# Patient Record
Sex: Male | Born: 1945 | Race: White | Hispanic: No | Smoking: Never smoker
Health system: Southern US, Community
[De-identification: ages and names within clinical notes are randomized; demographics above are authoritative.]

## PROBLEM LIST (undated history)

## (undated) DIAGNOSIS — Z950 Presence of cardiac pacemaker: Secondary | ICD-10-CM

## (undated) DIAGNOSIS — Z85828 Personal history of other malignant neoplasm of skin: Secondary | ICD-10-CM

## (undated) DIAGNOSIS — L299 Pruritus, unspecified: Secondary | ICD-10-CM

## (undated) DIAGNOSIS — H35039 Hypertensive retinopathy, unspecified eye: Secondary | ICD-10-CM

## (undated) DIAGNOSIS — E039 Hypothyroidism, unspecified: Secondary | ICD-10-CM

## (undated) DIAGNOSIS — B86 Scabies: Secondary | ICD-10-CM

## (undated) DIAGNOSIS — H269 Unspecified cataract: Secondary | ICD-10-CM

## (undated) HISTORY — DX: Hypothyroidism, unspecified: E03.9

## (undated) HISTORY — DX: Scabies: B86

## (undated) HISTORY — DX: Unspecified cataract: H26.9

## (undated) HISTORY — DX: Hypertensive retinopathy, unspecified eye: H35.039

## (undated) HISTORY — DX: Presence of cardiac pacemaker: Z95.0

## (undated) HISTORY — DX: Personal history of other malignant neoplasm of skin: Z85.828

## (undated) HISTORY — DX: Pruritus, unspecified: L29.9

---

## 2017-04-29 ENCOUNTER — Ambulatory Visit (INDEPENDENT_AMBULATORY_CARE_PROVIDER_SITE_OTHER): Payer: Medicare Other | Admitting: Student in an Organized Health Care Education/Training Program

## 2017-04-29 ENCOUNTER — Encounter: Payer: Self-pay | Admitting: Student in an Organized Health Care Education/Training Program

## 2017-04-29 VITALS — BP 98/68 | HR 96 | Temp 97.9°F | Ht 75.5 in | Wt 200.4 lb

## 2017-04-29 DIAGNOSIS — L299 Pruritus, unspecified: Secondary | ICD-10-CM

## 2017-04-29 DIAGNOSIS — E039 Hypothyroidism, unspecified: Secondary | ICD-10-CM

## 2017-04-29 DIAGNOSIS — Z85828 Personal history of other malignant neoplasm of skin: Secondary | ICD-10-CM | POA: Diagnosis not present

## 2017-04-29 DIAGNOSIS — Z7689 Persons encountering health services in other specified circumstances: Secondary | ICD-10-CM | POA: Diagnosis present

## 2017-04-29 DIAGNOSIS — Z950 Presence of cardiac pacemaker: Secondary | ICD-10-CM | POA: Diagnosis not present

## 2017-04-29 HISTORY — DX: Personal history of other malignant neoplasm of skin: Z85.828

## 2017-04-29 HISTORY — DX: Pruritus, unspecified: L29.9

## 2017-04-29 HISTORY — DX: Hypothyroidism, unspecified: E03.9

## 2017-04-29 HISTORY — DX: Presence of cardiac pacemaker: Z95.0

## 2017-04-29 MED ORDER — CETIRIZINE HCL 10 MG PO TABS: 10.0000 mg | ORAL_TABLET | Freq: Every day | ORAL | 11 refills | Status: DC

## 2017-04-29 NOTE — Assessment & Plan Note (Signed)
Patient follows with cardiology, now that he has moved it is reasonable to put in a referral so that he can establish care in [place].

## 2017-04-29 NOTE — Assessment & Plan Note (Signed)
Patient follows with dermatology, reasonable to establish care here in [place]. Referral was placed today.

## 2017-04-29 NOTE — Patient Instructions (Signed)
It was a pleasure seeing you today in our clinic. Today we discussed your medical history and generalized itching. Here is the treatment plan we have discussed and agreed upon together:  Your medications were entered into our system so please reach out when you need refills  A consult was placed to cardiology and dermatology at today's visit.  You will receive a call to schedule an appointment. If you do not receive a call within two weeks please call our office so we can place the consult again.  For itching, please avoid any products with a strong scent. Keep your skin hydrated with gentle unscented moisturizer such as cetaphil. I sent an antihistamine called Zyrtec to your pharmacy that may help with itching.  Our clinic's number is [phone]. Please call with questions or concerns about what we discussed today.  Be well, Dr. Mosetta PuttFeng

## 2017-04-29 NOTE — Assessment & Plan Note (Signed)
Uncertain etiology, may be dry skin or exposure to new city with new allergens. No scaling, no evidence of scabies. - Recommend unscented moisturizing lotion - Ordered Zyrtec to be sent to his pharmacy - Avoid strongly scented detergents, soaps, lotions which may be irritating to his skin - Follow-up as needed

## 2017-04-29 NOTE — Progress Notes (Signed)
CC: generalized itching  HPI: [name] [name] is a 71 y.o. male who presents to [facility] today as a new patient.  Patient notes generalized itching. It is primarily in his torso as well as his legs. He does not notice any rash on his skin. The itch is worse at night. He does not notice it during the day when he is up and walking around, and less he thinks about it. Currently has itching on his legs, but does not note any rash. This is been going on for 1 month. No notable new exposures, but he did [place] move to [place]. Denies changing detergents, lotions, soaps.   PMH: Skin cancer 2018 on right forehead >> follows with dermatology, requesting referral in the area Irregular heart beat with pacemaker in 2010 >> follows with cardiology once per year, last went 9 months ago requesting cardiology referral in the area HTN diagnosed in 2000   PSH: - Appendectomy 2005 - Pacemaker placed 2010  Family Hx: Mom and dad both had pancreatic cancer Grandparents with heart disease Brother with HTN  Social Hx: Retired Lives at home with his Dog called Romeo HCPOA Jairo BenJulie Ore >> daughter Exercises by walking and doing yoga Never smoker Denies Recreational drug use Drinks one drink per day, beer, wine or liquor Feels safe in relationships  Allergies: Penicillins and sulfa antibiotics   Meds: Pravastatin 40 mg daily Trospium Chloride 60 mg  Sertraline Hcl 150 mg daily ASA 81 mg BID Levothyroxine 100 mcg daily Vitamin D3 1000 IU daily Fish oil 1000 BID Multivitamin 1 daily Melatonin 10 mg daily  Overdue health maintenance  Reports Colonoscopy in 2008, normal Shingles vaccine in 2010 Last heart stress test and echo in 2010  Review of Symptoms:  See HPI for ROS.   CC, SH/smoking status, and VS noted.  Objective: BP 98/68   Pulse 96   Temp 97.9 F (36.6 C) (Oral)   Ht 6' 3.5" (1.918 m)   Wt 200 lb 6.4 oz (90.9 kg)   SpO2 96%   BMI 24.72 kg/m  GEN: NAD, alert, cooperative,  and pleasant. EYE: no conjunctival injection, pupils equally round and reactive to light ENMT: normal tympanic light reflex, no nasal polyps,no rhinorrhea, no pharyngeal erythema or exudates NECK: full ROM, no thyromegaly RESPIRATORY: clear to auscultation bilaterally with no wheezes, rhonchi or rales, good effort CV: RRR, no m/r/g, no peripheral edema GI: soft, non-tender, non-distended, no hepatosplenomegaly SKIN: warm and dry, no rashes or lesions NEURO: II-XII grossly intact, normal gait, peripheral sensation intact PSYCH: AAOx3, appropriate affect  Assessment and plan:  Itching Uncertain etiology, may be dry skin or exposure to new city with new allergens. No scaling, no evidence of scabies. - Recommend unscented moisturizing lotion - Ordered Zyrtec to be sent to his pharmacy - Avoid strongly scented detergents, soaps, lotions which may be irritating to his skin - Follow-up as needed  Cardiac pacemaker in situ Patient follows with cardiology, now that he has moved it is reasonable to put in a referral so that he can establish care in [place].  History of skin cancer Patient follows with dermatology, reasonable to establish care here in [place]. Referral was placed today.  Hypothyroidism Uncertain labs were previously done. Continue current dose of levothyroxine, and we'll try to obtain records from previous PCP.   Orders Placed This Encounter  Procedures  . Ambulatory referral to Cardiology    Referral Priority:   Routine    Referral Type:   Consultation  Referral Reason:   Specialty Services Required    Requested Specialty:   Cardiology    Number of Visits Requested:   1  . Ambulatory referral to Dermatology    Referral Priority:   Routine    Referral Type:   Consultation    Referral Reason:   Specialty Services Required    Requested Specialty:   Dermatology    Number of Visits Requested:   1    Meds ordered this encounter  Medications  . levothyroxine  (SYNTHROID, LEVOTHROID) 100 MCG tablet    Sig: Take 100 mcg daily by mouth.  . pravastatin (PRAVACHOL) 40 MG tablet    Sig: Take 40 mg daily by mouth.    Refill:  1  . sertraline (ZOLOFT) 100 MG tablet    Sig: Take 100 mg daily by mouth.  . Trospium Chloride 60 MG CP24    Sig: Take 1 capsule daily by mouth.    Refill:  2  . cetirizine (ZYRTEC) 10 MG tablet    Sig: Take 1 tablet (10 mg total) daily by mouth.    Dispense:  30 tablet    Refill:  11    Howard PouchLauren Latiesha Harada, MD,MS,  PGY2 04/29/2017 10:58 AM

## 2017-04-29 NOTE — Assessment & Plan Note (Signed)
Uncertain labs were previously done. Continue current dose of levothyroxine, and we'll try to obtain records from previous PCP.

## 2017-05-02 ENCOUNTER — Telehealth: Payer: Self-pay | Admitting: Student in an Organized Health Care Education/Training Program

## 2017-05-02 NOTE — Telephone Encounter (Signed)
Patient came to office was given RX for itching on last visit. Patient stated it has not helped & the itching is getting worse. Can something else be called in. [facility] on [address]. Please let patient know. [phone]

## 2017-05-05 NOTE — Telephone Encounter (Signed)
Diflucan sent [facility] for patient to pick up.

## 2017-05-06 NOTE — Telephone Encounter (Signed)
LVM for pt to call the office. If pt calls, please inform him of the message below. Sunday SpillersSharon T Saunders, CMA

## 2017-05-08 NOTE — Telephone Encounter (Signed)
I don't see where med were sent to pharmacy.  Will forward back to MD because when I called patient, he request that the meds be sent to [facility] on [address]. Gailya Tauer, Maryjo RochesterJessica Dawn, CMA

## 2017-05-08 NOTE — Telephone Encounter (Signed)
Previous note about diflucan was entered in error. Patient did not have a rash at all when seen in the office, so if allergy medication is not improving his symptoms he likely needs to be seen in clinic for worsening itchiness.

## 2017-05-13 NOTE — Telephone Encounter (Signed)
LVM for pt to call back to inform him that the medication that he was would be sent to pharmacy was entered in error and that if the allergy medication he was prescribed was not helping then he would need to make an appointment for worsening itchiness per Dr. Mosetta PuttFeng.  If he states that he needs an appointment please assist him scheduling this. [name] [name], April D, [place]

## 2017-05-14 NOTE — Telephone Encounter (Signed)
It appears that pt has an appointment scheduled for this on 05/20/17. [name] [name], [name] [name], [name] [name]

## 2017-05-20 ENCOUNTER — Ambulatory Visit (INDEPENDENT_AMBULATORY_CARE_PROVIDER_SITE_OTHER): Payer: Medicare Other | Admitting: Student in an Organized Health Care Education/Training Program

## 2017-05-20 VITALS — BP 100/70 | HR 70 | Temp 97.9°F | Wt 201.2 lb

## 2017-05-20 DIAGNOSIS — R21 Rash and other nonspecific skin eruption: Secondary | ICD-10-CM

## 2017-05-20 DIAGNOSIS — B86 Scabies: Secondary | ICD-10-CM | POA: Diagnosis not present

## 2017-05-20 MED ORDER — PERMETHRIN 5 % EX CREA: 1.0000 "application " | TOPICAL_CREAM | Freq: Once | CUTANEOUS | 0 refills | Status: AC

## 2017-05-20 MED ORDER — TRIAMCINOLONE ACETONIDE 0.1 % EX OINT: 1.0000 "application " | TOPICAL_OINTMENT | Freq: Two times a day (BID) | CUTANEOUS | 0 refills | Status: DC

## 2017-05-20 NOTE — Patient Instructions (Signed)
It was a pleasure seeing you today in our clinic. Today we discussed your itchy rash. Here is the treatment plan we have discussed and agreed upon together:  We will do a course of permethrin. You put the lotion all over your body from the neck down before bed. You wash it off in the morning. You can repeat the treatment in 7 days if symptoms persist.  Please wash your clothes and bed clothes. Items that cannot go into the wash must be placed in a garbage bag and set aside for 7 days so the mites will die.  If your symptoms persist let my office know and I will send an ointment for treating itchy skin. I would start by treating the possible scabies.  Our clinic's number is [phone]. Please call with questions or concerns about what we discussed today.  Be well, Dr. Mosetta PuttFeng   What is scabies? - Scabies is a condition that makes your skin very itchy. It happens when tiny insects called mites burrow under your skin to lay their eggs. How did I get scabies? - You probably caught scabies from someone else who has it. The condition spreads easily between people who are in close contact. It is also possible to catch scabies from the clothes of someone with the condition. Are there symptoms besides itching? - Yes. The main symptom is itching, but there are other symptoms. People with scabies usually get little red bumps or blisters on their skin (picture 1). Sometimes the bumps are hard to see. Some people even notice tiny tunnels in their skin where the mites have buried themselves. These are the body parts that are most often affected by scabies (figure 1): ?The fingers and webbing between the fingers ?The skin folds around the wrists, elbows, and knees ?The armpits ?The area around the nipples (especially in women) ?The waist ?The penis and scrotum (in men) ?The lower buttocks and upper thighs ?The sides and bottoms of the feet Should I see a doctor or nurse? - Yes, see your doctor or nurse. If  you have scabies, he or she can give you medicine to get rid of it. You can get infections if you keep scratching at it. How is scabies treated? - Your doctor or nurse can give you a prescription for a medicine that kills the mites that cause scabies. A medicine that is often used is called "permethrin" (brand names: Elimite, Acticin). Permethrin comes in a cream or lotion that you put on your skin. You must use the medicine exactly how the doctor or nurse tells you to. Otherwise it might not work. A medicine that comes in a pill, called "ivermectin," can also cure scabies. You need a prescription for this medicine. If you are being treated for scabies, the doctor or nurse will probably want the people who live with you to be treated, too. They might be carrying the mite that causes scabies, even if they have no symptoms. When you start treatment, wash all the clothes you and others in your home wore in the last 4 to 5 days in hot water. Then dry them in a dryer on high heat. You should also wash any bedclothes (sheets and blankets) or towels people in your home have touched. If your child is getting treated, wash his or her stuffed animals, too. Dry-cleaning will also get rid of the scabies mite. Any bedding, clothing, or towels that you cannot wash or dry-clean should be placed in a sealed plastic bag for  at least 3 days. Scabies mites usually die without contact with human skin after a few days. What can I do to stop the itching? - You can take medicines called antihistamines. These are the medicines people often take for allergies. Itching can last for several weeks, even after the scabies mite is gone. Your doctor or nurse might suggest you use a cream with a medicine called a steroid to help with the itching. (These are not the same as the steroids some athletes take illegally.) These steroids relieve itching and swelling. When can my child go back to school? - Your child can go back to school after  one day of treatment. Some people get a severe kind of scabies - People with HIV or AIDS, cancer, or other conditions can get a severe type of scabies called "crusted scabies." Crusted scabies causes large, crusty red patches or bumps to form on the skin. It spreads more easily than regular scabies. People with crusted scabies often get it on their head, hands, and feet. They sometimes need to take pills to get rid of scabies. More on this topic Patient education: Scabies (Beyond the Basics) All topics are updated as new evidence becomes available and our peer review process is complete.  This topic retrieved from UpToDate on: May 20, 2017.

## 2017-05-28 ENCOUNTER — Encounter: Payer: Self-pay | Admitting: Student in an Organized Health Care Education/Training Program

## 2017-05-28 DIAGNOSIS — B86 Scabies: Secondary | ICD-10-CM

## 2017-05-28 HISTORY — DX: Scabies: B86

## 2017-05-28 NOTE — Progress Notes (Signed)
   CC: pruritic rash  HPI: [name] [name] is a 71 y.o. male presenting with itchy rash.  Patient was seen in clinic previously for generalized itching of his skin, however no rash was appreciated at that time. He was thought to have allergies (recently moved to this area) and was treated with antihistamines. His pruritis persisted, and recently he noticed development of a rash prompting him to come back in for evaluation.  Today he is noted to have a puriritic rash with excoriations and borrows over his forearms and wrists. He denies changes in detergents, soaps or lotions. He denies any known new exposures such as medications. He denies recent hotel stays.  Review of Symptoms:  See HPI for ROS.   CC, SH/smoking status, and VS noted.  Objective: BP 100/70 (BP Location: Right Arm, Patient Position: Sitting, Cuff Size: Normal)   Pulse 70   Temp 97.9 F (36.6 C) (Oral)   Wt 201 lb 3.2 oz (91.3 kg)   SpO2 97%   BMI 24.82 kg/m  GEN: NAD, alert, cooperative, and pleasant. SKIN: Multiple excoriations with scabbing noted over forearms and wrists, not on trunk. Not on legs. Not on face.  Assessment and plan:  Scabies - Permetrhin - Kenolog ointment PRN itching - advised on appropriate washing of bed linens and clothes - advised against going to his massage today due to concern for infectivity - follow up if symptoms worsen or fail to improve - return precautions advised   No orders of the defined types were placed in this encounter.   Meds ordered this encounter  Medications  . permethrin (ELIMITE) 5 % cream    Sig: Apply 1 application topically once for 1 dose. Repeat in 7 days if symptoms persist.    Dispense:  60 g    Refill:  0  . triamcinolone ointment (KENALOG) 0.1 %    Sig: Apply 1 application topically 2 (two) times daily. As needed for itchy skin    Dispense:  15 g    Refill:  0     Howard PouchLauren Yazmyne Sara, MD,MS,  PGY2 05/28/2017 1:37 PM

## 2017-05-28 NOTE — Assessment & Plan Note (Signed)
-   Permetrhin - Kenolog ointment PRN itching - advised on appropriate washing of bed linens and clothes - advised against going to his massage today due to concern for infectivity - follow up if symptoms worsen or fail to improve - return precautions advised

## 2017-06-03 ENCOUNTER — Ambulatory Visit (INDEPENDENT_AMBULATORY_CARE_PROVIDER_SITE_OTHER): Payer: Medicare Other | Admitting: Student in an Organized Health Care Education/Training Program

## 2017-06-03 ENCOUNTER — Other Ambulatory Visit: Payer: Self-pay | Admitting: Student in an Organized Health Care Education/Training Program

## 2017-06-03 ENCOUNTER — Encounter: Payer: Self-pay | Admitting: Student in an Organized Health Care Education/Training Program

## 2017-06-03 ENCOUNTER — Other Ambulatory Visit: Payer: Self-pay

## 2017-06-03 VITALS — BP 120/76 | HR 79 | Temp 98.1°F | Ht 75.5 in | Wt 199.8 lb

## 2017-06-03 DIAGNOSIS — R21 Rash and other nonspecific skin eruption: Secondary | ICD-10-CM | POA: Diagnosis not present

## 2017-06-03 DIAGNOSIS — L299 Pruritus, unspecified: Secondary | ICD-10-CM | POA: Diagnosis not present

## 2017-06-03 DIAGNOSIS — L280 Lichen simplex chronicus: Secondary | ICD-10-CM

## 2017-06-03 MED ORDER — HYDROXYZINE HCL 10 MG PO TABS: 10.0000 mg | ORAL_TABLET | Freq: Every evening | ORAL | 0 refills | Status: DC | PRN

## 2017-06-03 MED ORDER — TRIAMCINOLONE ACETONIDE 0.1 % EX OINT: 1.0000 "application " | TOPICAL_OINTMENT | Freq: Two times a day (BID) | CUTANEOUS | 0 refills | Status: DC

## 2017-06-03 NOTE — Patient Instructions (Addendum)
It was a pleasure seeing you today in our clinic. Today we discussed your itchy rash. Here is the treatment plan we have discussed and agreed upon together:  - Please use Zyrtec during the day -  I prescribed another medication to use as needed for itching called Atarax, to be used at night -  you can use a humidifier to help with itching at nighttime - I sent the ointment to your pharmacy as well which may help with itching   Our clinic's number is [phone]. Please call with questions or concerns about what we discussed today.  Be well, Dr. Mosetta PuttFeng

## 2017-06-03 NOTE — Progress Notes (Signed)
   CC: Pruritic rash  HPI: [name] [name] is a 71 y.o. male with PMH significant for recent treatment for scabies who [facility] with pruritus.  Patient was seen about 2 weeks ago and diagnosed with scabies at which time he was treated with permethrin and Kenalog ointment. Since that time patient has continued to have itching, feels that the Kenalog is not doing anything. The itching is now on his back bilateral arms and left outer upper thigh and right outer upper thigh. Patient notes that he has been very nervous that the scabies is not been eradicated. He has been cleaning everything in his house. He has been using tea tree oil and vinegar on his skin because he read online that this may help get rid of scabies further.   Review of Symptoms:  See HPI for ROS.   CC, SH/smoking status, and VS noted.  Objective: BP 120/76   Pulse 79   Temp 98.1 F (36.7 C) (Oral)   Ht 6' 3.5" (1.918 m)   Wt 199 lb 12.8 oz (90.6 kg)   SpO2 95%   BMI 24.64 kg/m  GEN: NAD, alert, cooperative, and pleasant. SKIN:   +irritation and erythema noted over bilateral forearms, lateral upper legs, and back in areas he is able to reach. No papules or pustules. No drainage. No skin breakdown  Assessment and plan:  Itching Most consistent with neurodermatitis. Seems that patient has been scratching his skin persistently since having scabies and has ongoing fears that he still has scabies. Likely scratching is causing itch/causing rash. Rash is only present in places he can reach. - zyrtec, atarax at night - kenalog ointment - recommend humidified air - will check LFTs, very small chance there is liver cause for generalized itching, though seems unlikely - reassured patient his rash no longer appears to be scabies   Orders Placed This Encounter  Procedures  . Hepatic Function Panel    Meds ordered this encounter  Medications  . hydrOXYzine (ATARAX/VISTARIL) 10 MG tablet    Sig: Take 1 tablet  (10 mg total) by mouth at bedtime as needed for itching.    Dispense:  30 tablet    Refill:  0  . triamcinolone ointment (KENALOG) 0.1 %    Sig: Apply 1 application topically 2 (two) times daily. As needed for itchy skin    Dispense:  15 g    Refill:  0     Howard PouchLauren Mattye Verdone, MD,MS,  PGY2 06/04/2017 9:33 AM

## 2017-06-04 ENCOUNTER — Encounter: Payer: Self-pay | Admitting: Internal Medicine

## 2017-06-04 ENCOUNTER — Encounter: Payer: Self-pay | Admitting: Student in an Organized Health Care Education/Training Program

## 2017-06-04 ENCOUNTER — Telehealth: Payer: Self-pay

## 2017-06-04 ENCOUNTER — Ambulatory Visit (INDEPENDENT_AMBULATORY_CARE_PROVIDER_SITE_OTHER): Payer: Medicare Other | Admitting: Internal Medicine

## 2017-06-04 VITALS — BP 122/68 | HR 60 | Ht 75.0 in | Wt 200.0 lb

## 2017-06-04 DIAGNOSIS — I442 Atrioventricular block, complete: Secondary | ICD-10-CM

## 2017-06-04 DIAGNOSIS — Z950 Presence of cardiac pacemaker: Secondary | ICD-10-CM

## 2017-06-04 LAB — HEPATIC FUNCTION PANEL
ALBUMIN: 4.4 g/dL (ref 3.5–4.8)
ALT: 21 IU/L (ref 0–44)
AST: 18 IU/L (ref 0–40)
Alkaline Phosphatase: 74 IU/L (ref 39–117)
BILIRUBIN TOTAL: 0.3 mg/dL (ref 0.0–1.2)
BILIRUBIN, DIRECT: 0.11 mg/dL (ref 0.00–0.40)
Total Protein: 6.6 g/dL (ref 6.0–8.5)

## 2017-06-04 NOTE — Patient Instructions (Signed)
Medication Instructions:  Your physician recommends that you continue on your current medications as directed. Please refer to the Current Medication list given to you today.  Labwork: None ordered.  Testing/Procedures: None ordered.  Follow-Up: Your physician wants you to follow-up in: one year with Dr. Ladona Ridgelaylor.   You will receive a reminder letter in the mail two months in advance. If you don't receive a letter, please call our office to schedule the follow-up appointment.  Remote monitoring is used to monitor your Pacemaker from home. This monitoring reduces the number of office visits required to check your device to one time per year. It allows us to keep an eye on the functioning of your device to ensure it is working properly. You are scheduled for a device check from home on 09/03/2017. You may send your transmission at any time that day. If you have a wireless device, the transmission will be sent automatically. After your physician reviews your transmission, you will receive a postcard with your next transmission date.    Any Other Special Instructions Will Be Listed Below (If Applicable).     If you need a refill on your cardiac medications before your next appointment, please call your pharmacy.

## 2017-06-04 NOTE — Telephone Encounter (Signed)
-----   Message from Howard PouchLauren Feng, MD sent at 06/04/2017  9:22 AM EST ----- Please call and let the patient know his liver function studies were completely normal.

## 2017-06-04 NOTE — Telephone Encounter (Signed)
PCLVM for pt to call the office. If he calls, please give him the information below. Sunday SpillersSharon T Rabiah Goeser, CMA

## 2017-06-04 NOTE — Assessment & Plan Note (Signed)
Most consistent with neurodermatitis. Seems that patient has been scratching his skin persistently since having scabies and has ongoing fears that he still has scabies. Likely scratching is causing itch/causing rash. Rash is only present in places he can reach. - zyrtec, atarax at night - kenalog ointment - recommend humidified air - will check LFTs, very small chance there is liver cause for generalized itching, though seems unlikely - reassured patient his rash no longer appears to be scabies

## 2017-06-04 NOTE — Progress Notes (Signed)
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 Mosetta PuttFeng. 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 He has moved to [place] to be closer to his daughter and grandchildren. He denies chest pain, sob, or peripheral edema. No syncope. Allergies  Allergen Reactions  . Penicillins   . Sulfa Antibiotics      Current Outpatient Medications  Medication Sig Dispense Refill  . cetirizine (ZYRTEC) 10 MG tablet Take 1 tablet (10 mg total) daily by mouth. 30 tablet 11  . hydrOXYzine (ATARAX/VISTARIL) 10 MG tablet Take 1 tablet (10 mg total) by mouth at bedtime as needed for itching. 30 tablet 0  . levothyroxine (SYNTHROID, LEVOTHROID) 100 MCG tablet Take 100 mcg daily by mouth.    . pravastatin (PRAVACHOL) 40 MG tablet Take 40 mg daily by mouth.  1  . sertraline (ZOLOFT) 100 MG tablet Take by mouth. TAKE 1 1/2 TABLET BY MOUTH ONE TIME DAILY (150 MG TOTAL)    . triamcinolone ointment (KENALOG) 0.1 % Apply 1 application topically 2 (two) times daily. As needed for itchy skin 15 g 0  . triamcinolone ointment (KENALOG) 0.1 % APPLY EXTERNALLY TO THE AFFECTED AREA TWICE DAILY AS NEEDED FOR ITCHY SKIN 15 g 0   No current facility-administered medications for this visit.      Past Medical History:  Diagnosis Date  . 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 History of skin cancer 04/29/2017  . Hypothyroidism 04/29/2017  . Itching 04/29/2017  . Scabies 05/28/2017    ROS:   All systems reviewed and negative except as noted in the HPI.   No past surgical history on file.   Family History  Problem Relation Age of Onset  . Hypertension Brother      Social History   Socioeconomic History  . Marital status: Divorced    Spouse name: Not on file  . Number of children: Not on file  . Years of education: Not on file  . Highest education level: Not on file    Social Needs  . Financial resource strain: Not on file  . Food insecurity - worry: Not on file  . Food insecurity - inability: Not on file  . Transportation needs - medical: Not on file  . Transportation needs - non-medical: Not on file  Occupational History  . Not on file  Tobacco Use  . Smoking status: Never Smoker  . Smokeless tobacco: Never Used  Substance and Sexual Activity  . Alcohol use: Not on file  . Drug use: Not on file  . Sexual activity: Not on file  Other Topics Concern  . Not on file  Social History Narrative  . Not on file     BP 122/68   Pulse 60   Ht 6\' 3"  (1.905 m)   Wt 200 lb (90.7 kg)   SpO2 97%   BMI 25.00 kg/m   Physical Exam:  Well appearing 71 yo man, NAD HEENT: Unremarkable Neck:  6 cm JVD, no thyromegally Lymphatics:  No adenopathy Back:  No CVA tenderness Lungs:  Clear with no wheezes HEART:  Regular rate rhythm, no murmurs, no rubs, no clicks Abd:  soft, positive bowel sounds, no organomegally, no rebound, no guarding Ext:  2 plus pulses, no edema, no cyanosis, no clubbing Skin:  No rashes no nodules Neuro:  CN II through XII intact, motor grossly intact  EKG - NSR with AV pacing  DEVICE  Normal device function.  See PaceArt for details.   Assess/Plan: 1. PPM - his St. Jude DDD PM is working normally. Will recheck in several months. 2. CHB - he has no escape rhythm at 30/min. He is asymptomatic s/p PPM. 3. Dyslipidemia - he will continue his statin therapy.  Leonia ReevesGregg Arhianna Ebey,M.D.

## 2017-06-05 NOTE — Telephone Encounter (Signed)
LVM for pt to call back to inform them of below. Zimmerman Rumple, Darcey Demma D, CMA  

## 2017-07-03 ENCOUNTER — Other Ambulatory Visit: Payer: Self-pay | Admitting: Student in an Organized Health Care Education/Training Program

## 2017-07-03 ENCOUNTER — Encounter: Payer: Self-pay | Admitting: Student in an Organized Health Care Education/Training Program

## 2017-07-03 ENCOUNTER — Other Ambulatory Visit: Payer: Self-pay

## 2017-07-03 ENCOUNTER — Ambulatory Visit (INDEPENDENT_AMBULATORY_CARE_PROVIDER_SITE_OTHER): Payer: Medicare Other | Admitting: Student in an Organized Health Care Education/Training Program

## 2017-07-03 DIAGNOSIS — Z207 Contact with and (suspected) exposure to pediculosis, acariasis and other infestations: Secondary | ICD-10-CM | POA: Insufficient documentation

## 2017-07-03 DIAGNOSIS — Z2089 Contact with and (suspected) exposure to other communicable diseases: Secondary | ICD-10-CM | POA: Diagnosis present

## 2017-07-03 MED ORDER — PERMETHRIN 5 % EX CREA: 1.0000 "application " | TOPICAL_CREAM | Freq: Once | CUTANEOUS | 0 refills | Status: AC

## 2017-07-03 NOTE — Patient Instructions (Signed)
It was a pleasure seeing you today in our clinic. Today we discussed possible exposure to scabies. Here is the treatment plan we have discussed and agreed upon together:  If your friend is diagnosed with scabies, please call our office back and I will call exposure prophylactic permethrin into your pharmacy.  Our clinic's number is [phone]. Please call with questions or concerns about what we discussed today.  Be well, Dr. Mosetta PuttFeng

## 2017-07-03 NOTE — Progress Notes (Signed)
   CC: scabies exposure   HPI: [name] KillGeorge P [name] is a 72 y.o. male who presents after exposure to scabies.  He reports that prior to being treated for scabies several weeks ago he was sexually active and his partner has recently developed symptoms of scabies. He reports that while he is in the office today his partner is at the doctor for scabies symptoms.  The patient reports he would like post-exposure prophylaxis for scabies. Initially the plan was to wait to see if the partner is treated for scabies prior to prescribing permethrin. The patient sent me an ichart message shortly after his appointment stating that his partner is infact being treated for scabies.  Patient is asymptomatic.  Review of Symptoms:  See HPI for ROS.   CC, SH/smoking status, and VS noted.  Objective: BP 134/80 (BP Location: Left Arm, Patient Position: Sitting, Cuff Size: Large)   Pulse 77   Temp 98.3 F (36.8 C) (Oral)   Ht 6\' 3"  (1.905 m)   Wt 205 lb (93 kg)   SpO2 99%   BMI 25.62 kg/m  GEN: NAD, alert, cooperative, and pleasant SKIN: warm and dry, no rashes or lesions PSYCH: AAOx3, appropriate affect  Assessment and plan:  Exposure to scabies Because partner was exposed and is being treated for scabies, reasonable to send 5% permethrin to pharmacy for this patient. Cleaning of clothes and bedclothes discussed. Follow up as needed   Howard PouchLauren Moksh Loomer, MD,MS,  PGY2 07/03/2017 4:50 PM

## 2017-07-03 NOTE — Assessment & Plan Note (Signed)
Because partner was exposed and is being treated for scabies, reasonable to send 5% permethrin to pharmacy for this patient. Cleaning of clothes and bedclothes discussed. Follow up as needed

## 2017-07-03 NOTE — Progress Notes (Signed)
Patient called to say his friend does have scabies which he was exposed to. Asks for post-exposure prophylaxis. Sent to pharmacy.

## 2017-07-22 ENCOUNTER — Other Ambulatory Visit: Payer: Self-pay | Admitting: Student in an Organized Health Care Education/Training Program

## 2017-08-07 ENCOUNTER — Other Ambulatory Visit: Payer: Self-pay | Admitting: Student in an Organized Health Care Education/Training Program

## 2017-08-13 ENCOUNTER — Telehealth: Payer: Self-pay

## 2017-08-13 NOTE — Telephone Encounter (Signed)
Left message for pt to call device clinic back regarding [facility] ( attempted to contact the number listed in [facility] transfer request but was not the right number for a cardiology office)

## 2017-08-14 ENCOUNTER — Encounter: Payer: Self-pay | Admitting: Student in an Organized Health Care Education/Training Program

## 2017-09-03 ENCOUNTER — Telehealth: Payer: Self-pay

## 2017-09-03 ENCOUNTER — Encounter: Payer: Medicare Other | Admitting: *Deleted

## 2017-09-03 NOTE — Telephone Encounter (Signed)
LVM informing pt that I had spoke with [employer] and had ordered him a new monitor and he should receive it in 7-10 business days. Left device clinic number if he has any questions setting it up once monitor arrives.

## 2017-10-07 ENCOUNTER — Ambulatory Visit (INDEPENDENT_AMBULATORY_CARE_PROVIDER_SITE_OTHER): Payer: Medicare Other | Admitting: *Deleted

## 2017-10-07 DIAGNOSIS — I442 Atrioventricular block, complete: Secondary | ICD-10-CM | POA: Diagnosis not present

## 2017-10-07 NOTE — Progress Notes (Signed)
Remote pacemaker transmission.   

## 2017-10-09 ENCOUNTER — Encounter: Payer: Self-pay | Admitting: Cardiology

## 2017-10-13 LAB — CUP PACEART REMOTE DEVICE CHECK
Battery Remaining Longevity: 32 mo
Battery Remaining Percentage: 25 %
Battery Voltage: 2.8 V
Brady Statistic AP VS Percent: 1 %
Brady Statistic RA Percent Paced: 30 %
Date Time Interrogation Session: 20190416060338
Implantable Lead Implant Date: 20101229
Implantable Lead Location: 753859
Implantable Lead Model: 1948
Implantable Pulse Generator Implant Date: 20101229
Lead Channel Impedance Value: 390 Ohm
Lead Channel Pacing Threshold Amplitude: 0.75 V
Lead Channel Pacing Threshold Pulse Width: 0.4 ms
Lead Channel Pacing Threshold Pulse Width: 0.4 ms
Lead Channel Sensing Intrinsic Amplitude: 2.1 mV
Lead Channel Setting Sensing Sensitivity: 0.5 mV
MDC IDC LEAD IMPLANT DT: 20101229
MDC IDC LEAD LOCATION: 753860
MDC IDC MSMT LEADCHNL RV IMPEDANCE VALUE: 550 Ohm
MDC IDC MSMT LEADCHNL RV PACING THRESHOLD AMPLITUDE: 0.75 V
MDC IDC MSMT LEADCHNL RV SENSING INTR AMPL: 10.9 mV
MDC IDC SET LEADCHNL RA PACING AMPLITUDE: 1.75 V
MDC IDC SET LEADCHNL RV PACING AMPLITUDE: 1 V
MDC IDC SET LEADCHNL RV PACING PULSEWIDTH: 0.4 ms
MDC IDC STAT BRADY AP VP PERCENT: 30 %
MDC IDC STAT BRADY AS VP PERCENT: 70 %
MDC IDC STAT BRADY AS VS PERCENT: 1 %
MDC IDC STAT BRADY RV PERCENT PACED: 99 %
Pulse Gen Model: 2110
Pulse Gen Serial Number: 7083047

## 2017-10-23 ENCOUNTER — Ambulatory Visit (INDEPENDENT_AMBULATORY_CARE_PROVIDER_SITE_OTHER): Payer: Medicare Other | Admitting: Internal Medicine

## 2017-10-23 ENCOUNTER — Encounter: Payer: Self-pay | Admitting: Internal Medicine

## 2017-10-23 ENCOUNTER — Other Ambulatory Visit (HOSPITAL_COMMUNITY)
Admission: RE | Admit: 2017-10-23 | Discharge: 2017-10-23 | Disposition: A | Discharge status: Home / Self Care | Payer: Medicare Other | Source: Ambulatory Visit | Attending: Family Medicine | Admitting: Family Medicine

## 2017-10-23 VITALS — BP 130/68 | HR 73 | Temp 98.3°F | Ht 75.0 in | Wt 205.6 lb

## 2017-10-23 DIAGNOSIS — R5383 Other fatigue: Secondary | ICD-10-CM | POA: Diagnosis not present

## 2017-10-23 DIAGNOSIS — Z1211 Encounter for screening for malignant neoplasm of colon: Secondary | ICD-10-CM | POA: Diagnosis not present

## 2017-10-23 DIAGNOSIS — R319 Hematuria, unspecified: Secondary | ICD-10-CM | POA: Diagnosis present

## 2017-10-23 DIAGNOSIS — R82998 Other abnormal findings in urine: Secondary | ICD-10-CM | POA: Diagnosis not present

## 2017-10-23 DIAGNOSIS — R195 Other fecal abnormalities: Secondary | ICD-10-CM | POA: Diagnosis not present

## 2017-10-23 DIAGNOSIS — L299 Pruritus, unspecified: Secondary | ICD-10-CM | POA: Diagnosis present

## 2017-10-23 DIAGNOSIS — K921 Melena: Secondary | ICD-10-CM

## 2017-10-23 LAB — POCT URINALYSIS DIP (MANUAL ENTRY)
Bilirubin, UA: NEGATIVE
Blood, UA: NEGATIVE
Glucose, UA: NEGATIVE mg/dL
LEUKOCYTES UA: NEGATIVE
NITRITE UA: NEGATIVE
PH UA: 5.5 (ref 5.0–8.0)
PROTEIN UA: NEGATIVE mg/dL
Spec Grav, UA: 1.025 (ref 1.010–1.025)
Urobilinogen, UA: 0.2 E.U./dL

## 2017-10-23 LAB — HEMOCCULT GUIAC POC 1CARD (OFFICE): FECAL OCCULT BLD: NEGATIVE

## 2017-10-23 MED ORDER — PERMETHRIN 5 % EX CREA: 1.0000 "application " | TOPICAL_CREAM | Freq: Once | CUTANEOUS | 0 refills | Status: AC

## 2017-10-23 MED ORDER — RANITIDINE HCL 150 MG PO TABS: 150.0000 mg | ORAL_TABLET | Freq: Two times a day (BID) | ORAL | 1 refills | Status: DC

## 2017-10-23 NOTE — Patient Instructions (Signed)
[name] [name],  Please try zantac twice daily with zyrtec at night for ongoing itching. Try eucerin, cetaphil, or cerave creams for moisturizing twice daily.  For blood in stool and urine, we are going to try to rule out infection. I will refer you for colonoscopy.  We do need a urine sample 1 hour from your last void. I will place a future lab order for this.  I will give you a call with lab results.  Best, Dr. Sampson Goon

## 2017-10-24 ENCOUNTER — Other Ambulatory Visit: Payer: Self-pay | Admitting: Student in an Organized Health Care Education/Training Program

## 2017-10-24 LAB — URINE CYTOLOGY ANCILLARY ONLY
CHLAMYDIA, DNA PROBE: NEGATIVE
NEISSERIA GONORRHEA: NEGATIVE

## 2017-10-26 ENCOUNTER — Encounter: Payer: Self-pay | Admitting: Internal Medicine

## 2017-10-26 DIAGNOSIS — R82998 Other abnormal findings in urine: Secondary | ICD-10-CM | POA: Insufficient documentation

## 2017-10-26 DIAGNOSIS — R195 Other fecal abnormalities: Secondary | ICD-10-CM | POA: Insufficient documentation

## 2017-10-26 NOTE — Progress Notes (Signed)
[facility] Progress Note  Subjective:  [name] [name] is a 72 y.o. male with history of irregular heart beat s/p pacemaker, speech impediment, history of skin cancer, and HLD who presents for continued itching and concern for blood in urine and stool.  #Itching: - Has been seen multiple times for itching. Has been treated twice with permethrin for scabies exposure. - Itching is mostly on his buttocks and bothers him most at night - Reports washing clothes and sheets as instructed after most recent scabies treatment - Has seen a dermatologist but was not itching at time of visit and just had skin cancer screening - Some improvement with moisturizing with hand cream - Did not have much improvement with zyrtec  #Possible blood in urine and stool: - Reports a few days of red/pink urine last week. Had only one episode of a little discomfort with urination. - Notes stools have looked bloody. Denies black or dark stools. - Does say he stayed with a [place] family on a trip in [place] last week and ate a lot of beets - Has not been sexually active in about 9 months. Identifies as gay and has sex with men.  - May be a little more tired than usual but attributes this to recent travel. - Says he has not established with a gastroenterologist in town yet and is due for colonoscopy but denies abnormal colonoscopies in the past ROS: No fevers, no diarrhea  Allergies  Allergen Reactions  . Penicillins   . Sulfa Antibiotics     Social History   Tobacco Use  . Smoking status: Never Smoker  . Smokeless tobacco: Never Used  Substance Use Topics  . Alcohol use: Yes    Objective: Blood pressure 130/68, pulse 73, temperature 98.3 F (36.8 C), temperature source Oral, height  (1.905 m), weight 205 lb 9.6 oz (93.3 kg), SpO2 96 %. Body mass index is 25.7 kg/m. Constitutional: Pleasant male in NAD Abdominal: Soft. +BS, NT GU: Chaperone present. No fissures or hemorrhoids noted  on rectal exam. Stool on glove appears red-tinted.  Skin: Flesh-colored, dry papules across buttocks.  Psychiatric: Normal mood and affect.  Vitals reviewed  Assessment/Plan: Itching - Papules on buttocks could represent ongoing scabies infection versus dry skin dermatitis. Recommended another round of permethrin given patient's concerns of spreading to partner. Recommended aggressive moisturization thereafter with eucerin, cetaphil, or cerave creams.  - Ordered zantac for patient to take BID along with zyrtec to help with itching.   Red-colored urine - UA negative for blood or evidence of UTI. No history of kidney stones and no abdominal or groin pain. Suspect 2/2 beet ingestion. - Gonorrhea/chlamydia screening obtained given isolated episode of dysuria. - Counseled patient to watch color of his urine; would obtain another UA if any change in urine color and refer to urology if blood present  Red stool - FOBT negative. Suspect 2/2 recent beet ingestion.  - Will refer to GI for regular screening colonoscopy   Follow-up prn.  Dani Gobble, MD [facility], PGY-3

## 2017-10-26 NOTE — Assessment & Plan Note (Signed)
-   FOBT negative. Suspect 2/2 recent beet ingestion.  - Will refer to GI for regular screening colonoscopy

## 2017-10-26 NOTE — Assessment & Plan Note (Signed)
-   UA negative for blood or evidence of UTI. No history of kidney stones and no abdominal or groin pain. Suspect 2/2 beet ingestion. - Gonorrhea/chlamydia screening obtained given isolated episode of dysuria. - Counseled patient to watch color of his urine; would obtain another UA if any change in urine color and refer to urology if blood present

## 2017-10-26 NOTE — Assessment & Plan Note (Signed)
-   Papules on buttocks could represent ongoing scabies infection versus dry skin dermatitis. Recommended another round of permethrin given patient's concerns of spreading to partner. Recommended aggressive moisturization thereafter with eucerin, cetaphil, or cerave creams.  - Ordered zantac for patient to take BID along with zyrtec to help with itching.

## 2017-10-27 ENCOUNTER — Telehealth: Payer: Self-pay | Admitting: *Deleted

## 2017-10-27 NOTE — Telephone Encounter (Signed)
-----   Message from [facility], MD sent at 10/27/2017  8:05 AM EDT ----- Please let patient know the rest of his lab testing was negative (gonorrhea/chlamydia).

## 2017-10-27 NOTE — Telephone Encounter (Signed)
LMOVM letting pt know that results were normal and to call us back if he had any questions. Deseree Bruna Potter, CMA

## 2017-11-03 ENCOUNTER — Other Ambulatory Visit: Payer: Self-pay | Admitting: Student in an Organized Health Care Education/Training Program

## 2018-01-06 ENCOUNTER — Encounter: Payer: Self-pay | Admitting: Student in an Organized Health Care Education/Training Program

## 2018-01-06 ENCOUNTER — Ambulatory Visit (INDEPENDENT_AMBULATORY_CARE_PROVIDER_SITE_OTHER): Payer: Medicare Other | Admitting: *Deleted

## 2018-01-06 DIAGNOSIS — I442 Atrioventricular block, complete: Secondary | ICD-10-CM

## 2018-01-06 NOTE — Progress Notes (Signed)
Remote pacemaker transmission.   

## 2018-01-07 ENCOUNTER — Encounter: Payer: Self-pay | Admitting: Cardiology

## 2018-01-17 LAB — CUP PACEART REMOTE DEVICE CHECK
Battery Remaining Longevity: 29 mo
Battery Voltage: 2.78 V
Brady Statistic AP VP Percent: 31 %
Brady Statistic AS VS Percent: 1 %
Brady Statistic RA Percent Paced: 31 %
Date Time Interrogation Session: 20190716063835
Implantable Lead Implant Date: 20101229
Implantable Lead Implant Date: 20101229
Implantable Lead Location: 753859
Implantable Pulse Generator Implant Date: 20101229
Lead Channel Impedance Value: 390 Ohm
Lead Channel Impedance Value: 540 Ohm
Lead Channel Pacing Threshold Amplitude: 0.625 V
Lead Channel Pacing Threshold Amplitude: 0.625 V
Lead Channel Pacing Threshold Pulse Width: 0.4 ms
Lead Channel Pacing Threshold Pulse Width: 0.4 ms
Lead Channel Sensing Intrinsic Amplitude: 2.5 mV
Lead Channel Setting Pacing Amplitude: 0.875
MDC IDC LEAD LOCATION: 753860
MDC IDC MSMT BATTERY REMAINING PERCENTAGE: 22 %
MDC IDC MSMT LEADCHNL RV SENSING INTR AMPL: 12 mV
MDC IDC PG SERIAL: 7083047
MDC IDC SET LEADCHNL RA PACING AMPLITUDE: 1.625
MDC IDC SET LEADCHNL RV PACING PULSEWIDTH: 0.4 ms
MDC IDC SET LEADCHNL RV SENSING SENSITIVITY: 0.5 mV
MDC IDC STAT BRADY AP VS PERCENT: 1 %
MDC IDC STAT BRADY AS VP PERCENT: 69 %
MDC IDC STAT BRADY RV PERCENT PACED: 99 %
Pulse Gen Model: 2110

## 2018-01-24 ENCOUNTER — Other Ambulatory Visit: Payer: Self-pay | Admitting: Student in an Organized Health Care Education/Training Program

## 2018-01-26 ENCOUNTER — Other Ambulatory Visit: Payer: Self-pay | Admitting: Student in an Organized Health Care Education/Training Program

## 2018-02-03 ENCOUNTER — Other Ambulatory Visit: Payer: Self-pay | Admitting: Student in an Organized Health Care Education/Training Program

## 2018-02-10 ENCOUNTER — Encounter: Payer: Self-pay | Admitting: Student in an Organized Health Care Education/Training Program

## 2018-02-10 ENCOUNTER — Other Ambulatory Visit: Payer: Self-pay | Admitting: Student in an Organized Health Care Education/Training Program

## 2018-02-10 MED ORDER — SERTRALINE HCL 100 MG PO TABS: 150.0000 mg | ORAL_TABLET | Freq: Every day | ORAL | 0 refills | Status: AC

## 2018-03-30 ENCOUNTER — Other Ambulatory Visit: Payer: Self-pay | Admitting: Student in an Organized Health Care Education/Training Program

## 2018-04-01 ENCOUNTER — Other Ambulatory Visit: Payer: Self-pay

## 2018-04-01 ENCOUNTER — Encounter: Payer: Self-pay | Admitting: Student in an Organized Health Care Education/Training Program

## 2018-04-01 ENCOUNTER — Ambulatory Visit (INDEPENDENT_AMBULATORY_CARE_PROVIDER_SITE_OTHER): Payer: Medicare Other | Admitting: Licensed Clinical Social Worker

## 2018-04-01 ENCOUNTER — Ambulatory Visit (INDEPENDENT_AMBULATORY_CARE_PROVIDER_SITE_OTHER): Payer: Medicare Other | Admitting: Student in an Organized Health Care Education/Training Program

## 2018-04-01 VITALS — BP 120/74 | HR 68 | Temp 98.5°F | Wt 203.4 lb

## 2018-04-01 DIAGNOSIS — Z85828 Personal history of other malignant neoplasm of skin: Secondary | ICD-10-CM | POA: Diagnosis not present

## 2018-04-01 DIAGNOSIS — L989 Disorder of the skin and subcutaneous tissue, unspecified: Secondary | ICD-10-CM

## 2018-04-01 DIAGNOSIS — R4589 Other symptoms and signs involving emotional state: Secondary | ICD-10-CM

## 2018-04-01 DIAGNOSIS — Z23 Encounter for immunization: Secondary | ICD-10-CM | POA: Diagnosis not present

## 2018-04-01 DIAGNOSIS — F329 Major depressive disorder, single episode, unspecified: Secondary | ICD-10-CM

## 2018-04-01 NOTE — Progress Notes (Signed)
CC: Depression, skin lesion  HPI: [name] is a 72 y.o. male with PMH significant for depression, hypothyroidism, skin cancer who presents to [facility] today with depressed mood.   Depressed mood Patient reports he has had worsening of depressed mood over the past 1-2 months. He reports he has a long history with depression, and was actually diagnosed as an adolescent. He has had "flares" of his depression in the past, particularly after he ended his marriage of 30 years after coming out as a homosexual, and then again after his partner of 15 years passed away.  For the past 18 months he has been living on his own for the very first time and he has felt this has affected his mood.  Today, he reports that he feels his depressed mood is related to a recent relationship issue 1-2 months ago. He reports feelings of sadness, guilt, and difficulty concentrating. He circles "thoughts that I'd be better off dead" on his PHQ9, however clarifies that he has no suicidal ideation and no plan, he just sometimes questions his purpose for being alive.  When questioned, he cites protective factors such as his children and grandson as his reasons for living.  He reports that with his recent depressed mood he is still able to function somewhat normally, but he does take longer in the morning to get ready for his day. Patient has been taking 100 mg zoloft for "a long time" without any issues with this medicine. He feels his mood was previously stable on this medicine.  Skin lesion Patient has a nevus on his right shoulder which he reports is been changing.  Nevus has been growing in size.  Also has dry skin noted over the top of it which is cracking.  It is not been bleeding or itching.  He does have a history of skin cancer in the past.  Review of Symptoms:  See HPI for ROS.   CC, SH/smoking status, and VS noted.  Objective: BP 120/74   Pulse 68   Temp 98.5 F (36.9 C) (Oral)   Wt 203 lb 6.4 oz (92.3 kg)    SpO2 97%   BMI 25.42 kg/m  GEN: NAD, alert, cooperative, and pleasant.  Significant stutter EYE: no conjunctival injection, pupils equally round and reactive to light SKIN: 1 cm raised lesion with blue base and crusting exterior noted on the right shoulder.  No surrounding erythema or warmth. NEURO: II-XII grossly intact, normal gait, peripheral sensation intact PSYCH: AAOx3, appropriate affect Depression screen [facility] 2/9 04/01/2018 10/23/2017 07/03/2017  Decreased Interest 3 0 0  Down, Depressed, Hopeless 3 0 0  PHQ - 2 Score 6 0 0  Altered sleeping 1 - -  Tired, decreased energy 1 - -  Change in appetite 0 - -  Feeling bad or failure about yourself  2 - -  Trouble concentrating 1 - -  Moving slowly or fidgety/restless 0 - -  Suicidal thoughts 1 - -  PHQ-9 Score 12 - -   Assessment and plan: 1.  Depressed mood  patient is experiencing a depressed mood, however is not thought to be in danger to himself or others at today's visit.  He has good protective factors including his children and grandson.  He is not interested in making changes to his medicines at today's visit, however he does feel that he could benefit from speaking with someone regarding his recent life stressors.  - Behavioral health warm handoff - Patient to follow-up with  Dr. Pascal Lux tomorrow morning, he is given a longer visit due to a significant stutter which slows down the interview -Continue Zoloft 100 mg daily -Follow-up in 2 to 4 weeks -Suicide prevention handout given, discussed emergency planning if he does become dangerously depressed  2. Skin Lesion -skin lesion may be actinic keratosis.  Patient does have a history of skin cancer in the past.  Will refer to dermatology.  Orders Placed This Encounter  Procedures  . Flu vaccine HIGH DOSE PF  . Ambulatory referral to Dermatology    Referral Priority:   Routine    Referral Type:   Consultation    Referral Reason:   Specialty Services Required    Requested  Specialty:   Dermatology    Number of Visits Requested:   1    [name], MD,MS,  PGY3 04/01/2018 2:45 PM

## 2018-04-01 NOTE — BH Specialist Note (Signed)
Integrated Behavioral Health Warm Handoff  MRN: [id number] Name: [name] [name]  Total time: 15 minutes  Type of Service: Integrated Behavioral Health Interpretor:No. Interpretor Name and Language: NA   Warm Hand Off Completed.      Patient  verbally consented to meet with [facility] Consultant about presenting concerns. SUBJECTIVE: [name] [name] is a 72 y.o. male referred by Dr. Mosetta Putt for assistance with managing symptoms of depression. Per PCP positive # 9 has no plan to harm self, thoughts are better not being here. Protective factors children and grandchildren. Depression screen [facility] 2/9 04/01/2018 10/23/2017 07/03/2017  Decreased Interest 3 0 0  Down, Depressed, Hopeless 3 0 0  PHQ - 2 Score 6 0 0  Altered sleeping 1 - -  Tired, decreased energy 1 - -  Change in appetite 0 - -  Feeling bad or failure about yourself  2 - -  Trouble concentrating 1 - -  Moving slowly or fidgety/restless 0 - -  Suicidal thoughts 1 - -  PHQ-9 Score 12 - -    Therapy about 2 years ago in [place] to help with caregiver stress when caring for his partner. Went for about 1 year and found it helpful.  Partner passed away about a year ago.  Family and Social: lives alone, has children and grandchildren.  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 ASSESSMENT: Patient is currently experiencing symptoms of  Depression which has been managed with zolft. Patient taking 100mg  of zoloft at this time. He may benefit from and is in agreement to receive further assessment and brief therapeutic interventions to assist with managing his symptoms.  PLAN:Referral(s): Integrated Behavioral Health Services (In Clinic) appointment  scheduled 04/02/18 with Dr. Gwendolyn Lima, LCSW Behavioral Health Clinician [facility]   [phone] 4:48 PM

## 2018-04-01 NOTE — Patient Instructions (Signed)
It was a pleasure seeing you today in our clinic.   Please schedule a visit to follow-up in the next 2 weeks we can check in your depression.  A consult was placed to dermatology at today's visit.  You will receive a call to schedule an appointment. If you do not receive a call within two weeks please call our office so we can place the consult again.  Our clinic's number is [phone]. Please call with questions or concerns about what we discussed today.  Be well, Dr. Mosetta Putt  When you're going through tough times, it's easy to feel lonely and overwhelmed. Remember that YOU ARE NOT ALONE and we at [facility] want to support you during this difficult time.  There are many ways to get help and support when you are ready.  You can call the following help lines: - National suicide hotline (727-838-3807) - National Hopeline Network (1-800-SUICIDE)   You can schedule an appointment with a team member at [facility] - your primary care doc or one of our counselors.  We are all here to support you. A doctor is on call 24/7, so call us if you need to [phone]).   There are many treatments that can help people during difficult times, and we can talk with you about medications, counseling, diet, and other options. We want to offer you HOPE that it won't always feel this bad.   If you are seriously thinking about hurting yourself or have a plan, please call 911 or go to any emergency room right away for immediate help.

## 2018-04-02 ENCOUNTER — Ambulatory Visit (INDEPENDENT_AMBULATORY_CARE_PROVIDER_SITE_OTHER): Payer: Medicare Other | Admitting: Psychology

## 2018-04-02 DIAGNOSIS — Z630 Problems in relationship with spouse or partner: Secondary | ICD-10-CM

## 2018-04-02 NOTE — Progress Notes (Signed)
Integrated Behavioral Health Follow Up Visit  MRN: [id number] Name: [name] [name]  Number of Integrated Behavioral Health Clinician visits: 1/6 Session Start time: 9:30  Session End time: 10:20 Total time: 50 minutes  SUBJECTIVE: [name] [name] is a 72 y.o. male. Patient was seen by Gavin Pound yesterday as a warm handoff. Dr. Mosetta Putt saw him yesterday.  Patient reports the following symptoms/concerns: History of depression with a very specific issue he would like to discuss around a relationship. Duration of problem: 1-2 months; Severity of problem: mild  OBJECTIVE: Mood: Euthymic and Affect: Appropriate Risk of harm to self or others: No evidence of suicidal ideation. #9 on PHQ-9 is a 2 but ideation is passive in nature.  GOALS ADDRESSED: Patient will: 1. Reduce symptoms of anxiety and depression by having a conversation with a significant other in order to get better clarity 2. Patient agreed to tune into his emotional experience and use it as a guide for next best steps 3. Patient will continue to work on adjustment to retirement by engaging in his community in meaningful ways.   INTERVENTIONS: Interventions utilized:  Motivational Interviewing, CBT, Solution-focused therapy. Standardized Assessments completed: PHQ-9 of 13; GAD-7 of 9

## 2018-04-02 NOTE — Assessment & Plan Note (Signed)
Patient had a very focused reason for the visit. I gathered history and from that, he was able to make a plan. This relationship issue is complicated by underlying depression that is long-standing and also the anniversary reaction of the loss of his partner, November, 2017.  Patient has significant strengths in introspection and self-awareness and had a good sense of things coming in. He reported that he felt much better after our visit and had a clear path forward. I offered him a return appointment. He said he would call as needed.

## 2018-04-07 ENCOUNTER — Telehealth: Payer: Self-pay | Admitting: Cardiology

## 2018-04-07 ENCOUNTER — Ambulatory Visit (INDEPENDENT_AMBULATORY_CARE_PROVIDER_SITE_OTHER): Payer: Medicare Other | Admitting: *Deleted

## 2018-04-07 DIAGNOSIS — I442 Atrioventricular block, complete: Secondary | ICD-10-CM | POA: Diagnosis not present

## 2018-04-07 NOTE — Telephone Encounter (Signed)
LMOVM reminding pt to send remote transmission.   

## 2018-04-08 NOTE — Progress Notes (Signed)
Remote pacemaker transmission.   

## 2018-04-15 ENCOUNTER — Encounter: Payer: Self-pay | Admitting: Student in an Organized Health Care Education/Training Program

## 2018-04-15 ENCOUNTER — Other Ambulatory Visit: Payer: Self-pay

## 2018-04-15 ENCOUNTER — Ambulatory Visit (INDEPENDENT_AMBULATORY_CARE_PROVIDER_SITE_OTHER): Payer: Medicare Other | Admitting: Student in an Organized Health Care Education/Training Program

## 2018-04-15 VITALS — BP 130/70 | HR 81 | Temp 98.3°F | Wt 206.8 lb

## 2018-04-15 DIAGNOSIS — R29898 Other symptoms and signs involving the musculoskeletal system: Secondary | ICD-10-CM

## 2018-04-15 NOTE — Progress Notes (Signed)
Subjective:    [name] [name] - 72 y.o. male MRN [id number]  Date of birth: [birth year]  HPI  [name] [name] is here for right leg discomfort.  Right leg pain -patient endorses pain lateral to the right knee that radiates up his lateral and posterior thigh to the buttock.  Pain does not go down his leg to the foot.  He has not had any numbness, tingling, or weakness.  Pain is worse when he is driving for more than 45 minutes at a time, and improves when he gets out of the car.  He is able to sit in the car as a passenger for that long, however has discomfort when he is the driver.  Pain is been going on for the last 6 months.  He has not had any change in activity, trauma, or surgery.  He first developed this pain after undergoing a colonoscopy and wonders whether the pain is due to positioning during the colonoscopy.  He has not had any lower back pain or urinary/fecal incontinence.  No fevers or night sweats.  He is able to lie on the right side at night without discomfort.   Recent depressed mood episode she was recently seen in the office with depressed mood and was referred to behavioral health.  He followed up with Dr. Pascal Lux.  He feels that his visit with integrative care was very helpful and his mood has been improved.  PHQ 9 is 7 in the office today compared with 12 previously.  He does endorse passive thoughts that he may be better off dead, however has no specific plan, does not actively want to kill himself, and cites reasons for living including his grandchildren.    Depression screen [facility] 2/9 04/15/2018 04/15/2018 04/01/2018 10/23/2017 07/03/2017  Decreased Interest 1 0 3 0 0  Down, Depressed, Hopeless 1 1 3  0 0  PHQ - 2 Score 2 1 6  0 0  Altered sleeping 1 - 1 - -  Tired, decreased energy 1 - 1 - -  Change in appetite 0 - 0 - -  Feeling bad or failure about yourself  2 - 2 - -  Trouble concentrating 0 - 1 - -  Moving slowly or fidgety/restless 0 - 0 - -  Suicidal thoughts 1 - 1 - -    PHQ-9 Score 7 - 12 - -  Difficult doing work/chores Not difficult at all - - - -   Health Maintenance:   Health Maintenance Due  Topic Date Due  . Hepatitis C Screening  [birth year]  . TETANUS/TDAP  09/07/1965  . PNA vac Low Risk Adult (1 of 2 - PCV13) 09/08/2011    -  reports that he has never smoked. He has never used smokeless tobacco. - Review of Systems: Per HPI. - Past Medical History: Patient Active Problem List   Diagnosis Date Noted  . Relationship problem between partners 04/02/2018  . 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 Hypothyroidism 04/29/2017   - Medications: reviewed and updated Current Outpatient Medications  Medication Sig Dispense Refill  . cetirizine (ZYRTEC) 10 MG tablet Take 1 tablet (10 mg total) daily by mouth. 30 tablet 11  . levothyroxine (SYNTHROID, LEVOTHROID) 100 MCG tablet TAKE 1 TABLET BY MOUTH ONCE DAILY 90 tablet 0  . pravastatin (PRAVACHOL) 40 MG tablet TAKE 1 TABLET BY MOUTH EVERY DAY 90 tablet 3  . ranitidine (ZANTAC) 150 MG tablet Take 1 tablet (150 mg total) by mouth 2 (two) times daily. 60  tablet 1  . sertraline (ZOLOFT) 100 MG tablet Take 1.5 tablets (150 mg total) by mouth daily. 135 tablet 0   No current facility-administered medications for this visit.     Review of Systems See HPI     Objective:   Physical Exam BP 130/70   Pulse 81   Temp 98.3 F (36.8 C) (Oral)   Wt 206 lb 12.8 oz (93.8 kg)   SpO2 95%   BMI 25.85 kg/m  Gen: NAD, alert, cooperative with exam, well-appearing HEENT: NCAT, PERRL, clear conjunctiva, oropharynx clear, supple neck CV: RRR, good S1/S2, no murmur, no edema, capillary refill brisk  Resp: CTABL, no wheezes, non-labored Abd: SNTND, BS present, no guarding or organomegaly Skin: no rashes, normal turgor  Neuro: no gross deficits.  Psych: good insight, alert and oriented Hip, right: No tenderness to palpation.  No rash, erythema, or edema.  Range of motion full in all directions.  Strength 5 out  of 5 in internal and external rotation, flexion, extension, adduction.  4.5 strength in abduction bilaterally. Negative straight leg raise.  Greater trochanter without tenderness palpation.  No tenderness over the piriformis. Knee: Normal to inspection without effusion or obvious bony deformity.  Palpation without warmth, joint line tenderness, patellar tenderness or crepitus.  No condyle tenderness.  Range of motion full in flexion and extension.  Ligaments of solid and consistent endpoints including ACL, PCL, LCL, MCL. Back Exam: Normal to inspection.  No palpable tenderness.  Range of motion full in flexion, extension, sidebending and rotation.  Leg strength 5 out of 5 throughout.  Gait is unremarkable.  Negative straight leg test bilaterally.   Assessment & Plan:   1. Right leg pain -symptoms and exam are most consistent with gluteus medius weakness.  Patient likely has some arthritis at 72 years old, however he has full range of motion on his hip and knee exam without crepitus and so any arthritis is likely mild.  Symptoms do not seem to be stemming from his lower back.  He does have weakness on abduction of the leg at the hip. -Gluteus medius exercises including lateral leg raises, crossover step ups, step ups -NSAIDs as needed for pain -Follow up in 4 to 6 weeks if symptoms persist or fail to improve -Could consider plain films at follow-up if symptoms have not improved   2. Depressed mood, improved -patient does not appear to be in any acute danger to himself or others.  He is not actively suicidal.  His mood remains stable on 100 mg of Zoloft daily.  He does not currently have follow-up with integrative care, however has the number to call if he feels that he needs further appointments. [name], MD,MS,  PGY3 04/15/2018 4:38 PM

## 2018-04-15 NOTE — Patient Instructions (Signed)
It was a pleasure seeing you today in our clinic. Here is the treatment plan we have discussed and agreed upon together:  Please complete the lateral hip exercises we discussed in the office today.       You can use anti-inflammatories as needed for pain. Follow up if symptoms worsen or fail to improve.  Our clinic's number is [phone]. Please call with questions or concerns about what we discussed today.  Be well, Dr. Mosetta Putt

## 2018-06-03 ENCOUNTER — Encounter: Payer: Self-pay | Admitting: Internal Medicine

## 2018-06-03 ENCOUNTER — Ambulatory Visit (INDEPENDENT_AMBULATORY_CARE_PROVIDER_SITE_OTHER): Payer: Medicare Other | Admitting: Internal Medicine

## 2018-06-03 VITALS — BP 122/64 | HR 60 | Ht 75.0 in | Wt 206.0 lb

## 2018-06-03 DIAGNOSIS — I442 Atrioventricular block, complete: Secondary | ICD-10-CM

## 2018-06-03 DIAGNOSIS — Z950 Presence of cardiac pacemaker: Secondary | ICD-10-CM | POA: Diagnosis not present

## 2018-06-03 NOTE — Patient Instructions (Signed)

## 2018-06-03 NOTE — Progress Notes (Signed)
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 Mosetta PuttFeng. He is a 72 yo man with CHB, s/p PPM insertion, dyslipidemia, thyroid dysfunction, underwent PPM insertion, 9 years ago. He denies chest pain, sob, or peripheral edema. No syncope. Allergies  Allergen Reactions  . Penicillins   . Sulfa Antibiotics      Current Outpatient Medications  Medication Sig Dispense Refill  . levothyroxine (SYNTHROID, LEVOTHROID) 100 MCG tablet TAKE 1 TABLET BY MOUTH ONCE DAILY 90 tablet 0  . pravastatin (PRAVACHOL) 40 MG tablet TAKE 1 TABLET BY MOUTH EVERY DAY 90 tablet 3  . sertraline (ZOLOFT) 100 MG tablet Take 1.5 tablets (150 mg total) by mouth daily. 135 tablet 0   No current facility-administered medications for this visit.      Past Medical History:  Diagnosis Date  . 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 History of skin cancer 04/29/2017  . History of skin cancer 04/29/2017  . Hypothyroidism 04/29/2017  . Itching 04/29/2017  . Scabies 05/28/2017    ROS:   All systems reviewed and negative except as noted in the HPI.   No past surgical history on file.   Family History  Problem Relation Age of Onset  . Hypertension Brother      Social History   Socioeconomic History  . Marital status: Divorced    Spouse name: Not on file  . Number of children: Not on file  . Years of education: Not on file  . Highest education level: Not on file  Occupational History  . Not on file  Social Needs  . Financial resource strain: Not on file  . Food insecurity:    Worry: Not on file    Inability: Not on file  . Transportation needs:    Medical: Not on file    Non-medical: Not on file  Tobacco Use  . Smoking status: Never Smoker  . Smokeless tobacco: Never Used  Substance and Sexual Activity  . Alcohol use: Yes  . Drug use: No  . Sexual activity: Not on file  Lifestyle  . Physical activity:    Days per week: Not on file      Minutes per session: Not on file  . Stress: Not on file  Relationships  . Social connections:    Talks on phone: Not on file    Gets together: Not on file    Attends religious service: Not on file    Active member of club or organization: Not on file    Attends meetings of clubs or organizations: Not on file    Relationship status: Not on file  . Intimate partner violence:    Fear of current or ex partner: Not on file    Emotionally abused: Not on file    Physically abused: Not on file    Forced sexual activity: Not on file  Other Topics Concern  . Not on file  Social History Narrative  . Not on file     BP 122/64   Pulse 60   Ht 6\' 3"  (1.905 m)   Wt 206 lb (93.4 kg)   BMI 25.75 kg/m   Physical Exam:  Well appearing NAD HEENT: Unremarkable Neck:  No JVD, no thyromegally Lymphatics:  No adenopathy Back:  No CVA tenderness Lungs:  Clear with no wheezes HEART:  Regular rate rhythm, no murmurs, no rubs, no clicks Abd:  soft, positive bowel sounds, no organomegally, no rebound, no guarding  Ext:  2 plus pulses, no edema, no cyanosis, no clubbing Skin:  No rashes no nodules Neuro:  CN II through XII intact, motor grossly intact  EKG - nsr with p synchronous ventricular pacing  DEVICE  Normal device function.  See PaceArt for details.   Assess/Plan: 1. CHB - he has no escape today. He is asymptomatic, s/p PPM insertion. 2. PPM - his [employer] Jude DDD PM is working normally. He is 1-2 years away from gen change. 3. HTN - his blood pressure is working normally.  [name] [name].D

## 2018-06-12 LAB — CUP PACEART INCLINIC DEVICE CHECK
Implantable Lead Implant Date: 20101229
Implantable Lead Implant Date: 20101229
Implantable Lead Location: 753859
Implantable Lead Location: 753860
Implantable Pulse Generator Implant Date: 20101229
MDC IDC SESS DTM: 20191220144156
Pulse Gen Serial Number: 7083047

## 2018-06-13 LAB — CUP PACEART REMOTE DEVICE CHECK
Battery Remaining Percentage: 17 %
Battery Voltage: 2.75 V
Brady Statistic AP VP Percent: 31 %
Brady Statistic RA Percent Paced: 31 %
Brady Statistic RV Percent Paced: 99 %
Implantable Lead Implant Date: 20101229
Implantable Lead Location: 753860
Implantable Lead Model: 1948
Implantable Pulse Generator Implant Date: 20101229
Lead Channel Impedance Value: 550 Ohm
Lead Channel Pacing Threshold Amplitude: 0.625 V
Lead Channel Pacing Threshold Pulse Width: 0.4 ms
Lead Channel Pacing Threshold Pulse Width: 0.4 ms
Lead Channel Sensing Intrinsic Amplitude: 2.5 mV
Lead Channel Setting Pacing Amplitude: 0.875
Lead Channel Setting Pacing Amplitude: 1.625
Lead Channel Setting Sensing Sensitivity: 0.5 mV
MDC IDC LEAD IMPLANT DT: 20101229
MDC IDC LEAD LOCATION: 753859
MDC IDC MSMT BATTERY REMAINING LONGEVITY: 22 mo
MDC IDC MSMT LEADCHNL RA IMPEDANCE VALUE: 390 Ohm
MDC IDC MSMT LEADCHNL RV PACING THRESHOLD AMPLITUDE: 0.625 V
MDC IDC MSMT LEADCHNL RV SENSING INTR AMPL: 12 mV
MDC IDC SESS DTM: 20191016040642
MDC IDC SET LEADCHNL RV PACING PULSEWIDTH: 0.4 ms
MDC IDC STAT BRADY AP VS PERCENT: 1 %
MDC IDC STAT BRADY AS VP PERCENT: 69 %
MDC IDC STAT BRADY AS VS PERCENT: 1 %
Pulse Gen Model: 2110
Pulse Gen Serial Number: 7083047

## 2018-07-07 ENCOUNTER — Ambulatory Visit (INDEPENDENT_AMBULATORY_CARE_PROVIDER_SITE_OTHER): Payer: Medicare Other

## 2018-07-07 DIAGNOSIS — I442 Atrioventricular block, complete: Secondary | ICD-10-CM | POA: Diagnosis not present

## 2018-07-08 NOTE — Progress Notes (Signed)
Remote pacemaker transmission.   

## 2018-07-11 LAB — CUP PACEART REMOTE DEVICE CHECK
Battery Remaining Longevity: 17 mo
Battery Remaining Percentage: 12 %
Battery Voltage: 2.72 V
Brady Statistic AP VP Percent: 33 %
Brady Statistic AP VS Percent: 1 %
Brady Statistic AS VP Percent: 67 %
Brady Statistic AS VS Percent: 1 %
Brady Statistic RA Percent Paced: 33 %
Brady Statistic RV Percent Paced: 99 %
Date Time Interrogation Session: 20200114074326
Implantable Lead Implant Date: 20101229
Implantable Lead Implant Date: 20101229
Implantable Lead Location: 753859
Implantable Lead Location: 753860
Implantable Lead Model: 1948
Lead Channel Impedance Value: 400 Ohm
Lead Channel Impedance Value: 550 Ohm
Lead Channel Pacing Threshold Amplitude: 0.625 V
Lead Channel Pacing Threshold Amplitude: 0.75 V
Lead Channel Pacing Threshold Pulse Width: 0.4 ms
Lead Channel Pacing Threshold Pulse Width: 0.4 ms
Lead Channel Sensing Intrinsic Amplitude: 12 mV
Lead Channel Sensing Intrinsic Amplitude: 2.3 mV
Lead Channel Setting Pacing Amplitude: 0.875
Lead Channel Setting Pacing Amplitude: 1.75 V
Lead Channel Setting Sensing Sensitivity: 0.5 mV
MDC IDC PG IMPLANT DT: 20101229
MDC IDC SET LEADCHNL RV PACING PULSEWIDTH: 0.4 ms
Pulse Gen Model: 2110
Pulse Gen Serial Number: 7083047

## 2018-07-22 ENCOUNTER — Other Ambulatory Visit: Payer: Self-pay | Admitting: Student in an Organized Health Care Education/Training Program

## 2018-10-06 ENCOUNTER — Ambulatory Visit (INDEPENDENT_AMBULATORY_CARE_PROVIDER_SITE_OTHER): Payer: Medicare Other | Admitting: *Deleted

## 2018-10-06 ENCOUNTER — Other Ambulatory Visit: Payer: Self-pay

## 2018-10-06 DIAGNOSIS — I442 Atrioventricular block, complete: Secondary | ICD-10-CM

## 2018-10-06 LAB — CUP PACEART REMOTE DEVICE CHECK
Battery Remaining Longevity: 9 mo
Battery Remaining Percentage: 6 %
Battery Voltage: 2.68 V
Brady Statistic AP VP Percent: 36 %
Brady Statistic AP VS Percent: 1 %
Brady Statistic AS VP Percent: 64 %
Brady Statistic AS VS Percent: 1 %
Brady Statistic RA Percent Paced: 36 %
Brady Statistic RV Percent Paced: 99 %
Date Time Interrogation Session: 20200414060009
Implantable Lead Implant Date: 20101229
Implantable Lead Implant Date: 20101229
Implantable Lead Location: 753859
Implantable Lead Location: 753860
Implantable Lead Model: 1948
Implantable Pulse Generator Implant Date: 20101229
Lead Channel Impedance Value: 400 Ohm
Lead Channel Impedance Value: 540 Ohm
Lead Channel Pacing Threshold Amplitude: 0.625 V
Lead Channel Pacing Threshold Amplitude: 0.625 V
Lead Channel Pacing Threshold Pulse Width: 0.4 ms
Lead Channel Pacing Threshold Pulse Width: 0.4 ms
Lead Channel Sensing Intrinsic Amplitude: 10.8 mV
Lead Channel Sensing Intrinsic Amplitude: 2.9 mV
Lead Channel Setting Pacing Amplitude: 0.875
Lead Channel Setting Pacing Amplitude: 1.625
Lead Channel Setting Pacing Pulse Width: 0.4 ms
Lead Channel Setting Sensing Sensitivity: 0.5 mV
Pulse Gen Model: 2110
Pulse Gen Serial Number: 7083047

## 2018-10-13 ENCOUNTER — Encounter: Payer: Self-pay | Admitting: Cardiology

## 2018-10-13 NOTE — Progress Notes (Signed)
Remote pacemaker transmission.   

## 2018-12-05 ENCOUNTER — Encounter (HOSPITAL_COMMUNITY): Payer: Self-pay | Admitting: Emergency Medicine

## 2018-12-05 ENCOUNTER — Emergency Department (HOSPITAL_COMMUNITY): Payer: Medicare Other

## 2018-12-05 ENCOUNTER — Emergency Department (HOSPITAL_COMMUNITY)
Admission: EM | Admit: 2018-12-05 | Discharge: 2018-12-05 | Disposition: A | Discharge status: Home / Self Care | Payer: Medicare Other | Attending: Emergency Medicine | Admitting: Emergency Medicine

## 2018-12-05 ENCOUNTER — Other Ambulatory Visit: Payer: Self-pay

## 2018-12-05 DIAGNOSIS — S0990XA Unspecified injury of head, initial encounter: Secondary | ICD-10-CM | POA: Diagnosis present

## 2018-12-05 DIAGNOSIS — Z79899 Other long term (current) drug therapy: Secondary | ICD-10-CM | POA: Insufficient documentation

## 2018-12-05 DIAGNOSIS — Y92480 Sidewalk as the place of occurrence of the external cause: Secondary | ICD-10-CM | POA: Insufficient documentation

## 2018-12-05 DIAGNOSIS — S80211A Abrasion, right knee, initial encounter: Secondary | ICD-10-CM | POA: Diagnosis not present

## 2018-12-05 DIAGNOSIS — W01198A Fall on same level from slipping, tripping and stumbling with subsequent striking against other object, initial encounter: Secondary | ICD-10-CM | POA: Diagnosis not present

## 2018-12-05 DIAGNOSIS — T07XXXA Unspecified multiple injuries, initial encounter: Secondary | ICD-10-CM

## 2018-12-05 DIAGNOSIS — Z23 Encounter for immunization: Secondary | ICD-10-CM | POA: Diagnosis not present

## 2018-12-05 DIAGNOSIS — E039 Hypothyroidism, unspecified: Secondary | ICD-10-CM | POA: Diagnosis not present

## 2018-12-05 DIAGNOSIS — S0101XA Laceration without foreign body of scalp, initial encounter: Secondary | ICD-10-CM | POA: Diagnosis not present

## 2018-12-05 DIAGNOSIS — Y998 Other external cause status: Secondary | ICD-10-CM | POA: Insufficient documentation

## 2018-12-05 DIAGNOSIS — Y9301 Activity, walking, marching and hiking: Secondary | ICD-10-CM | POA: Diagnosis not present

## 2018-12-05 DIAGNOSIS — Z950 Presence of cardiac pacemaker: Secondary | ICD-10-CM | POA: Insufficient documentation

## 2018-12-05 DIAGNOSIS — W19XXXA Unspecified fall, initial encounter: Secondary | ICD-10-CM

## 2018-12-05 MED ORDER — TETANUS-DIPHTH-ACELL PERTUSSIS 5-2.5-18.5 LF-MCG/0.5 IM SUSP
0.5000 mL | Freq: Once | INTRAMUSCULAR | Status: AC
  Administered 2018-12-05: 0.5 mL via INTRAMUSCULAR
  Filled 2018-12-05: qty 0.5

## 2018-12-05 MED ORDER — LIDOCAINE-EPINEPHRINE 2 %-1:100000 IJ SOLN
20.0000 mL | Freq: Once | INTRAMUSCULAR | Status: DC
  Filled 2018-12-05: qty 1

## 2018-12-05 NOTE — ED Notes (Signed)
PA at bedside with suture kit.

## 2018-12-05 NOTE — ED Notes (Signed)
PT IS IN X-RAY WILL AMBULATE WHEN PT RETURN TO ROOM

## 2018-12-05 NOTE — ED Triage Notes (Signed)
Patient arrived by EMS from Urgent Care. Pt tripped over side walk this morning while walking. Pt has laceration on forehead per EMS. Pt c/o knee pain. Urgent care was concerned about AMS per EMS.   EMS VS BP 120/82, HR 66, RR 18, BS 127.   Pt has a severe speak impediment.

## 2018-12-05 NOTE — ED Notes (Signed)
Pt d/c home per MD order. Discharge summary reviewed, pt verbalizes understanding. Pt off unit via [facility], reports daughter is on the way to pick him up from the ED. Pt voicing no complaints at discharge.

## 2018-12-05 NOTE — ED Notes (Signed)
ED Provider at bedside. 

## 2018-12-05 NOTE — Discharge Instructions (Addendum)
Please follow-up for suture removal at either urgent care ,the emergency department, or your primary care doctor in 5 days. ° °Please return to the emergency room immediately if you experience any new or worsening symptoms or any symptoms that indicate worsening infection such as fevers, increased redness/swelling/pain, warmth, or drainage from the affected area.  ° °

## 2018-12-05 NOTE — ED Provider Notes (Addendum)
[facility]   CSN: [id number] Arrival date & time: 12/05/18  [id number]    History   Chief Complaint Chief Complaint  Patient presents with   Fall    HPI [name] KillGeorge P [name] is a 73 y.o. male.     HPI   Patient is a 73 year old male with a history of skin cancer, hypothyroidism, complete heart block s/p pacemaker in situ, who presents to the emergency department today for evaluation after a fall.  Patient states he was walking in the park this morning to get some exercise when he tripped on the sidewalk falling forward and hitting his head.  Denies loss of consciousness.  Denies blood thinner use.  Does have a laceration to the forehead.  Denies any neck pain.  Has an abrasion to the knee and has some mild pain to the right knee.  Denies any preceding chest pain, shortness of breath, palpitations, dizziness or lightheadedness before the fall.  Denies feeling any confusion prior to the fall.  States he was seen at urgent care after the fall and apparently urgent care he could not remember his daughter's phone number or his own phone number.  He states he could not remember his daughter's phone number because it is in his phone and he normally does not dilate her note by heart.  He states she could not remember 1 digit of his phone number which is abnormal for him.  He does not feel confused now.  He does not know when his last Tdap was.  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 No pertinent surgical history.    Home Medications    Prior to Admission medications     Medication Sig Start Date End Date Taking? Authorizing Provider  cetirizine (ZYRTEC) 10 MG tablet Take 10 mg by mouth daily.   Yes [provider]  levothyroxine (SYNTHROID, LEVOTHROID) 100 MCG tablet TAKE 1 TABLET BY MOUTH ONCE DAILY 02/03/18  Yes Howard PouchFeng, Lauren, MD  pravastatin (PRAVACHOL) 40 MG tablet TAKE 1 TABLET BY MOUTH EVERY DAY 03/30/18  Yes Howard PouchFeng, Lauren, MD  sertraline (ZOLOFT) 100 MG tablet Take 1.5 tablets (150 mg total) by mouth daily. 02/10/18  Yes Howard PouchFeng, Lauren, MD    Family History Family History  Problem Relation Age of Onset   Hypertension Brother     Social History Social History   Tobacco Use   Smoking status: Never Smoker   Smokeless tobacco: Never Used  Substance Use Topics   Alcohol use: Yes   Drug use: No     Allergies   Penicillins and Sulfa antibiotics   Review of Systems Review of Systems  Constitutional: Negative for fever.  HENT: Negative for nosebleeds and sore throat.   Eyes: Negative for visual disturbance.  Respiratory: Negative for cough and shortness of breath.   Cardiovascular: Negative for chest pain.  Gastrointestinal: Negative for abdominal pain, nausea and vomiting.  Genitourinary: Negative for dysuria.  Musculoskeletal: Negative for back pain and neck pain.       Right knee pain  Skin: Positive for wound.  Neurological: Negative for dizziness,  syncope, weakness, light-headedness, numbness and headaches.       Head trauma, no LOC. Confusion (resolved).   All other systems reviewed and are negative.    Physical Exam Updated Vital Signs BP (!) 157/84    Pulse 62    Temp 98.4 F (36.9 C) (Oral)    Resp 18    Ht 6' (1.829 m)    Wt 90.7 kg    SpO2 97%    BMI 27.12 kg/m   Physical Exam Vitals signs and nursing note reviewed.  Constitutional:      General: He is not in acute distress.    Appearance: He is well-developed. He is not ill-appearing or toxic-appearing.  HENT:     Head: Normocephalic.     Comments: 2.5cm  superficial diagonal laceration to the mid forehead. No active bleeding.     Nose:     Comments: Superficial abrasion to the tip of the nose, no TTP of the nasal bridge    Mouth/Throat:     Mouth: Mucous membranes are dry.  Eyes:     Conjunctiva/sclera: Conjunctivae normal.  Neck:     Musculoskeletal: Neck supple.  Cardiovascular:     Rate and Rhythm: Normal rate and regular rhythm.     Pulses: Normal pulses.     Heart sounds: Normal heart sounds. No murmur.  Pulmonary:     Effort: Pulmonary effort is normal. No respiratory distress.     Breath sounds: Normal breath sounds. No wheezing, rhonchi or rales.  Chest:     Chest wall: No tenderness.  Abdominal:     General: Bowel sounds are normal.     Palpations: Abdomen is soft.     Tenderness: There is no abdominal tenderness. There is no guarding or rebound.  Musculoskeletal:     Comments: No TTP to the CTL spine. No TTP to the bilat hips. Pelvis stable to compression. Superficial abrasion to the right superior patella with mild overlying TTP. No swelling.   Skin:    General: Skin is warm and dry.  Neurological:     Mental Status: He is alert.     Comments: Mental Status:  Alert, thought content appropriate, able to give a coherent history. Pt has speech impediment (chronic). Able to follow 2 step commands without difficulty. Oriented x4.  Cranial Nerves:  II:  pupils equal, round, reactive to light III,IV, VI: ptosis not present, extra-ocular motions intact bilaterally  V,VII: smile symmetric, facial light touch sensation equal VIII: hearing grossly normal to voice  X: uvula elevates symmetrically  XI: bilateral shoulder shrug symmetric and strong XII: midline tongue extension without fassiculations Motor:  Normal tone. 5/5 strength of BUE and BLE major muscle groups including strong and equal grip strength and dorsiflexion/plantar flexion Sensory: light touch normal in all extremities. Gait: normal gait and balance. CV: 2+  radial and DP pulses       ED Treatments / Results  Labs (all labs ordered are listed, but only abnormal results are displayed) Labs Reviewed - No data to display  EKG None  Radiology Ct Head Wo Contrast  Result Date: 12/05/2018 CLINICAL DATA:  Fall. Tripped over sidewalk with laceration to forehead. Headache. EXAM: CT HEAD WITHOUT CONTRAST CT CERVICAL SPINE WITHOUT CONTRAST TECHNIQUE: Multidetector CT imaging of the head and cervical spine was performed following the standard protocol without intravenous contrast. Multiplanar CT image reconstructions of the cervical spine were also generated. COMPARISON:  None. FINDINGS: CT HEAD FINDINGS Brain: There is no evidence of acute  infarct, intracranial hemorrhage, mass, midline shift, or extra-axial fluid collection. Mild cerebral atrophy is not greater than expected for age. Vascular: Mild calcific atherosclerosis involving the carotid siphons. No hyperdense vessel. Skull: No fracture or focal osseous lesion. Sinuses/Orbits: Visualized paranasal sinuses are clear. There are trace mastoid effusions. Orbits are unremarkable. Other: Small forehead hematoma/laceration. CT CERVICAL SPINE FINDINGS Alignment: Cervical spine straightening with trace anterolisthesis of C4 on C5 and C5 on C6, likely degenerative. Skull base and vertebrae: No acute fracture or suspicious osseous lesion. Soft tissues and spinal canal: No prevertebral fluid or swelling. No visible canal hematoma. Disc levels: Mild disc degeneration in the mid to lower cervical spine. Moderate right neural foraminal stenosis at C4-5 due to uncovertebral and facet spurring. Moderate right neural foraminal stenosis at C6-7 due to uncovertebral spurring. Upper chest: No apical lung consolidation or mass. Other: None. IMPRESSION: 1. No evidence of acute intracranial abnormality. 2. Small forehead hematoma/laceration. 3. No acute cervical spine fracture. Electronically Signed   By: Sebastian AcheAllen  Grady M.D.   On:  12/05/2018 11:26   Ct Cervical Spine Wo Contrast  Result Date: 12/05/2018 CLINICAL DATA:  Fall. Tripped over sidewalk with laceration to forehead. Headache. EXAM: CT HEAD WITHOUT CONTRAST CT CERVICAL SPINE WITHOUT CONTRAST TECHNIQUE: Multidetector CT imaging of the head and cervical spine was performed following the standard protocol without intravenous contrast. Multiplanar CT image reconstructions of the cervical spine were also generated. COMPARISON:  None. FINDINGS: CT HEAD FINDINGS Brain: There is no evidence of acute infarct, intracranial hemorrhage, mass, midline shift, or extra-axial fluid collection. Mild cerebral atrophy is not greater than expected for age. Vascular: Mild calcific atherosclerosis involving the carotid siphons. No hyperdense vessel. Skull: No fracture or focal osseous lesion. Sinuses/Orbits: Visualized paranasal sinuses are clear. There are trace mastoid effusions. Orbits are unremarkable. Other: Small forehead hematoma/laceration. CT CERVICAL SPINE FINDINGS Alignment: Cervical spine straightening with trace anterolisthesis of C4 on C5 and C5 on C6, likely degenerative. Skull base and vertebrae: No acute fracture or suspicious osseous lesion. Soft tissues and spinal canal: No prevertebral fluid or swelling. No visible canal hematoma. Disc levels: Mild disc degeneration in the mid to lower cervical spine. Moderate right neural foraminal stenosis at C4-5 due to uncovertebral and facet spurring. Moderate right neural foraminal stenosis at C6-7 due to uncovertebral spurring. Upper chest: No apical lung consolidation or mass. Other: None. IMPRESSION: 1. No evidence of acute intracranial abnormality. 2. Small forehead hematoma/laceration. 3. No acute cervical spine fracture. Electronically Signed   By: Sebastian AcheAllen  Grady M.D.   On: 12/05/2018 11:26   Dg Knee Complete 4 Views Right  Result Date: 12/05/2018 CLINICAL DATA:  Pain after trauma. EXAM: RIGHT KNEE - COMPLETE 4+ VIEW COMPARISON:  None.  FINDINGS: There is a lucency through the superior aspect of the patella. Anterior soft tissue swelling is identified. Small joint effusion. No other acute abnormalities. Mild degenerative changes. IMPRESSION: There is a lucency through the superior most aspect of the patella with overlying soft tissue swelling. The patient is also reported to have anterior pain. I suspect an acute fracture through the superior most aspect of the patella best seen on the lateral view. Recommend clinical correlation for point tenderness in this region. Electronically Signed   By: Gerome Samavid  Williams III M.D   On: 12/05/2018 11:39    Procedures .Marland Kitchen.Laceration Repair  Date/Time: 12/05/2018 2:42 PM Performed by: Karrie Meresouture, Ailah Barna S, PA-C Authorized by: Karrie Meresouture, Zoua Caporaso S, PA-C   Consent:    Consent obtained:  Verbal  Consent given by:  Patient   Risks discussed:  Infection, pain and retained foreign body   Alternatives discussed:  No treatment Anesthesia (see MAR for exact dosages):    Anesthesia method:  Local infiltration   Local anesthetic:  Lidocaine 1% WITH epi Laceration details:    Location:  Scalp   Scalp location:  Frontal   Length (cm):  3 Repair type:    Repair type:  Simple Pre-procedure details:    Preparation:  Patient was prepped and draped in usual sterile fashion Exploration:    Hemostasis achieved with:  Epinephrine and direct pressure   Wound exploration: wound explored through full range of motion     Contaminated: no   Treatment:    Area cleansed with:  Betadine and saline   Amount of cleaning:  Standard   Irrigation solution:  Sterile saline   Irrigation method:  Pressure wash   Visualized foreign bodies/material removed: no   Skin repair:    Repair method:  Sutures   Suture size:  5-0   Suture material:  Prolene   Suture technique:  Simple interrupted   Number of sutures:  6 Approximation:    Approximation:  Close Post-procedure details:    Dressing:  Open (no dressing)    Patient tolerance of procedure:  Tolerated well, no immediate complications   (including critical care time)  Medications Ordered in ED Medications  lidocaine-EPINEPHrine (XYLOCAINE W/EPI) 2 %-1:100000 (with pres) injection 20 mL (has no administration in time range)  Tdap (BOOSTRIX) injection 0.5 mL (0.5 mLs Intramuscular Given 12/05/18 1041)     Initial Impression / Assessment and Plan / ED Course  I have reviewed the triage vital signs and the nursing notes.  Pertinent labs & imaging results that were available during my care of the patient were reviewed by me and considered in my medical decision making (see chart for details).   Final Clinical Impressions(s) / ED Diagnoses   Final diagnoses:  Fall, initial encounter  Laceration of scalp, initial encounter  Minor head injury, initial encounter  Multiple abrasions   73 year old male presenting to the ED after mechanical fall while walking in the park.  States he tripped over the sidewalk landing on his knee and his head.  No LOC.  Presented to urgent care prior to arrival and reportedly had some confusion there and cannot remember his daughter's phone number.  Upon arrival to the ED, confusion has resolved.  No preceding chest pain, shortness of breath or other symptoms before the fall.  Neuro exam is nonfocal.  He does have a laceration to his forehead that is not actively bleeding.  His Tdap was updated.  Wound was irrigated and closed with sutures.  Patient had CT imaging of his head and neck which were both negative for acute abnormality.  He also had an x-ray of the right knee which showed a possible lucency over the patella however patient has very minimal tenderness over the knee and the knee is nonpainful with range of motion.  I highly doubt fracture.  Patient in agreement to forego knee immobilization.  We will have him follow-up in 5 days for suture removal and return to the ER for new or worsening symptoms.  He voiced  understanding of the plan and reasons to return.  All questions answered.  Patient stable discharge.  Upon reviewing vital signs, there was an O2 sat of 81% and a heart rate of 130 that was documented, I believe that these were documented in  error as patient has had no other episodes similar to this and has not been complaining of any shortness of breath.  Patient seen in conjunction with Dr. Maryan Rued who personally evaluated this patient is in agreement with plan.  ED Discharge Orders    None       Rodney Booze, PA-C 12/05/18 1301    Rodney Booze, PA-C 12/05/18 1443    Blanchie Dessert, MD 12/06/18 [phone]

## 2019-01-05 ENCOUNTER — Ambulatory Visit (INDEPENDENT_AMBULATORY_CARE_PROVIDER_SITE_OTHER): Payer: Medicare Other | Admitting: *Deleted

## 2019-01-05 DIAGNOSIS — I442 Atrioventricular block, complete: Secondary | ICD-10-CM

## 2019-01-05 LAB — CUP PACEART REMOTE DEVICE CHECK
Date Time Interrogation Session: 20200714103725
Implantable Lead Implant Date: 20101229
Implantable Lead Implant Date: 20101229
Implantable Lead Location: 753859
Implantable Lead Location: 753860
Implantable Lead Model: 1948
Implantable Pulse Generator Implant Date: 20101229
Pulse Gen Model: 2110
Pulse Gen Serial Number: 7083047

## 2019-01-06 ENCOUNTER — Other Ambulatory Visit: Payer: Self-pay | Admitting: Student in an Organized Health Care Education/Training Program

## 2019-01-20 NOTE — Progress Notes (Signed)
Remote pacemaker transmission.   

## 2019-04-07 ENCOUNTER — Ambulatory Visit (INDEPENDENT_AMBULATORY_CARE_PROVIDER_SITE_OTHER): Payer: Medicare Other | Admitting: *Deleted

## 2019-04-07 DIAGNOSIS — I442 Atrioventricular block, complete: Secondary | ICD-10-CM

## 2019-04-07 LAB — CUP PACEART REMOTE DEVICE CHECK
Battery Remaining Longevity: 1 mo
Battery Remaining Percentage: 0.5 %
Battery Voltage: 2.59 V
Brady Statistic AP VP Percent: 35 %
Brady Statistic AP VS Percent: 1 %
Brady Statistic AS VP Percent: 64 %
Brady Statistic AS VS Percent: 1 %
Brady Statistic RA Percent Paced: 35 %
Brady Statistic RV Percent Paced: 99 %
Date Time Interrogation Session: 20201013071934
Implantable Lead Implant Date: 20101229
Implantable Lead Implant Date: 20101229
Implantable Lead Location: 753859
Implantable Lead Location: 753860
Implantable Lead Model: 1948
Implantable Pulse Generator Implant Date: 20101229
Lead Channel Impedance Value: 400 Ohm
Lead Channel Impedance Value: 540 Ohm
Lead Channel Pacing Threshold Amplitude: 0.5 V
Lead Channel Pacing Threshold Amplitude: 0.625 V
Lead Channel Pacing Threshold Pulse Width: 0.4 ms
Lead Channel Pacing Threshold Pulse Width: 0.4 ms
Lead Channel Sensing Intrinsic Amplitude: 10.6 mV
Lead Channel Sensing Intrinsic Amplitude: 2.4 mV
Lead Channel Setting Pacing Amplitude: 0.875
Lead Channel Setting Pacing Amplitude: 1.5 V
Lead Channel Setting Pacing Pulse Width: 0.4 ms
Lead Channel Setting Sensing Sensitivity: 0.5 mV
Pulse Gen Model: 2110
Pulse Gen Serial Number: 7083047

## 2019-04-19 NOTE — Progress Notes (Signed)
Remote pacemaker transmission.   

## 2019-05-10 ENCOUNTER — Ambulatory Visit (INDEPENDENT_AMBULATORY_CARE_PROVIDER_SITE_OTHER): Payer: Medicare Other | Admitting: *Deleted

## 2019-05-10 DIAGNOSIS — I442 Atrioventricular block, complete: Secondary | ICD-10-CM

## 2019-05-13 LAB — CUP PACEART REMOTE DEVICE CHECK
Battery Remaining Longevity: 1 mo
Battery Remaining Percentage: 0.5 %
Battery Voltage: 2.59 V
Brady Statistic AP VP Percent: 35 %
Brady Statistic AP VS Percent: 1 %
Brady Statistic AS VP Percent: 65 %
Brady Statistic AS VS Percent: 1 %
Brady Statistic RA Percent Paced: 34 %
Brady Statistic RV Percent Paced: 99 %
Date Time Interrogation Session: 20201119020015
Implantable Lead Implant Date: 20101229
Implantable Lead Implant Date: 20101229
Implantable Lead Location: 753859
Implantable Lead Location: 753860
Implantable Lead Model: 1948
Implantable Pulse Generator Implant Date: 20101229
Lead Channel Impedance Value: 410 Ohm
Lead Channel Impedance Value: 550 Ohm
Lead Channel Pacing Threshold Amplitude: 0.625 V
Lead Channel Pacing Threshold Amplitude: 0.625 V
Lead Channel Pacing Threshold Pulse Width: 0.4 ms
Lead Channel Pacing Threshold Pulse Width: 0.4 ms
Lead Channel Sensing Intrinsic Amplitude: 2.5 mV
Lead Channel Sensing Intrinsic Amplitude: 7.5 mV
Lead Channel Setting Pacing Amplitude: 0.875
Lead Channel Setting Pacing Amplitude: 1.625
Lead Channel Setting Pacing Pulse Width: 0.4 ms
Lead Channel Setting Sensing Sensitivity: 0.5 mV
Pulse Gen Model: 2110
Pulse Gen Serial Number: 7083047

## 2019-05-27 ENCOUNTER — Telehealth: Payer: Self-pay | Admitting: Internal Medicine

## 2019-05-27 NOTE — Telephone Encounter (Signed)
Merlin alert for pacemaker at [facility].  Attempted to call patient, no answer. Will route to [facility] for scheduling of gen change.  Chanetta Marshall, NP 05/27/2019 11:45 AM

## 2019-06-04 NOTE — Addendum Note (Signed)
Addended by: Douglass Rivers D on: 06/04/2019 12:05 PM   Modules accepted: Level of Service

## 2019-06-04 NOTE — Progress Notes (Signed)
Remote pacemaker transmission.   

## 2019-06-09 NOTE — Progress Notes (Signed)
Cardiology Office Note Date:  06/10/2019  Patient ID:  [name], [name] 1946/06/10, MRN [id number] PCP:  Buzzy Han, MD  Electrophysiologist:  Dr. Lovena Le    Chief Complaint: device at ERI  History of Present Illness: [name] [name] is a 73 y.o. male with history of hypothyroidism, HLD, CHB w/PPM.  He comes in today to be seen for Dr. Lovena Le, last seen by him in Dec 2019. At that time doing well, noting device was nearing ERI in the next year or so.  He is doing quite well. Retired and enjoying time with his family and grandson.  He walks daily for exercise, no exertional incapacities, no CP, palpitations, no SOB or DOE.  No dizzy spells, near syncope or syncope.  His PMD does his labs, manages his cholesterol  Device information: SJM dual chamber PPM implanted 06/21/2009 by Dr. Lum Babe [facility], [place])    Past Medical History:  Diagnosis Date  . 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 History of skin cancer 04/29/2017  . History of skin cancer 04/29/2017  . Hypothyroidism 04/29/2017  . Itching 04/29/2017  . Scabies 05/28/2017    No past surgical history on file.  Current Outpatient Medications  Medication Sig Dispense Refill  . cetirizine (ZYRTEC) 10 MG tablet Take 10 mg by mouth daily.    Marland Kitchen levothyroxine (SYNTHROID, LEVOTHROID) 100 MCG tablet TAKE 1 TABLET BY MOUTH ONCE DAILY 90 tablet 0  . pravastatin (PRAVACHOL) 40 MG tablet TAKE 1 TABLET BY MOUTH EVERY DAY 90 tablet 3  . sertraline (ZOLOFT) 100 MG tablet Take 1.5 tablets (150 mg total) by mouth daily. 135 tablet 0   No current facility-administered medications for this visit.    Allergies:   Penicillins and Sulfa antibiotics   Social History:  The patient  reports that he has never smoked. He has never used smokeless tobacco. He reports current alcohol use. He reports that he does not use drugs.   Family History:  The patient's family history includes Hypertension in his brother.  ROS:  Please  see the history of present illness.  All other systems are reviewed and otherwise negative.   PHYSICAL EXAM:  VS:  BP 108/70   Pulse 62   Ht 6' (1.829 m)   Wt 210 lb (95.3 kg)   BMI 28.48 kg/m  BMI: Body mass index is 28.48 kg/m. Well nourished, well developed, in no acute distress  HEENT: normocephalic, atraumatic  Neck: no JVD, carotid bruits or masses Cardiac:  RRR; no significant murmurs, no rubs, or gallops Lungs:  CTA b/l, no wheezing, rhonchi or rales  Abd: soft, nontender MS: no deformity or atrophy Ext: no edema  Skin: warm and dry, no rash Neuro:  He speaks with a stutter (all his life he tells me), otherwise no gross deficits appreciated Psych: euthymic mood, full affect  PPM site is stable, no tethering or discomfort   EKG:  Done today and reviewed by myself shows SR, V paced  PPM interrogation done today and reviewed by myself:  Battery reached ERI 05/26/2019 Lead measurements are good He is VP at 40bpm today No arrhythmias >99% VP  Recent Labs: No results found for requested labs within last 8760 hours.  No results found for requested labs within last 8760 hours.   CrCl cannot be calculated (No successful lab value found.).   Wt Readings from Last 3 Encounters:  06/10/19 210 lb (95.3 kg)  12/05/18 200 lb (90.7 kg)  06/03/18 206 lb (93.4 kg)  Other studies reviewed: Additional studies/records reviewed today include: summarized above  ASSESSMENT AND PLAN:  1. PPM     reached ERI     He is pacer dependent at 40bpm today  I discussed generator change procedure, potential risks and benefits, he is agreeable to proceed We will schedule at his next convenience, routine follow up afterwards    Disposition: F/u with as above  Current medicines are reviewed at length with the patient today.  The patient did not have any concerns regarding medicines.  [name], PA-C 06/10/2019 11:35 AM     [facility] [address]  [address] [place] [address] [phone] (office)  [phone] (fax)

## 2019-06-10 ENCOUNTER — Ambulatory Visit (INDEPENDENT_AMBULATORY_CARE_PROVIDER_SITE_OTHER): Payer: Medicare Other | Admitting: *Deleted

## 2019-06-10 ENCOUNTER — Other Ambulatory Visit (HOSPITAL_COMMUNITY)
Admission: RE | Admit: 2019-06-10 | Discharge: 2019-06-10 | Disposition: A | Discharge status: Home / Self Care | Payer: Medicare Other | Source: Ambulatory Visit | Attending: Internal Medicine | Admitting: Internal Medicine

## 2019-06-10 ENCOUNTER — Ambulatory Visit (INDEPENDENT_AMBULATORY_CARE_PROVIDER_SITE_OTHER): Payer: Medicare Other | Admitting: Physician Assistant

## 2019-06-10 ENCOUNTER — Encounter: Payer: Self-pay | Admitting: *Deleted

## 2019-06-10 ENCOUNTER — Other Ambulatory Visit: Payer: Self-pay

## 2019-06-10 VITALS — BP 108/70 | HR 62 | Ht 72.0 in | Wt 210.0 lb

## 2019-06-10 DIAGNOSIS — Z950 Presence of cardiac pacemaker: Secondary | ICD-10-CM | POA: Diagnosis not present

## 2019-06-10 DIAGNOSIS — Z45010 Encounter for checking and testing of cardiac pacemaker pulse generator [battery]: Secondary | ICD-10-CM | POA: Diagnosis not present

## 2019-06-10 DIAGNOSIS — Z20828 Contact with and (suspected) exposure to other viral communicable diseases: Secondary | ICD-10-CM | POA: Insufficient documentation

## 2019-06-10 DIAGNOSIS — Z01812 Encounter for preprocedural laboratory examination: Secondary | ICD-10-CM | POA: Diagnosis present

## 2019-06-10 DIAGNOSIS — I442 Atrioventricular block, complete: Secondary | ICD-10-CM | POA: Diagnosis not present

## 2019-06-10 LAB — CUP PACEART REMOTE DEVICE CHECK
Battery Remaining Longevity: 0 mo
Battery Voltage: 2.57 V
Brady Statistic AP VP Percent: 34 %
Brady Statistic AP VS Percent: 1 %
Brady Statistic AS VP Percent: 66 %
Brady Statistic AS VS Percent: 1 %
Brady Statistic RA Percent Paced: 34 %
Brady Statistic RV Percent Paced: 99 %
Date Time Interrogation Session: 20201217023930
Implantable Lead Implant Date: 20101229
Implantable Lead Implant Date: 20101229
Implantable Lead Location: 753859
Implantable Lead Location: 753860
Implantable Lead Model: 1948
Implantable Pulse Generator Implant Date: 20101229
Lead Channel Impedance Value: 400 Ohm
Lead Channel Impedance Value: 510 Ohm
Lead Channel Pacing Threshold Amplitude: 0.625 V
Lead Channel Pacing Threshold Amplitude: 0.625 V
Lead Channel Pacing Threshold Pulse Width: 0.4 ms
Lead Channel Pacing Threshold Pulse Width: 0.4 ms
Lead Channel Sensing Intrinsic Amplitude: 2.3 mV
Lead Channel Sensing Intrinsic Amplitude: 5.5 mV
Lead Channel Setting Pacing Amplitude: 0.875
Lead Channel Setting Pacing Amplitude: 1.625
Lead Channel Setting Pacing Pulse Width: 0.4 ms
Lead Channel Setting Sensing Sensitivity: 0.5 mV
Pulse Gen Model: 2110
Pulse Gen Serial Number: 7083047

## 2019-06-10 NOTE — Patient Instructions (Addendum)
Medication Instructions:   Your physician recommends that you continue on your current medications as directed. Please refer to the Current Medication list given to you today.  *If you need a refill on your cardiac medications before your next appointment, please call your pharmacy*  Lab Work:  BMET  AND CBC TODAY   If you have labs (blood work) drawn today and your tests are completely normal, you will receive your results only by: Marland Kitchen MyChart Message (if you have MyChart) OR . A paper copy in the mail If you have any lab test that is abnormal or we need to change your treatment, we will call you to review the results.  Testing/Procedures:  SEE LETTER  FOR GEN CHANGE ON 06-14-19   Follow-Up: At [facility], you and your health needs are our priority.  As part of our continuing mission to provide you with exceptional heart care, we have created designated Provider Care Teams.  These Care Teams include your primary Cardiologist (physician) and Advanced Practice Providers (APPs -  Physician Assistants and Nurse Practitioners) who all work together to provide you with the care you need, when you need it.  Your next appointment:  AFTER 06-14-19  14 DAY WOUND CHECK WITH DEVICE CLINIC   AND 33 DAY PHYS PACER [place] WITH DR Lovena Le      Other Instructions:

## 2019-06-11 LAB — NOVEL CORONAVIRUS, NAA (HOSP ORDER, SEND-OUT TO REF LAB; TAT 18-24 HRS): SARS-CoV-2, NAA: NOT DETECTED

## 2019-06-11 LAB — BASIC METABOLIC PANEL
BUN/Creatinine Ratio: 19 (ref 10–24)
BUN: 22 mg/dL (ref 8–27)
CO2: 29 mmol/L (ref 20–29)
Calcium: 9.7 mg/dL (ref 8.6–10.2)
Chloride: 103 mmol/L (ref 96–106)
Creatinine, Ser: 1.16 mg/dL (ref 0.76–1.27)
GFR calc Af Amer: 72 mL/min/{1.73_m2} (ref >59)
GFR calc non Af Amer: 63 mL/min/{1.73_m2} (ref >59)
Glucose: 109 mg/dL — ABNORMAL HIGH (ref 65–99)
Potassium: 4.2 mmol/L (ref 3.5–5.2)
Sodium: 144 mmol/L (ref 134–144)

## 2019-06-11 LAB — CBC
Hematocrit: 43.6 % (ref 37.5–51.0)
Hemoglobin: 14.3 g/dL (ref 13.0–17.7)
MCH: 29.7 pg (ref 26.6–33.0)
MCHC: 32.8 g/dL (ref 31.5–35.7)
MCV: 91 fL (ref 79–97)
Platelets: 150 10*3/uL (ref 150–450)
RBC: 4.82 x10E6/uL (ref 4.14–5.80)
RDW: 12.3 % (ref 11.6–15.4)
WBC: 6.8 10*3/uL (ref 3.4–10.8)

## 2019-06-14 ENCOUNTER — Ambulatory Visit (HOSPITAL_COMMUNITY)
Admission: RE | Admit: 2019-06-14 | Discharge: 2019-06-14 | Disposition: A | Discharge status: Home / Self Care | Payer: Medicare Other | Attending: Internal Medicine | Admitting: Internal Medicine

## 2019-06-14 ENCOUNTER — Other Ambulatory Visit: Payer: Self-pay

## 2019-06-14 ENCOUNTER — Ambulatory Visit (HOSPITAL_COMMUNITY)
Admission: RE | Disposition: A | Discharge status: Home / Self Care | Payer: Medicare Other | Source: Home / Self Care | Attending: Internal Medicine

## 2019-06-14 DIAGNOSIS — Z882 Allergy status to sulfonamides status: Secondary | ICD-10-CM | POA: Insufficient documentation

## 2019-06-14 DIAGNOSIS — Z79890 Hormone replacement therapy (postmenopausal): Secondary | ICD-10-CM | POA: Diagnosis not present

## 2019-06-14 DIAGNOSIS — Z79899 Other long term (current) drug therapy: Secondary | ICD-10-CM | POA: Insufficient documentation

## 2019-06-14 DIAGNOSIS — Z45010 Encounter for checking and testing of cardiac pacemaker pulse generator [battery]: Secondary | ICD-10-CM | POA: Diagnosis present

## 2019-06-14 DIAGNOSIS — I442 Atrioventricular block, complete: Secondary | ICD-10-CM

## 2019-06-14 DIAGNOSIS — Z880 Allergy status to penicillin: Secondary | ICD-10-CM | POA: Diagnosis not present

## 2019-06-14 DIAGNOSIS — E039 Hypothyroidism, unspecified: Secondary | ICD-10-CM | POA: Diagnosis not present

## 2019-06-14 DIAGNOSIS — E785 Hyperlipidemia, unspecified: Secondary | ICD-10-CM | POA: Insufficient documentation

## 2019-06-14 HISTORY — PX: PPM GENERATOR CHANGEOUT: EP1233

## 2019-06-14 SURGERY — PPM GENERATOR CHANGEOUT
Anesthesia: LOCAL

## 2019-06-14 MED ORDER — FENTANYL CITRATE (PF) 100 MCG/2ML IJ SOLN
INTRAMUSCULAR | Status: AC
  Filled 2019-06-14: qty 2

## 2019-06-14 MED ORDER — LIDOCAINE HCL (PF) 1 % IJ SOLN
INTRAMUSCULAR | Status: DC | PRN
  Administered 2019-06-14: 60 mL

## 2019-06-14 MED ORDER — MIDAZOLAM HCL 5 MG/5ML IJ SOLN
INTRAMUSCULAR | Status: AC
  Filled 2019-06-14: qty 5

## 2019-06-14 MED ORDER — SODIUM CHLORIDE 0.9 % IV SOLN
80.0000 mg | INTRAVENOUS | Status: AC
  Administered 2019-06-14: 14:00:00 80 mg

## 2019-06-14 MED ORDER — SODIUM CHLORIDE 0.9 % IV SOLN: INTRAVENOUS | Status: DC

## 2019-06-14 MED ORDER — ONDANSETRON HCL 4 MG/2ML IJ SOLN: 4.0000 mg | Freq: Four times a day (QID) | INTRAMUSCULAR | Status: DC | PRN

## 2019-06-14 MED ORDER — VANCOMYCIN HCL IN DEXTROSE 1-5 GM/200ML-% IV SOLN
INTRAVENOUS | Status: AC
  Filled 2019-06-14: qty 200

## 2019-06-14 MED ORDER — LIDOCAINE HCL 1 % IJ SOLN
INTRAMUSCULAR | Status: AC
  Filled 2019-06-14: qty 60

## 2019-06-14 MED ORDER — CHLORHEXIDINE GLUCONATE 4 % EX LIQD: 4.0000 "application " | Freq: Once | CUTANEOUS | Status: DC

## 2019-06-14 MED ORDER — VANCOMYCIN HCL IN DEXTROSE 1-5 GM/200ML-% IV SOLN
1000.0000 mg | INTRAVENOUS | Status: AC
  Administered 2019-06-14: 1000 mg via INTRAVENOUS
  Filled 2019-06-14: qty 200

## 2019-06-14 MED ORDER — SODIUM CHLORIDE 0.9 % IV SOLN
INTRAVENOUS | Status: AC
  Filled 2019-06-14: qty 2

## 2019-06-14 MED ORDER — ACETAMINOPHEN 325 MG PO TABS: 325.0000 mg | ORAL_TABLET | ORAL | Status: DC | PRN

## 2019-06-14 SURGICAL SUPPLY — 4 items
CABLE SURGICAL S-101-97-12 (CABLE) ×2 IMPLANT
PACEMAKER ASSURITY DR-RF (Pacemaker) ×2 IMPLANT
PAD PRO RADIOLUCENT 2001M-C (PAD) ×2 IMPLANT
TRAY PACEMAKER INSERTION (PACKS) ×2 IMPLANT

## 2019-06-14 NOTE — Discharge Instructions (Signed)

## 2019-06-14 NOTE — H&P (Signed)
Show:Clear all  ManualTemplateCopied Added by:  Baldwin [place], [name] [name] Hover for details                                                                                 Cardiology Office Note  Date: 06/10/2019  Patient ID: [name] [name] Sep 17, 1945, MRN [id number]  PCP: Buzzy Han, MD  Electrophysiologist: Dr. Lovena Le  Chief Complaint: device at ERI  History of Present Illness:  [name] [name] is a 73 y.o. male with history of hypothyroidism, HLD, CHB w/PPM.  He comes in today to be seen for Dr. Lovena Le, last seen by him in Dec 2019. At that time doing well, noting device was nearing ERI in the next year or so.  He is doing quite well. Retired and enjoying time with his family and grandson. He walks daily for exercise, no exertional incapacities, no CP, palpitations, no SOB or DOE. No dizzy spells, near syncope or syncope.  His PMD does his labs, manages his cholesterol  Device information:  SJM dual chamber PPM implanted 06/21/2009 by Dr. Lum Babe [facility], [place])         Past Medical History:   Diagnosis Date   . 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 History of skin cancer 04/29/2017   . History of skin cancer 04/29/2017   . Hypothyroidism 04/29/2017   . Itching 04/29/2017   . Scabies 05/28/2017    No past surgical history on file.           Current Outpatient Medications   Medication Sig Dispense Refill   . cetirizine (ZYRTEC) 10 MG tablet Take 10 mg by mouth daily.     Marland Kitchen levothyroxine (SYNTHROID, LEVOTHROID) 100 MCG tablet TAKE 1 TABLET BY MOUTH ONCE DAILY 90 tablet 0   . pravastatin (PRAVACHOL) 40 MG tablet TAKE 1 TABLET BY MOUTH EVERY DAY 90 tablet 3   . sertraline (ZOLOFT) 100 MG tablet Take 1.5 tablets (150 mg total) by mouth daily. 135 tablet 0   No current facility-administered medications for this visit.     Allergies: Penicillins and Sulfa antibiotics  Social  History: The patient reports that he has never smoked. He has never used smokeless tobacco. He reports current alcohol use. He reports that he does not use drugs.  Family History: The patient's family history includes Hypertension in his brother.  ROS: Please see the history of present illness.  All other systems are reviewed and otherwise negative.  PHYSICAL EXAM:  VS: BP 108/70  Pulse 62  Ht 6' (1.829 m)  Wt 210 lb (95.3 kg)  BMI 28.48 kg/m BMI: Body mass index is 28.48 kg/m.  Well nourished, well developed, in no acute distress  HEENT: normocephalic, atraumatic  Neck: no JVD, carotid bruits or masses  Cardiac: RRR; no significant murmurs, no rubs, or gallops  Lungs: CTA b/l, no wheezing, rhonchi or rales  Abd: soft, nontender  MS: no deformity or atrophy  Ext: no edema  Skin: warm and dry, no rash  Neuro: He speaks with a stutter (all his life he tells me), otherwise no gross deficits appreciated  Psych: euthymic mood, full affect  PPM site is stable, no tethering or discomfort  EKG: Done today and reviewed by myself shows SR, V paced  PPM interrogation done today and reviewed by myself:  Battery reached ERI 05/26/2019  Lead measurements are good  He is VP at 40bpm today  No arrhythmias  >99% VP  Recent Labs:  No results found for requested labs within last 8760 hours.  No results found for requested labs within last 8760 hours.  CrCl cannot be calculated (No successful lab value found.).          Wt Readings from Last 3 Encounters:    06/10/19 210 lb (95.3 kg)    12/05/18 200 lb (90.7 kg)    06/03/18 206 lb (93.4 kg)     Other studies reviewed:  Additional studies/records reviewed today include: summarized above  ASSESSMENT AND PLAN:  1. PPM  reached ERI  He is pacer dependent at 40bpm today  I discussed generator change procedure, potential risks and benefits, he is agreeable to proceed  We will schedule at his next convenience, routine follow up afterwards    Disposition: F/u with as above  Current medicines are reviewed at length with the patient today. The patient did not have any concerns regarding medicines.  [name] Fredrickson, [name] [name]  06/10/2019 11:35 AM       EP "Attending  Patient seen and examined. Agree with the findings as noted above. The patient presents today for PPM gen change out. He has CHB and a very slow vr. He has reached ERI. I have reviewed the indications/risks/benefits/goals/expectations of PPM insertion and he wishes to proceed.  [name] [name].D.

## 2019-06-15 MED FILL — Lidocaine HCl Local Inj 1%: INTRAMUSCULAR | Qty: 40 | Status: AC

## 2019-06-15 MED FILL — Fentanyl Citrate Preservative Free (PF) Inj 100 MCG/2ML: INTRAMUSCULAR | Qty: 2 | Status: AC

## 2019-06-15 MED FILL — Midazolam HCl Inj 5 MG/5ML (Base Equivalent): INTRAMUSCULAR | Qty: 5 | Status: AC

## 2019-06-29 ENCOUNTER — Ambulatory Visit (INDEPENDENT_AMBULATORY_CARE_PROVIDER_SITE_OTHER): Payer: Medicare Other | Admitting: *Deleted

## 2019-06-29 ENCOUNTER — Other Ambulatory Visit: Payer: Self-pay

## 2019-06-29 DIAGNOSIS — I442 Atrioventricular block, complete: Secondary | ICD-10-CM | POA: Diagnosis not present

## 2019-06-29 LAB — CUP PACEART INCLINIC DEVICE CHECK
Battery Remaining Longevity: 121 mo
Battery Voltage: 3.1 V
Brady Statistic RA Percent Paced: 49 %
Brady Statistic RV Percent Paced: 99.96 %
Date Time Interrogation Session: 20210105110600
Implantable Lead Implant Date: 20101229
Implantable Lead Implant Date: 20101229
Implantable Lead Location: 753859
Implantable Lead Location: 753860
Implantable Pulse Generator Implant Date: 20201221
Lead Channel Impedance Value: 425 Ohm
Lead Channel Impedance Value: 525 Ohm
Lead Channel Pacing Threshold Amplitude: 0.625 V
Lead Channel Pacing Threshold Amplitude: 0.75 V
Lead Channel Pacing Threshold Pulse Width: 0.5 ms
Lead Channel Pacing Threshold Pulse Width: 0.5 ms
Lead Channel Sensing Intrinsic Amplitude: 2.6 mV
Lead Channel Setting Pacing Amplitude: 0.875
Lead Channel Setting Pacing Amplitude: 1.75 V
Lead Channel Setting Pacing Pulse Width: 0.5 ms
Lead Channel Setting Sensing Sensitivity: 4 mV
Pulse Gen Model: 2272
Pulse Gen Serial Number: 9190527

## 2019-06-29 NOTE — Progress Notes (Signed)
Wound check appointment. Steri-strips removed. Wound without redness or edema. Incision edges approximated, wound well healed. Normal device function. Thresholds, sensing, and impedances consistent with implant measurements. Device programmed at chronic settings due to mature leads, Generator change only. Histogram distribution appropriate for patient and level of activity. No mode switches or high ventricular rates noted. Patient educated about wound care.  ROV 09/22/19 with Dr Ladona Ridgel. Next remote scheduled for 09/13/19.

## 2019-06-29 NOTE — Patient Instructions (Signed)
Call the office if you have any redness, drainage or swelling at wound site.

## 2019-07-01 ENCOUNTER — Encounter: Payer: Medicare Other | Admitting: Internal Medicine

## 2019-07-10 NOTE — Progress Notes (Signed)
PPM remote 

## 2019-08-06 ENCOUNTER — Ambulatory Visit: Payer: Medicare Other | Attending: Internal Medicine

## 2019-08-06 DIAGNOSIS — Z23 Encounter for immunization: Secondary | ICD-10-CM | POA: Insufficient documentation

## 2019-08-06 NOTE — Progress Notes (Signed)
   Covid-19 Vaccination Clinic  Name:  TEDDY REBSTOCK    MRN: [id number] DOB: 08-09-1945  08/06/2019  Mr. Rana was observed post Covid-19 immunization for 15 minutes without incidence. He was provided with Vaccine Information Sheet and instruction to access the V-Safe system.   Mr. Ritthaler was instructed to call 911 with any severe reactions post vaccine: Marland Kitchen Difficulty breathing  . Swelling of your face and throat  . A fast heartbeat  . A bad rash all over your body  . Dizziness and weakness    Immunizations Administered    Name Date Dose VIS Date Route   Pfizer COVID-19 Vaccine 08/06/2019  6:13 PM 0.3 mL 06/04/2019 Intramuscular   Manufacturer: [employer], [employer]   Lot: [id number]   NDC: [address]-0

## 2019-08-12 ENCOUNTER — Telehealth: Payer: Self-pay | Admitting: Internal Medicine

## 2019-08-12 NOTE — Telephone Encounter (Signed)
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 Due to the patient having a pace maker they just want to be on the safe side.   Fax number: [phone] Attention to Radiology Oncology.

## 2019-08-17 ENCOUNTER — Telehealth: Payer: Self-pay

## 2019-08-17 NOTE — Telephone Encounter (Signed)
error 

## 2019-08-17 NOTE — Telephone Encounter (Signed)
Clearance for radiation faxed as requested.

## 2019-08-29 ENCOUNTER — Ambulatory Visit: Payer: Medicare Other | Attending: Internal Medicine

## 2019-08-29 DIAGNOSIS — Z23 Encounter for immunization: Secondary | ICD-10-CM | POA: Insufficient documentation

## 2019-08-29 NOTE — Progress Notes (Signed)
   Covid-19 Vaccination Clinic  Name:  [name] [name]    MRN: [id number] DOB: [birth year]  08/29/2019  [name] was observed post Covid-19 immunization for 15 minutes without incident. He was provided with Vaccine Information Sheet and instruction to access the V-Safe system.   [name] was instructed to call 911 with any severe reactions post vaccine: Marland Kitchen Difficulty breathing  . Swelling of face and throat  . A fast heartbeat  . A bad rash all over body  . Dizziness and weakness   Immunizations Administered    Name Date Dose VIS Date Route   Pfizer COVID-19 Vaccine 08/29/2019  8:54 AM 0.3 mL 06/04/2019 Intramuscular   Manufacturer: [employer], [employer]   Lot: [id number]   NDC: [address]-9

## 2019-09-22 ENCOUNTER — Other Ambulatory Visit: Payer: Self-pay

## 2019-09-22 ENCOUNTER — Encounter: Payer: Self-pay | Admitting: Internal Medicine

## 2019-09-22 ENCOUNTER — Ambulatory Visit (INDEPENDENT_AMBULATORY_CARE_PROVIDER_SITE_OTHER): Payer: Medicare Other | Admitting: Internal Medicine

## 2019-09-22 VITALS — BP 122/72 | HR 70 | Ht 74.0 in | Wt 203.0 lb

## 2019-09-22 DIAGNOSIS — I442 Atrioventricular block, complete: Secondary | ICD-10-CM | POA: Diagnosis not present

## 2019-09-22 DIAGNOSIS — Z950 Presence of cardiac pacemaker: Secondary | ICD-10-CM

## 2019-09-22 NOTE — Patient Instructions (Signed)

## 2019-09-22 NOTE — Progress Notes (Signed)
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 Burr [name]. He is a 74 yo man with CHB, s/p PPM insertion, dyslipidemia, thyroid dysfunction, underwent PPM insertion with a generator change out 3 months ago. He denies chest pain, or sob. No syncope. 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 Allergies  Allergen Reactions  . Egg White (Diagnostic)     Showed up on allergy test  . Penicillins     Did it involve swelling of the face/tongue/throat, SOB, or low BP? No Did it involve sudden or severe rash/hives, skin peeling, or any reaction on the inside of your mouth or nose? No Did you need to seek medical attention at a hospital or doctor's office? Unknown When did it last happen?Childhood allergy If all above answers are "NO", may proceed with cephalosporin use.   . Shrimp [Shellfish Allergy]     Showed up on allergy test  . Wheat Bran     Showed up on allergy test  . Sulfa Antibiotics Rash     Current Outpatient Medications  Medication Sig Dispense Refill  . acetaminophen (TYLENOL) 500 MG tablet Take 500-1,000 mg by mouth every 6 (six) hours as needed for moderate pain or headache.    . Cyanocobalamin (B-12 PO) Take 1 capsule by mouth daily.    . hydrOXYzine (ATARAX/VISTARIL) 25 MG tablet Take 25 mg by mouth 2 (two) times daily.    Marland Kitchen levothyroxine (SYNTHROID, LEVOTHROID) 100 MCG tablet TAKE 1 TABLET BY MOUTH ONCE DAILY 90 tablet 0  . MELATONIN PO Take 1 tablet by mouth at bedtime.    . Multiple Vitamin (MULTIVITAMIN WITH MINERALS) TABS tablet Take 1 tablet by mouth daily.    . pravastatin (PRAVACHOL) 40 MG tablet TAKE 1 TABLET BY MOUTH EVERY DAY 90 tablet 3  . sertraline (ZOLOFT) 100 MG tablet Take 1.5 tablets (150 mg total) by mouth daily. 135 tablet 0  . VITAMIN D PO Take 1 capsule by mouth daily.     No current facility-administered medications for this visit.     Past Medical  History:  Diagnosis Date  . 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 History of skin cancer 04/29/2017  . History of skin cancer 04/29/2017  . Hypothyroidism 04/29/2017  . Itching 04/29/2017  . Scabies 05/28/2017    ROS:   All systems reviewed and negative except as noted in the HPI.   Past Surgical History:  Procedure Laterality Date  . PPM GENERATOR CHANGEOUT N/A 06/14/2019   Procedure: PPM GENERATOR CHANGEOUT;  Surgeon: Evans Lance, MD;  Location: [place] [facility];  Service: Cardiovascular;  Laterality: N/A;     Family History  Problem Relation Age of Onset  . Hypertension Brother      Social History   Socioeconomic History  . Marital status: Divorced    Spouse name: Not on file  . Number of children: Not on file  . Years of education: Not on file  . Highest education level: Not on file  Occupational History  . Not on file  Tobacco Use  . Smoking status: Never Smoker  . Smokeless tobacco: Never Used  Substance and Sexual Activity  . Alcohol use: Yes  . Drug use: No  . Sexual activity: Not on file  Other Topics Concern  . Not on file  Social History Narrative  . Not on file   Social Determinants of Health  Financial Resource Strain:   . Difficulty of Paying Living Expenses:   Food Insecurity:   . Worried About Programme researcher, broadcasting/film/video in the Last Year:   . Barista in the Last Year:   Transportation Needs:   . Freight forwarder (Medical):   Marland Kitchen Lack of Transportation (Non-Medical):   Physical Activity:   . Days of Exercise per Week:   . Minutes of Exercise per Session:   Stress:   . Feeling of Stress :   Social Connections:   . Frequency of Communication with Friends and Family:   . Frequency of Social Gatherings with Friends and Family:   . Attends Religious Services:   . Active Member of Clubs or Organizations:   . Attends Banker Meetings:   Marland Kitchen Marital Status:   Intimate Partner Violence:   . Fear of Current or Ex-Partner:    . Emotionally Abused:   Marland Kitchen Physically Abused:   . Sexually Abused:      BP 122/72   Pulse 70   Ht 6\' 2"  (1.88 m)   Wt 203 lb (92.1 kg)   SpO2 98%   BMI 26.06 kg/m   Physical Exam:  Well appearing 74 yo man, NAD HEENT: Unremarkable Neck:  No JVD, no thyromegally Lymphatics:  No adenopathy Back:  No CVA tenderness Lungs:  Clear with no wheezes HEART:  Regular rate rhythm, no murmurs, no rubs, no clicks Abd:  soft, positive bowel sounds, no organomegally, no rebound, no guarding Ext:  2 plus pulses, 2+ edema left leg, no cyanosis, no clubbing Skin:  No rashes no nodules Neuro:  CN II through XII intact, motor grossly intact  EKG -  P synchronous ventricular pacing  DEVICE  Normal device function.  See PaceArt for details.   Assess/Plan: 1. CHB - he is asymptomatic, s/p PPM insertion.  2. PPM - he is dependent today with no AV node conduction. His new [employer] DDD PM is working normally. 3. Dyslipidemia - he will continue his pravastatin.  66.D.

## 2019-09-28 ENCOUNTER — Telehealth: Payer: Self-pay

## 2019-09-28 NOTE — Telephone Encounter (Signed)
New message   Patient has questions about if his transmission was received. Please call.

## 2019-09-29 NOTE — Telephone Encounter (Signed)
LMOVM letting pt know we did not receive his transmission. I also left my direct office number for the pt to call if he needs help.

## 2019-09-30 NOTE — Telephone Encounter (Signed)
LMOVM for pt to return my call.  

## 2019-10-01 NOTE — Telephone Encounter (Signed)
LMOVM for pt to send manual transmission and to return my phone call. 

## 2019-10-06 NOTE — Telephone Encounter (Signed)
Letter sent 10-06-2019

## 2019-10-08 ENCOUNTER — Ambulatory Visit (INDEPENDENT_AMBULATORY_CARE_PROVIDER_SITE_OTHER): Payer: Medicare Other | Admitting: *Deleted

## 2019-10-08 DIAGNOSIS — I442 Atrioventricular block, complete: Secondary | ICD-10-CM

## 2019-10-08 LAB — CUP PACEART REMOTE DEVICE CHECK
Battery Remaining Longevity: 119 mo
Battery Remaining Percentage: 95.5 %
Battery Voltage: 3.04 V
Brady Statistic AP VP Percent: 46 %
Brady Statistic AP VS Percent: 1 %
Brady Statistic AS VP Percent: 54 %
Brady Statistic AS VS Percent: 1 %
Brady Statistic RA Percent Paced: 46 %
Brady Statistic RV Percent Paced: 99 %
Date Time Interrogation Session: 20210416153756
Implantable Lead Implant Date: 20101229
Implantable Lead Implant Date: 20101229
Implantable Lead Location: 753859
Implantable Lead Location: 753860
Implantable Pulse Generator Implant Date: 20201221
Lead Channel Impedance Value: 430 Ohm
Lead Channel Impedance Value: 530 Ohm
Lead Channel Pacing Threshold Amplitude: 0.75 V
Lead Channel Pacing Threshold Amplitude: 0.75 V
Lead Channel Pacing Threshold Pulse Width: 0.5 ms
Lead Channel Pacing Threshold Pulse Width: 0.5 ms
Lead Channel Sensing Intrinsic Amplitude: 11.4 mV
Lead Channel Sensing Intrinsic Amplitude: 3 mV
Lead Channel Setting Pacing Amplitude: 1 V
Lead Channel Setting Pacing Amplitude: 1.75 V
Lead Channel Setting Pacing Pulse Width: 0.5 ms
Lead Channel Setting Sensing Sensitivity: 4 mV
Pulse Gen Model: 2272
Pulse Gen Serial Number: 9190527

## 2019-10-08 NOTE — Progress Notes (Signed)
PPM Remote  

## 2019-10-08 NOTE — Telephone Encounter (Signed)
I tried to help the pt send a transmission but was unsuccessful. I called [employer] and apparently his model/serial number was not updated in [employer]. The  [employer] rep is trying to pair the pt new device with his old monitor. If she can not pair the device she is going to send him a new monitor.

## 2019-10-08 NOTE — Telephone Encounter (Signed)
Transmission received.

## 2019-11-02 ENCOUNTER — Encounter (INDEPENDENT_AMBULATORY_CARE_PROVIDER_SITE_OTHER): Payer: Medicare Other | Admitting: Ophthalmology

## 2019-11-02 NOTE — Progress Notes (Signed)
[facility] Note  11/05/2019     CHIEF COMPLAINT Patient presents for Retina Evaluation   HISTORY OF PRESENT ILLNESS: [name] [name] is a 74 y.o. male who presents to the clinic today for:   HPI    Retina Evaluation    In right eye.  Duration of 4 days.  Associated Symptoms Redness.  Treatments tried include no treatments.  I, the attending physician,  performed the HPI with the patient and updated documentation appropriately.          Comments    Retina Eval per Dr. Parke Simmers- possible BRVO with edema OD. Pt states after his appt Monday he has had an irritation RLL, redness, and dryness mostly in the corner.   Pt was in for a regular eye exam, had not noticed any changes with vision.        Last edited by Bernarda Caffey, MD on 11/05/2019  1:55 PM. (History)    pt states he had an eye problem (stye) about 6 months ago and saw Dr. Parke Simmers, pt states Dr. 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 [place] [name] [name] for concern of vein occlusion OD LEE:  Ocular Hx- PMH-   CURRENT MEDICATIONS: Current Outpatient Medications (Ophthalmic Drugs)  Medication Sig  . ofloxacin (OCUFLOX) 0.3 % ophthalmic solution Place 1 drop into the right eye 4 (four) times daily for 10 days.   No current facility-administered medications for this visit. (Ophthalmic Drugs)   Current  Outpatient Medications (Other)  Medication Sig  . acetaminophen (TYLENOL) 500 MG tablet Take 500-1,000 mg by mouth every 6 (six) hours as needed for moderate pain or headache.  . Cyanocobalamin (B-12 PO) Take 1 capsule by mouth daily.  . hydrOXYzine (ATARAX/VISTARIL) 25 MG tablet Take 25 mg by mouth 2 (two) times daily.  Marland Kitchen levothyroxine (SYNTHROID, LEVOTHROID) 100 MCG tablet TAKE 1 TABLET BY MOUTH ONCE DAILY  . MELATONIN PO Take 1 tablet by mouth at bedtime.  . Multiple Vitamin (MULTIVITAMIN WITH MINERALS) TABS tablet Take 1 tablet by mouth daily.  . pravastatin (PRAVACHOL) 40 MG tablet TAKE 1 TABLET BY MOUTH EVERY [name]  . sertraline (ZOLOFT) 100 MG tablet Take 1.5 tablets (150 mg total) by mouth daily.  Marland Kitchen VITAMIN D PO Take 1 capsule by mouth daily.   No current facility-administered medications for this visit. (Other)      REVIEW OF SYSTEMS: ROS    Positive for: Endocrine, Cardiovascular, Eyes   Last edited by Leonie Douglas, COA on 11/05/2019  1:24 PM. (History)       ALLERGIES Allergies  Allergen Reactions  . Egg White (Diagnostic)     Showed up on allergy test  . Penicillins     Did it involve swelling of the face/tongue/throat, SOB, or low BP? No Did it involve sudden or severe  rash/hives, skin peeling, or any reaction on the inside of your mouth or nose? No Did you need to seek medical attention at a hospital or doctor's office? Unknown When did it last happen?Childhood allergy If all above answers are "NO", may proceed with cephalosporin use.   . Shrimp [Shellfish Allergy]     Showed up on allergy test  . Wheat Bran     Showed up on allergy test  . Sulfa Antibiotics Rash    PAST MEDICAL HISTORY Past Medical History:  Diagnosis Date  . 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 History of skin cancer 04/29/2017  . History of skin cancer 04/29/2017  . Hypothyroidism 04/29/2017  . Itching 04/29/2017  . Scabies 05/28/2017   Past Surgical History:  Procedure  Laterality Date  . PPM GENERATOR CHANGEOUT N/A 06/14/2019   Procedure: PPM GENERATOR CHANGEOUT;  Surgeon: [name], MD;  Location: [facility] INVASIVE CV LAB;  Service: Cardiovascular;  Laterality: N/A;    FAMILY HISTORY Family History  Problem Relation Age of Onset  . Hypertension Brother     SOCIAL HISTORY Social History   Tobacco Use  . Smoking status: Never Smoker  . Smokeless tobacco: Never Used  Substance Use Topics  . Alcohol use: Yes  . Drug use: No         OPHTHALMIC EXAM:  Base Eye Exam    Visual Acuity (Snellen - Linear)      Right Left   Dist [place] 20/20 20/25       Tonometry (Tonopen, 1:34 PM)      Right Left   Pressure 14 14       Pupils      Shape React APD   Right Round Brisk None   Left Round Brisk None       Visual Fields (Counting fingers)      Left Right    Full Full       Extraocular Movement      Right Left    Full Full       Neuro/Psych    Oriented x3: Yes   Mood/Affect: Normal       Dilation    Both eyes: 1.0% Mydriacyl, 2.5% Phenylephrine @ 1:34 PM        Slit Lamp and Fundus Exam    Slit Lamp Exam      Right Left   Lids/Lashes Dermatochalasis - upper lid, Meibomian gland dysfunction, inflammed Hordeolum - lower lid Dermatochalasis - upper lid, Meibomian gland dysfunction   Conjunctiva/Sclera White and quiet White and quiet   Cornea 2+ Punctate epithelial erosions 1+ Punctate epithelial erosions   Anterior Chamber Deep and quiet Deep and quiet   Iris Round and dilated Round and dilated   Lens 2-3+ Nuclear sclerosis, 2-3+ Cortical cataract 2-3+ Nuclear sclerosis, 2-3+ Cortical cataract   Vitreous Vitreous syneresis Vitreous syneresis       Fundus Exam      Right Left   Disc Pink and Sharp Pink and Sharp   C/D Ratio 0.4 0.5   Macula Blunted foveal reflex, focal IRH and edema IT to fovea Flat, Good foveal reflex, Retinal pigment epithelial mottling, mild peripapillary pigmentation   Vessels Vascular attenuation,  Tortuous greatest inferior temporal arcades, AV crossing changes Vascular attenuation, Tortuous greatest inferior temporal arcades, AV crossing changes   Periphery Attached    Attached           Refraction    Manifest Refraction      Sphere Cylinder Axis  Dist [place]   Right [place]   20/20   Left -0.25 +0.50 020 20/20          IMAGING AND PROCEDURES  Imaging and Procedures for @TODAY @  OCT, Retina - OU - Both Eyes       Right Eye Quality was good. Central Foveal Thickness: 476. Progression has no prior data. Findings include abnormal foveal contour, intraretinal fluid, no SRF (Partial PVD).   Left Eye Quality was good. Central Foveal Thickness: 327. Progression has no prior data. Findings include normal foveal contour, no IRF, no SRF.   Notes *Images captured and stored on drive  Diagnosis / Impression:  OD: +IRF, no SRF, ?BRVO OS: NFP, no IRF/SRF  Clinical management:  See below  Abbreviations: NFP - Normal foveal profile. CME - cystoid macular edema. PED - pigment epithelial detachment. IRF - intraretinal fluid. SRF - subretinal fluid. EZ - ellipsoid zone. ERM - epiretinal membrane. ORA - outer retinal atrophy. ORT - outer retinal tubulation. SRHM - subretinal hyper-reflective material        Fluorescein Angiography Optos (Transit OD)       Right Eye   Progression has no prior data. Early phase findings include vascular perfusion defect, microaneurysm. Mid/Late phase findings include leakage (telangectasia's inferior macula with late leakage -- inferior, remote BRVO).   Left Eye   Progression has no prior data. Early phase findings include normal observations. Mid/Late phase findings include normal observations.   Notes **Images stored on drive**  Impression: OD: remote, focal, inferior BRVO with leakage OS: normal study         Intravitreal Injection, Pharmacologic Agent - OD - Right Eye       Time Out 11/05/2019. 2:52 PM. Confirmed correct  patient, procedure, site, and patient consented.   Anesthesia Topical anesthesia was used. Anesthetic medications included Lidocaine 2%, Proparacaine 0.5%.   Procedure Preparation included 5% betadine to ocular surface, eyelid speculum. A supplied needle was used.   Injection:  1.25 mg Bevacizumab (AVASTIN) SOLN   NDC: 29562-130-8650242-060-01, Lot: [id number] , Expiration date: 01/05/2020   Route: Intravitreal, Site: Right Eye, Waste: 0 mL  Post-op Post injection exam found visual acuity of at least counting fingers. The patient tolerated the procedure well. There were no complications. The patient received written and verbal post procedure care education.                 ASSESSMENT/PLAN:    ICD-10-CM   1. Branch retinal vein occlusion of right eye with macular edema  H34.8310 Intravitreal Injection, Pharmacologic Agent - OD - Right Eye    Bevacizumab (AVASTIN) SOLN 1.25 mg  2. Retinal edema  H35.81 OCT, Retina - OU - Both Eyes  3. Essential hypertension  I10   4. Hypertensive retinopathy of both eyes  H35.033 Fluorescein Angiography Optos (Transit OD)  5. Combined forms of age-related cataract of both eyes  H25.813   6. Hordeolum externum of right lower eyelid  H00.012     1,2. BRVO with CME OD  - The natural history of retinal vein occlusion and macular edema and treatment options including observation, laser photocoagulation, and intravitreal antiVEGF injection with Avastin and Lucentis and Eylea and intravitreal injection of steroids with triamcinolone and Ozurdex and the complications of these procedures including loss of vision, infection, cataract, glaucoma, and retinal detachment were discussed with patient.  - Specifically discussed findings from CRUISE / BRAVO study regarding patient stabilization with anti-VEGF agents and increased potential for visual improvements.  Also discussed need for  frequent follow up and potentially multiple injections given the chronic nature of the  disease process  - BCVA 20/20 OD  - OCT shows +IRF, no SRF  - FA 5.14.21 shows +focal telangectasias with late leakage inferior macula consistent with remote inf BRVO  - recommend IVA OD #1 today, 05.14.21  - pt wishes to proceed  - RBA of procedure discussed, questions answered  - informed consent obtained, signed and scanned  - see procedure note  - F/U 4 weeks -- DFE/OCT/possible injection  3,4. Hypertensive retinopathy OU  - discussed importance of tight BP control  - monitor  5. Mixed form cataracts OU  - The symptoms of cataract, surgical options, and treatments and risks were discussed with patient.  - discussed diagnosis and progression  - not yet visually significant  - monitor for now  6. External hordeolum OD -- lower lid  - recommend ofloxacin gtts QID OD + hot compresses   Ophthalmic Meds Ordered this visit:  Meds ordered this encounter  Medications  . ofloxacin (OCUFLOX) 0.3 % ophthalmic solution    Sig: Place 1 drop into the right eye 4 (four) times daily for 10 days.    Dispense:  5 mL    Refill:  0  . Bevacizumab (AVASTIN) SOLN 1.25 mg       Return in about 4 weeks (around 12/03/2019) for f/u BRVO OD, DFE, OCT.  There are no Patient Instructions on file for this visit.   Explained the diagnoses, plan, and follow up with the patient and they expressed understanding.  Patient expressed understanding of the importance of proper follow up care.   This document serves as a record of services personally performed by Karie Chimera, MD, PhD. It was created on their behalf by Laurian Brim, OA, an ophthalmic assistant. The creation of this record is the provider's dictation and/or activities during the visit.    Electronically signed by: Laurian Brim, OA 05.11.2021 11:31 PM   Karie Chimera, M.D., Ph.D. Diseases & Surgery of the Retina and Vitreous [facility]  I have reviewed the above documentation for accuracy and completeness,  and I agree with the above. Karie Chimera, M.D., Ph.D. 11/07/19 11:31 PM   Abbreviations: M myopia (nearsighted); A astigmatism; H hyperopia (farsighted); P presbyopia; Mrx spectacle prescription;  CTL contact lenses; OD right eye; OS left eye; OU both eyes  XT exotropia; ET esotropia; PEK punctate epithelial keratitis; PEE punctate epithelial erosions; DES dry eye syndrome; MGD meibomian gland dysfunction; ATs artificial tears; PFAT's preservative free artificial tears; NSC nuclear sclerotic cataract; PSC posterior subcapsular cataract; ERM epi-retinal membrane; PVD posterior vitreous detachment; RD retinal detachment; DM diabetes mellitus; DR diabetic retinopathy; NPDR non-proliferative diabetic retinopathy; PDR proliferative diabetic retinopathy; CSME clinically significant macular edema; DME diabetic macular edema; dbh dot blot hemorrhages; CWS cotton wool spot; POAG primary open angle glaucoma; C/D cup-to-disc ratio; HVF humphrey visual field; GVF goldmann visual field; OCT optical coherence tomography; IOP intraocular pressure; BRVO Branch retinal vein occlusion; CRVO central retinal vein occlusion; CRAO central retinal artery occlusion; BRAO branch retinal artery occlusion; RT retinal tear; SB scleral buckle; PPV pars plana vitrectomy; VH Vitreous hemorrhage; PRP panretinal laser photocoagulation; IVK intravitreal kenalog; VMT vitreomacular traction; MH Macular hole;  NVD neovascularization of the disc; NVE neovascularization elsewhere; AREDS age related eye disease study; ARMD age related macular degeneration; POAG primary open angle glaucoma; EBMD epithelial/anterior basement membrane dystrophy; ACIOL anterior chamber intraocular lens; IOL intraocular lens; PCIOL posterior chamber  intraocular lens; Phaco/IOL phacoemulsification with intraocular lens placement; PRK photorefractive keratectomy; LASIK laser assisted in situ keratomileusis; HTN hypertension; DM diabetes mellitus; COPD chronic obstructive  pulmonary disease

## 2019-11-05 ENCOUNTER — Ambulatory Visit (INDEPENDENT_AMBULATORY_CARE_PROVIDER_SITE_OTHER): Payer: Medicare Other | Admitting: Ophthalmology

## 2019-11-05 ENCOUNTER — Other Ambulatory Visit: Payer: Self-pay

## 2019-11-05 DIAGNOSIS — H35033 Hypertensive retinopathy, bilateral: Secondary | ICD-10-CM

## 2019-11-05 DIAGNOSIS — H34831 Tributary (branch) retinal vein occlusion, right eye, with macular edema: Secondary | ICD-10-CM

## 2019-11-05 DIAGNOSIS — I10 Essential (primary) hypertension: Secondary | ICD-10-CM | POA: Diagnosis not present

## 2019-11-05 DIAGNOSIS — H25813 Combined forms of age-related cataract, bilateral: Secondary | ICD-10-CM

## 2019-11-05 DIAGNOSIS — H3581 Retinal edema: Secondary | ICD-10-CM

## 2019-11-05 DIAGNOSIS — H00012 Hordeolum externum right lower eyelid: Secondary | ICD-10-CM

## 2019-11-05 MED ORDER — OFLOXACIN 0.3 % OP SOLN: 1.0000 [drp] | Freq: Four times a day (QID) | OPHTHALMIC | 0 refills | Status: AC

## 2019-11-07 ENCOUNTER — Encounter (INDEPENDENT_AMBULATORY_CARE_PROVIDER_SITE_OTHER): Payer: Self-pay | Admitting: Ophthalmology

## 2019-11-07 MED ORDER — BEVACIZUMAB CHEMO INJECTION 1.25MG/0.05ML SYRINGE FOR KALEIDOSCOPE
1.2500 mg | INTRAVITREAL | Status: AC | PRN
  Administered 2019-11-07: 1.25 mg via INTRAVITREAL

## 2019-12-06 NOTE — Progress Notes (Addendum)
[facility] - Clinic Note  12/09/2019     CHIEF COMPLAINT Patient presents for Retina Follow Up   HISTORY OF PRESENT ILLNESS: [name] [name] is a 74 y.o. male who presents to the clinic today for:   HPI    Retina Follow Up    Patient presents with  CRVO/BRVO.  In right eye.  This started 4 weeks ago.  Severity is moderate.  I, the attending physician,  performed the HPI with the patient and updated documentation appropriately.          Comments    Patient here for 4 weeks retina follow up for BRVO OD. Patient states vision doing fine. No eye pain.       Last edited by [name], MD on 12/09/2019 11:07 PM. (History)    pt states    Referring physician: Margot Ables, MD [address] [place],  [place] [address]  HISTORICAL INFORMATION:   Selected notes from the MEDICAL RECORD NUMBER Referred by [name] for concern of vein occlusion OD LEE:  Ocular Hx- PMH-   CURRENT MEDICATIONS: No current outpatient medications on file. (Ophthalmic Drugs)   No current facility-administered medications for this visit. (Ophthalmic Drugs)   Current Outpatient Medications (Other)  Medication Sig  . acetaminophen (TYLENOL) 500 MG tablet Take 500-1,000 mg by mouth every 6 (six) hours as needed for moderate pain or headache.  . Cyanocobalamin (B-12 PO) Take 1 capsule by mouth daily.  . hydrOXYzine (ATARAX/VISTARIL) 25 MG tablet Take 25 mg by mouth 2 (two) times daily.  Marland Kitchen levothyroxine (SYNTHROID, LEVOTHROID) 100 MCG tablet TAKE 1 TABLET BY MOUTH ONCE DAILY  . MELATONIN PO Take 1 tablet by mouth at bedtime.  . Multiple Vitamin (MULTIVITAMIN WITH MINERALS) TABS tablet Take 1 tablet by mouth daily.  . pravastatin (PRAVACHOL) 40 MG tablet TAKE 1 TABLET BY MOUTH EVERY DAY  . sertraline (ZOLOFT) 100 MG tablet Take 1.5 tablets (150 mg total) by mouth daily.  Marland Kitchen VITAMIN D PO Take 1 capsule by mouth daily.   No current facility-administered  medications for this visit. (Other)      REVIEW OF SYSTEMS: ROS    Positive for: Endocrine, Cardiovascular, Eyes   Last edited by Laddie Aquas, COA on 12/09/2019  9:14 AM. (History)       ALLERGIES Allergies  Allergen Reactions  . Egg White (Diagnostic)     Showed up on allergy test  . Penicillins     Did it involve swelling of the face/tongue/throat, SOB, or low BP? No Did it involve sudden or severe rash/hives, skin peeling, or any reaction on the inside of your mouth or nose? No Did you need to seek medical attention at a hospital or doctor's office? Unknown When did it last happen?Childhood allergy If all above answers are "NO", may proceed with cephalosporin use.   . Shrimp [Shellfish Allergy]     Showed up on allergy test  . Wheat Bran     Showed up on allergy test  . Sulfa Antibiotics Rash    PAST MEDICAL HISTORY Past Medical History:  Diagnosis Date  . 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 History of skin cancer 04/29/2017  . History of skin cancer 04/29/2017  . Hypothyroidism 04/29/2017  . Itching 04/29/2017  . Scabies 05/28/2017   Past Surgical History:  Procedure Laterality Date  . PPM GENERATOR CHANGEOUT N/A 06/14/2019   Procedure: PPM GENERATOR CHANGEOUT;  Surgeon: [name], MD;  Location: [facility]  INVASIVE CV LAB;  Service: Cardiovascular;  Laterality: N/A;    FAMILY HISTORY Family History  Problem Relation Age of Onset  . Hypertension Brother     SOCIAL HISTORY Social History   Tobacco Use  . Smoking status: Never Smoker  . Smokeless tobacco: Never Used  Substance Use Topics  . Alcohol use: Yes  . Drug use: No         OPHTHALMIC EXAM:  Base Eye Exam    Visual Acuity (Snellen - Linear)      Right Left   Dist [place] 20/20 -2 20/30 +2   Dist ph [place]  20/20       Tonometry (Tonopen, 9:12 AM)      Right Left   Pressure 15 16       Pupils      Dark Light Shape React APD   Right 4 3 Round Brisk None   Left 4 3 Round Brisk  None       Visual Fields (Counting fingers)      Left Right    Full Full       Extraocular Movement      Right Left    Full, Ortho Full, Ortho       Neuro/Psych    Oriented x3: Yes   Mood/Affect: Normal       Dilation    Both eyes: 1.0% Mydriacyl, 2.5% Phenylephrine @ 9:12 AM        Slit Lamp and Fundus Exam    Slit Lamp Exam      Right Left   Lids/Lashes Dermatochalasis - upper lid, Meibomian gland dysfunction, inflammed Hordeolum - lower lid - improving Dermatochalasis - upper lid, Meibomian gland dysfunction   Conjunctiva/Sclera White and quiet White and quiet   Cornea 2+ Punctate epithelial erosions 1+ Punctate epithelial erosions   Anterior Chamber Deep and quiet Deep and quiet   Iris Round and dilated Round and dilated   Lens 2-3+ Nuclear sclerosis, 2-3+ Cortical cataract 2-3+ Nuclear sclerosis, 2-3+ Cortical cataract   Vitreous Vitreous syneresis Vitreous syneresis       Fundus Exam      Right Left   Disc Pink and Sharp Pink and Sharp   C/D Ratio 0.4 0.5   Macula Blunted foveal reflex, focal IRH and edema IT to fovea -- slightly improved Flat, Good foveal reflex, Retinal pigment epithelial mottling, mild peripapillary pigmentation   Vessels Vascular attenuation, Tortuous greatest inferior temporal arcades, AV crossing changes Vascular attenuation, Tortuous greatest inferior temporal arcades, AV crossing changes   Periphery Attached, no heme Attached             IMAGING AND PROCEDURES  Imaging and Procedures for @TODAY @  OCT, Retina - OU - Both Eyes       Right Eye Quality was good. Central Foveal Thickness: 458. Progression has improved. Findings include abnormal foveal contour, intraretinal fluid, no SRF (Mild interval improvement in IRF; Partial PVD).   Left Eye Quality was good. Central Foveal Thickness: 325. Progression has been stable. Findings include normal foveal contour, no IRF, no SRF (Partial PVD).   Notes *Images captured and stored on  drive  Diagnosis / Impression:  OD: +IRF, no SRF, BRVO w/ mild interval improvement in IRF OS: NFP, no IRF/SRF  Clinical management:  See below  Abbreviations: NFP - Normal foveal profile. CME - cystoid macular edema. PED - pigment epithelial detachment. IRF - intraretinal fluid. SRF - subretinal fluid. EZ - ellipsoid zone. ERM - epiretinal membrane. ORA -  outer retinal atrophy. ORT - outer retinal tubulation. SRHM - subretinal hyper-reflective material        Intravitreal Injection, Pharmacologic Agent - OD - Right Eye       Time Out 12/09/2019. 9:44 AM. Confirmed correct patient, procedure, site, and patient consented.   Anesthesia Topical anesthesia was used. Anesthetic medications included Lidocaine 2%, Proparacaine 0.5%.   Procedure Preparation included 5% betadine to ocular surface, eyelid speculum. A supplied needle was used.   Injection:  1.25 mg Bevacizumab (AVASTIN) SOLN   NDC: 65993-570-17, Lot: [id number] , Expiration date: 02/16/2020   Route: Intravitreal, Site: Right Eye, Waste: 0 mL  Post-op Post injection exam found visual acuity of at least counting fingers. The patient tolerated the procedure well. There were no complications. The patient received written and verbal post procedure care education.                 ASSESSMENT/PLAN:    ICD-10-CM   1. Branch retinal vein occlusion of right eye with macular edema  H34.8310 Intravitreal Injection, Pharmacologic Agent - OD - Right Eye    Bevacizumab (AVASTIN) SOLN 1.25 mg  2. Retinal edema  H35.81 OCT, Retina - OU - Both Eyes  3. Essential hypertension  I10   4. Hordeolum externum of right lower eyelid  H00.012   5. Combined forms of age-related cataract of both eyes  H25.813   6. Hypertensive retinopathy of both eyes  H35.033     1,2. BRVO with CME OD             - S/P IVA OD #1 (05.14.21)  - BCVA 20/20 OD  - OCT shows mild interval improvement in IRF  - FA 5.14.21 shows +focal telangectasias with  late leakage inferior macula consistent with remote inf BRVO  - recommend IVA OD #2 today, 06.14.21  - pt wishes to proceed  - RBA of procedure discussed, questions answered  - informed consent obtained, signed and scanned  - see procedure note  - F/U 4 weeks -- DFE/OCT/possible injection  3,4. Hypertensive retinopathy OU  - discussed importance of tight BP control  - monitor  5. Mixed form cataracts OU  - The symptoms of cataract, surgical options, and treatments and risks were discussed with patient.  - discussed diagnosis and progression  - not yet visually significant  - monitor for now  6. External hordeolum OD -- lower lid  - improving  - recommend ofloxacin gtts QID OD + hot compresses   Ophthalmic Meds Ordered this visit:  Meds ordered this encounter  Medications  . Bevacizumab (AVASTIN) SOLN 1.25 mg       Return in about 4 weeks (around 01/06/2020) for f/u BRVO OD, DFE, OCT.  There are no Patient Instructions on file for this visit.   Explained the diagnoses, plan, and follow up with the patient and they expressed understanding.  Patient expressed understanding of the importance of proper follow up care.   This document serves as a record of services personally performed by 01/08/2020, MD, PhD. It was created on their behalf by Karie Chimera, COA, a certified ophthalmic assistant. The creation of this record is the provider's dictation and/or activities during the visit.    Electronically signed by: Herby Abraham, COA @TODAY @ 4:48 PM   This document serves as a record of services personally performed by Herby Abraham, MD, PhD. It was created on their behalf by , OA, an ophthalmic assistant. The creation of this record is the provider's  dictation and/or activities during the visit.    Electronically signed by: Laurian Brim, OA 06.17.2021 4:48 PM   Karie Chimera, M.D., Ph.D. Diseases & Surgery of the Retina and Vitreous [facility]  I have reviewed the above documentation for accuracy and completeness, and I agree with the above. Karie Chimera, M.D., Ph.D. 12/10/19 4:48 PM   Abbreviations: M myopia (nearsighted); A astigmatism; H hyperopia (farsighted); P presbyopia; Mrx spectacle prescription;  CTL contact lenses; OD right eye; OS left eye; OU both eyes  XT exotropia; ET esotropia; PEK punctate epithelial keratitis; PEE punctate epithelial erosions; DES dry eye syndrome; MGD meibomian gland dysfunction; ATs artificial tears; PFAT's preservative free artificial tears; NSC nuclear sclerotic cataract; PSC posterior subcapsular cataract; ERM epi-retinal membrane; PVD posterior vitreous detachment; RD retinal detachment; DM diabetes mellitus; DR diabetic retinopathy; NPDR non-proliferative diabetic retinopathy; PDR proliferative diabetic retinopathy; CSME clinically significant macular edema; DME diabetic macular edema; dbh dot blot hemorrhages; CWS cotton wool spot; POAG primary open angle glaucoma; C/D cup-to-disc ratio; HVF humphrey visual field; GVF goldmann visual field; OCT optical coherence tomography; IOP intraocular pressure; BRVO Branch retinal vein occlusion; CRVO central retinal vein occlusion; CRAO central retinal artery occlusion; BRAO branch retinal artery occlusion; RT retinal tear; SB scleral buckle; PPV pars plana vitrectomy; VH Vitreous hemorrhage; PRP panretinal laser photocoagulation; IVK intravitreal kenalog; VMT vitreomacular traction; MH Macular hole;  NVD neovascularization of the disc; NVE neovascularization elsewhere; AREDS age related eye disease study; ARMD age related macular degeneration; POAG primary open angle glaucoma; EBMD epithelial/anterior basement membrane dystrophy; ACIOL anterior chamber intraocular lens; IOL intraocular lens; PCIOL posterior chamber intraocular lens; Phaco/IOL phacoemulsification with intraocular lens placement; PRK photorefractive keratectomy; LASIK laser  assisted in situ keratomileusis; HTN hypertension; DM diabetes mellitus; COPD chronic obstructive pulmonary disease

## 2019-12-09 ENCOUNTER — Other Ambulatory Visit: Payer: Self-pay

## 2019-12-09 ENCOUNTER — Encounter (INDEPENDENT_AMBULATORY_CARE_PROVIDER_SITE_OTHER): Payer: Self-pay | Admitting: Ophthalmology

## 2019-12-09 ENCOUNTER — Ambulatory Visit (INDEPENDENT_AMBULATORY_CARE_PROVIDER_SITE_OTHER): Payer: Medicare Other | Admitting: Ophthalmology

## 2019-12-09 DIAGNOSIS — I10 Essential (primary) hypertension: Secondary | ICD-10-CM | POA: Diagnosis not present

## 2019-12-09 DIAGNOSIS — H3581 Retinal edema: Secondary | ICD-10-CM | POA: Diagnosis not present

## 2019-12-09 DIAGNOSIS — H00012 Hordeolum externum right lower eyelid: Secondary | ICD-10-CM | POA: Diagnosis not present

## 2019-12-09 DIAGNOSIS — H35033 Hypertensive retinopathy, bilateral: Secondary | ICD-10-CM

## 2019-12-09 DIAGNOSIS — H25813 Combined forms of age-related cataract, bilateral: Secondary | ICD-10-CM

## 2019-12-09 DIAGNOSIS — H34831 Tributary (branch) retinal vein occlusion, right eye, with macular edema: Secondary | ICD-10-CM | POA: Diagnosis not present

## 2019-12-10 MED ORDER — BEVACIZUMAB CHEMO INJECTION 1.25MG/0.05ML SYRINGE FOR KALEIDOSCOPE
1.2500 mg | INTRAVITREAL | Status: AC | PRN
  Administered 2019-12-10: 1.25 mg via INTRAVITREAL

## 2020-01-02 NOTE — Progress Notes (Addendum)
[facility] - Clinic Note  01/07/2020     CHIEF COMPLAINT Patient presents for Retina Follow Up   HISTORY OF PRESENT ILLNESS: [name] [name] is a 74 y.o. male who presents to the clinic today for:   HPI    Retina Follow Up    Patient presents with  CRVO/BRVO.  In right eye.  Severity is mild.  Duration of 4 weeks.  Since onset it is stable.  I, the attending physician,  performed the HPI with the patient and updated documentation appropriately.          Comments    Pt states vision has been "fine" since last visit, no new fol or floaters, no eye pain, no eye drops, last week his PCP put him on Flomax       Last edited by Rennis ChrisZamora, Dontrez Pettis, MD on 01/07/2020  9:53 AM. (History)    pt states    Referring physician: Margot Ableskwubunka-Anyim, [place], MD [address] [place],  [place] [id number]  HISTORICAL INFORMATION:   Selected notes from the MEDICAL RECORD NUMBER Referred by Dr. SwazilandJordan DeMarco for concern of vein occlusion OD LEE:  Ocular Hx- PMH-   CURRENT MEDICATIONS: No current outpatient medications on file. (Ophthalmic Drugs)   No current facility-administered medications for this visit. (Ophthalmic Drugs)   Current Outpatient Medications (Other)  Medication Sig   tamsulosin (FLOMAX) 0.4 MG CAPS capsule Take by mouth.   acetaminophen (TYLENOL) 500 MG tablet Take 500-1,000 mg by mouth every 6 (six) hours as needed for moderate pain or headache.   Cyanocobalamin (B-12 PO) Take 1 capsule by mouth daily.   hydrOXYzine (ATARAX/VISTARIL) 25 MG tablet Take 25 mg by mouth 2 (two) times daily.   levothyroxine (SYNTHROID, LEVOTHROID) 100 MCG tablet TAKE 1 TABLET BY MOUTH ONCE DAILY   MELATONIN PO Take 1 tablet by mouth at bedtime.   Multiple Vitamin (MULTIVITAMIN WITH MINERALS) TABS tablet Take 1 tablet by mouth daily.   pravastatin (PRAVACHOL) 40 MG tablet TAKE 1 TABLET BY MOUTH EVERY DAY   sertraline (ZOLOFT) 100 MG tablet Take 1.5 tablets (150 mg  total) by mouth daily.   VITAMIN D PO Take 1 capsule by mouth daily.   No current facility-administered medications for this visit. (Other)      REVIEW OF SYSTEMS: ROS    Positive for: Endocrine, Eyes   Negative for: Constitutional, Gastrointestinal, Neurological, Skin, Genitourinary, Musculoskeletal, HENT, Cardiovascular, Respiratory, Psychiatric, Allergic/Imm, Heme/Lymph   Last edited by Posey BoyerBrown, Amanda J, [name] on 01/07/2020  9:23 AM. (History)       ALLERGIES Allergies  Allergen Reactions   Egg White (Diagnostic)     Showed up on allergy test   Penicillins     Did it involve swelling of the face/tongue/throat, SOB, or low BP? No Did it involve sudden or severe rash/hives, skin peeling, or any reaction on the inside of your mouth or nose? No Did you need to seek medical attention at a hospital or doctor's office? Unknown When did it last happen?Childhood allergy If all above answers are NO, may proceed with cephalosporin use.    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   History of skin cancer 04/29/2017   History of skin cancer 04/29/2017  Hypothyroidism 04/29/2017   Itching 04/29/2017   Scabies 05/28/2017   Past Surgical History:  Procedure Laterality Date   PPM GENERATOR CHANGEOUT N/A 06/14/2019   Procedure: PPM GENERATOR CHANGEOUT;  Surgeon: [name], MD;  Location: [facility] INVASIVE CV LAB;  Service: Cardiovascular;  Laterality: N/A;    FAMILY HISTORY Family History  Problem Relation Age of Onset   Hypertension Brother     SOCIAL HISTORY Social History   Tobacco Use   Smoking status: Never Smoker   Smokeless tobacco: Never Used  Substance Use Topics   Alcohol use: Yes   Drug use: No         OPHTHALMIC EXAM:  Base Eye Exam    Visual  Acuity (Snellen - Linear)      Right Left   Dist [place] 20/20 -2 20/20 -2   Dist ph [place]        Tonometry (Tonopen, 9:28 AM)      Right Left   Pressure 15 15       Pupils      Dark Light Shape React APD   Right 3 2 Round Brisk None   Left 3 2 Round Brisk None       Visual Fields (Counting fingers)      Left Right    Full Full       Extraocular Movement      Right Left    Full, Ortho Full, Ortho       Neuro/Psych    Oriented x3: Yes   Mood/Affect: Normal       Dilation    Both eyes: 1.0% Mydriacyl, 2.5% Phenylephrine @ 9:28 AM        Slit Lamp and Fundus Exam    Slit Lamp Exam      Right Left   Lids/Lashes Dermatochalasis - upper lid, Meibomian gland dysfunction, inflammed Hordeolum - lower lid - improving Dermatochalasis - upper lid, Meibomian gland dysfunction   Conjunctiva/Sclera White and quiet White and quiet   Cornea 2+ Punctate epithelial erosions 1+ Punctate epithelial erosions   Anterior Chamber Deep and quiet Deep and quiet   Iris Round and dilated Round and dilated   Lens 2-3+ Nuclear sclerosis, 2-3+ Cortical cataract 2-3+ Nuclear sclerosis, 2-3+ Cortical cataract   Vitreous Vitreous syneresis Vitreous syneresis       Fundus Exam      Right Left   Disc Pink and Sharp Pink and Sharp   C/D Ratio 0.4 0.5   Macula Blunted foveal reflex, focal IRH and edema IT to fovea -- slightly improved Flat, Good foveal reflex, Retinal pigment epithelial mottling, mild peripapillary pigmentation   Vessels Vascular attenuation, Tortuous greatest inferior temporal arcades, AV crossing changes Vascular attenuation, Tortuous greatest inferior temporal arcades, AV crossing changes   Periphery Attached, no heme Attached             IMAGING AND PROCEDURES  Imaging and Procedures for @TODAY @  OCT, Retina - OU - Both Eyes       Right Eye Quality was good. Central Foveal Thickness: 490. Progression has worsened. Findings include abnormal foveal contour, intraretinal  fluid, no SRF (Mild interval increase in IRF; Partial PVD).   Left Eye Quality was good. Central Foveal Thickness: 324. Progression has been stable. Findings include normal foveal contour, no IRF, no SRF (Partial PVD).   Notes *Images captured and stored on drive  Diagnosis / Impression:  OD: +IRF, no SRF, BRVO w/ interval increase in IRF OS: NFP, no IRF/SRF  Clinical management:  See below  Abbreviations: NFP - Normal foveal profile. CME - cystoid macular edema. PED - pigment epithelial detachment. IRF - intraretinal fluid. SRF - subretinal fluid. EZ - ellipsoid zone. ERM - epiretinal membrane. ORA - outer retinal atrophy. ORT - outer retinal tubulation. SRHM - subretinal hyper-reflective material        Intravitreal Injection, Pharmacologic Agent - OD - Right Eye       Time Out 01/07/2020. 9:56 AM. Confirmed correct patient, procedure, site, and patient consented.   Anesthesia Topical anesthesia was used. Anesthetic medications included Lidocaine 2%, Proparacaine 0.5%.   Procedure Preparation included 5% betadine to ocular surface, eyelid speculum. A supplied needle was used.   Injection:  1.25 mg Bevacizumab (AVASTIN) SOLN   NDC: 52778-242-35, Lot: [id number] , Expiration date: 02/16/2020   Route: Intravitreal, Site: Right Eye, Waste: 0 mg  Post-op Post injection exam found visual acuity of at least counting fingers. The patient tolerated the procedure well. There were no complications. The patient received written and verbal post procedure care education.                 ASSESSMENT/PLAN:    ICD-10-CM   1. Branch retinal vein occlusion of right eye with macular edema  H34.8310 Intravitreal Injection, Pharmacologic Agent - OD - Right Eye  2. Retinal edema  H35.81 OCT, Retina - OU - Both Eyes  3. Essential hypertension  I10   4. Hypertensive retinopathy of both eyes  H35.033   5. Combined forms of age-related cataract of both eyes  H25.813     1,2. BRVO with  CME OD             - S/P IVA OD #1 (05.14.21), #2 (06.14.21)  - BCVA 20/20 OD  - OCT shows interval inc in IRF -- ?IVA resistance  - FA 5.14.21 shows +focal telangectasias with late leakage inferior macula consistent with remote inf BRVO  - recommend IVA OD #3 today, 07.16.21, but discussed possible switch in medication if still no significant improvement in edema  - pt wishes to proceed  - RBA of procedure discussed, questions answered  - informed consent obtained  - see procedure note  - F/U 4 weeks -- DFE/OCT/possible injection, possible switch in medicaiton  3,4. Hypertensive retinopathy OU  - discussed importance of tight BP control  - monitor  5. Mixed form cataracts OU  - The symptoms of cataract, surgical options, and treatments and risks were discussed with patient.  - discussed diagnosis and progression  - not yet visually significant  - monitor for now   Ophthalmic Meds Ordered this visit:  No orders of the defined types were placed in this encounter.      Return in about 4 weeks (around 02/04/2020) for f/u BRVO OD -- Dilated Exam, OCT, Possible Injxn.  There are no Patient Instructions on file for this visit.   Explained the diagnoses, plan, and follow up with the patient and they expressed understanding.  Patient expressed understanding of the importance of proper follow up care.   This document serves as a record of services personally performed by 02/06/2020, MD, PhD. It was created on their behalf by Karie Chimera. Glee Arvin, [name], an ophthalmic technician. The creation of this record is the provider's dictation and/or activities during the visit.    Electronically signed by: Manson Passey. Glee Arvin, Manson Passey 07.11.2021 10:21 AM   09.11.2021, M.D., Ph.D. Diseases & Surgery of the Retina and Vitreous [facility]  I have reviewed the above documentation for accuracy and completeness, and I agree with the above. Karie Chimera, M.D., Ph.D. 01/07/20  10:21 AM   Abbreviations: M myopia (nearsighted); A astigmatism; H hyperopia (farsighted); P presbyopia; Mrx spectacle prescription;  CTL contact lenses; OD right eye; OS left eye; OU both eyes  XT exotropia; ET esotropia; PEK punctate epithelial keratitis; PEE punctate epithelial erosions; DES dry eye syndrome; MGD meibomian gland dysfunction; ATs artificial tears; PFAT's preservative free artificial tears; NSC nuclear sclerotic cataract; PSC posterior subcapsular cataract; ERM epi-retinal membrane; PVD posterior vitreous detachment; RD retinal detachment; DM diabetes mellitus; DR diabetic retinopathy; NPDR non-proliferative diabetic retinopathy; PDR proliferative diabetic retinopathy; CSME clinically significant macular edema; DME diabetic macular edema; dbh dot blot hemorrhages; CWS cotton wool spot; POAG primary open angle glaucoma; C/D cup-to-disc ratio; HVF humphrey visual field; GVF goldmann visual field; OCT optical coherence tomography; IOP intraocular pressure; BRVO Branch retinal vein occlusion; CRVO central retinal vein occlusion; CRAO central retinal artery occlusion; BRAO branch retinal artery occlusion; RT retinal tear; SB scleral buckle; PPV pars plana vitrectomy; VH Vitreous hemorrhage; PRP panretinal laser photocoagulation; IVK intravitreal kenalog; VMT vitreomacular traction; MH Macular hole;  NVD neovascularization of the disc; NVE neovascularization elsewhere; AREDS age related eye disease study; ARMD age related macular degeneration; POAG primary open angle glaucoma; EBMD epithelial/anterior basement membrane dystrophy; ACIOL anterior chamber intraocular lens; IOL intraocular lens; PCIOL posterior chamber intraocular lens; Phaco/IOL phacoemulsification with intraocular lens placement; PRK photorefractive keratectomy; LASIK laser assisted in situ keratomileusis; HTN hypertension; DM diabetes mellitus; COPD chronic obstructive pulmonary disease

## 2020-01-07 ENCOUNTER — Ambulatory Visit (INDEPENDENT_AMBULATORY_CARE_PROVIDER_SITE_OTHER): Payer: Medicare Other | Admitting: Ophthalmology

## 2020-01-07 ENCOUNTER — Other Ambulatory Visit: Payer: Self-pay

## 2020-01-07 ENCOUNTER — Encounter (INDEPENDENT_AMBULATORY_CARE_PROVIDER_SITE_OTHER): Payer: Self-pay | Admitting: Ophthalmology

## 2020-01-07 DIAGNOSIS — H3581 Retinal edema: Secondary | ICD-10-CM

## 2020-01-07 DIAGNOSIS — H34831 Tributary (branch) retinal vein occlusion, right eye, with macular edema: Secondary | ICD-10-CM

## 2020-01-07 DIAGNOSIS — I10 Essential (primary) hypertension: Secondary | ICD-10-CM

## 2020-01-07 DIAGNOSIS — H35033 Hypertensive retinopathy, bilateral: Secondary | ICD-10-CM | POA: Diagnosis not present

## 2020-01-07 DIAGNOSIS — H25813 Combined forms of age-related cataract, bilateral: Secondary | ICD-10-CM

## 2020-01-07 MED ORDER — BEVACIZUMAB CHEMO INJECTION 1.25MG/0.05ML SYRINGE FOR KALEIDOSCOPE
1.2500 mg | INTRAVITREAL | Status: AC | PRN
  Administered 2020-01-07: 1.25 mg via INTRAVITREAL

## 2020-02-02 ENCOUNTER — Ambulatory Visit (INDEPENDENT_AMBULATORY_CARE_PROVIDER_SITE_OTHER): Payer: Medicare Other | Admitting: *Deleted

## 2020-02-02 DIAGNOSIS — I442 Atrioventricular block, complete: Secondary | ICD-10-CM | POA: Diagnosis not present

## 2020-02-02 LAB — CUP PACEART REMOTE DEVICE CHECK
Battery Remaining Longevity: 116 mo
Battery Remaining Percentage: 95.5 %
Battery Voltage: 3.02 V
Brady Statistic AP VP Percent: 43 %
Brady Statistic AP VS Percent: 1 %
Brady Statistic AS VP Percent: 57 %
Brady Statistic AS VS Percent: 1 %
Brady Statistic RA Percent Paced: 43 %
Brady Statistic RV Percent Paced: 99 %
Date Time Interrogation Session: 20210810202717
Implantable Lead Implant Date: 20101229
Implantable Lead Implant Date: 20101229
Implantable Lead Location: 753859
Implantable Lead Location: 753860
Implantable Pulse Generator Implant Date: 20201221
Lead Channel Impedance Value: 410 Ohm
Lead Channel Impedance Value: 490 Ohm
Lead Channel Pacing Threshold Amplitude: 0.625 V
Lead Channel Pacing Threshold Amplitude: 0.75 V
Lead Channel Pacing Threshold Pulse Width: 0.5 ms
Lead Channel Pacing Threshold Pulse Width: 0.5 ms
Lead Channel Sensing Intrinsic Amplitude: 11.4 mV
Lead Channel Sensing Intrinsic Amplitude: 4.7 mV
Lead Channel Setting Pacing Amplitude: 0.875
Lead Channel Setting Pacing Amplitude: 1.75 V
Lead Channel Setting Pacing Pulse Width: 0.5 ms
Lead Channel Setting Sensing Sensitivity: 4 mV
Pulse Gen Model: 2272
Pulse Gen Serial Number: 9190527

## 2020-02-07 NOTE — Progress Notes (Signed)
Remote pacemaker transmission.   

## 2020-02-07 NOTE — Progress Notes (Signed)
[facility] - Clinic Note  02/09/2020     CHIEF COMPLAINT Patient presents for Retina Follow Up   HISTORY OF PRESENT ILLNESS: [name] [name] is a 74 y.o. male who presents to the clinic today for:   HPI    Retina Follow Up    Patient presents with  CRVO/BRVO.  In right eye.  Duration of 4 weeks.  Since onset it is stable.  I, the attending physician,  performed the HPI with the patient and updated documentation appropriately.          Comments    4 week follow up BRVO OD- Doing well.  Denies any new problems.  Vision appears stable.  Denies using eye drops.        Last edited by Rennis ChrisZamora, Marcine Gadway, MD on 02/09/2020  8:07 AM. (History)    pt states vision is the same as last visit   Referring physician: Demarco, [place], OD [address] [place] [id number]  HISTORICAL INFORMATION:   Selected notes from the MEDICAL RECORD NUMBER Referred by Dr. [place] DeMarco for concern of vein occlusion OD LEE:  Ocular Hx- PMH-   CURRENT MEDICATIONS: No current outpatient medications on file. (Ophthalmic Drugs)   No current facility-administered medications for this visit. (Ophthalmic Drugs)   Current Outpatient Medications (Other)  Medication Sig  . acetaminophen (TYLENOL) 500 MG tablet Take 500-1,000 mg by mouth every 6 (six) hours as needed for moderate pain or headache.  . Cyanocobalamin (B-12 PO) Take 1 capsule by mouth daily.  . hydrOXYzine (ATARAX/VISTARIL) 25 MG tablet Take 25 mg by mouth 2 (two) times daily.  Marland Kitchen. levothyroxine (SYNTHROID, LEVOTHROID) 100 MCG tablet TAKE 1 TABLET BY MOUTH ONCE DAILY  . MELATONIN PO Take 1 tablet by mouth at bedtime.  . Multiple Vitamin (MULTIVITAMIN WITH MINERALS) TABS tablet Take 1 tablet by mouth daily.  . pravastatin (PRAVACHOL) 40 MG tablet TAKE 1 TABLET BY MOUTH EVERY DAY  . sertraline (ZOLOFT) 100 MG tablet Take 1.5 tablets (150 mg total) by mouth daily.  . tamsulosin (FLOMAX) 0.4 MG CAPS capsule Take by  mouth.  Marland Kitchen. VITAMIN D PO Take 1 capsule by mouth daily.   No current facility-administered medications for this visit. (Other)      REVIEW OF SYSTEMS: ROS    Positive for: Endocrine, Cardiovascular, Eyes, Heme/Lymph   Negative for: Constitutional, Gastrointestinal, Neurological, Skin, Genitourinary, Musculoskeletal, HENT, Respiratory, Psychiatric, Allergic/Imm   Last edited by Joni ReiningHodges, Robin, COA on 02/09/2020  7:46 AM. (History)       ALLERGIES Allergies  Allergen Reactions  . Egg White (Diagnostic)     Showed up on allergy test  . Penicillins     Did it involve swelling of the face/tongue/throat, SOB, or low BP? No Did it involve sudden or severe rash/hives, skin peeling, or any reaction on the inside of your mouth or nose? No Did you need to seek medical attention at a hospital or doctor's office? Unknown When did it last happen?Childhood allergy If all above answers are "NO", may proceed with cephalosporin use.   . Shrimp [Shellfish Allergy]     Showed up on allergy test  . Wheat Bran     Showed up on allergy test  . Sulfa Antibiotics Rash    PAST MEDICAL HISTORY Past Medical History:  Diagnosis Date  . 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 History of skin cancer 04/29/2017  . History of skin cancer 04/29/2017  . Hypothyroidism 04/29/2017  .  Itching 04/29/2017  . Scabies 05/28/2017   Past Surgical History:  Procedure Laterality Date  . PPM GENERATOR CHANGEOUT N/A 06/14/2019   Procedure: PPM GENERATOR CHANGEOUT;  Surgeon: [name], MD;  Location: [facility] INVASIVE CV LAB;  Service: Cardiovascular;  Laterality: N/A;    FAMILY HISTORY Family History  Problem Relation Age of Onset  . Hypertension Brother     SOCIAL HISTORY Social History   Tobacco Use  . Smoking status: Never Smoker  . Smokeless tobacco: Never Used  Substance Use Topics  . Alcohol use: Yes  . Drug use: No         OPHTHALMIC EXAM:  Base Eye Exam    Visual Acuity (Snellen -  Linear)      Right Left   Dist [place] 20/20 -2 20/20 -2       Tonometry (Tonopen, 7:52 AM)      Right Left   Pressure 18 16       Pupils      Dark Light Shape React APD   Right 3 2 Round Brisk None   Left 3 2 Round Brisk None       Visual Fields (Counting fingers)      Left Right    Full Full       Extraocular Movement      Right Left    Full Full       Neuro/Psych    Oriented x3: Yes   Mood/Affect: Normal       Dilation    Both eyes: 1.0% Mydriacyl, 2.5% Phenylephrine @ 7:52 AM        Slit Lamp and Fundus Exam    Slit Lamp Exam      Right Left   Lids/Lashes Dermatochalasis - upper lid, Meibomian gland dysfunction, inflammed Hordeolum - lower lid - improving Dermatochalasis - upper lid, Meibomian gland dysfunction   Conjunctiva/Sclera White and quiet White and quiet   Cornea 2+ Punctate epithelial erosions 1+ Punctate epithelial erosions   Anterior Chamber Deep and quiet Deep and quiet   Iris Round and dilated Round and dilated   Lens 2-3+ Nuclear sclerosis, 2-3+ Cortical cataract 2-3+ Nuclear sclerosis, 2-3+ Cortical cataract   Vitreous Vitreous syneresis Vitreous syneresis       Fundus Exam      Right Left   Disc Pink and Sharp Pink and Sharp   C/D Ratio 0.4 0.5   Macula Blunted foveal reflex, focal IRH and edema IT to fovea -- slightly increased Flat, Good foveal reflex, Retinal pigment epithelial mottling, mild peripapillary pigmentation   Vessels Vascular attenuation, Tortuous greatest inferior temporal arcades, AV crossing changes Vascular attenuation, Tortuous greatest inferior temporal arcades, AV crossing changes   Periphery Attached, no heme Attached             IMAGING AND PROCEDURES  Imaging and Procedures for @TODAY @  OCT, Retina - OU - Both Eyes       Right Eye Quality was good. Central Foveal Thickness: 515. Progression has worsened. Findings include abnormal foveal contour, intraretinal fluid, no SRF, intraretinal hyper-reflective  material (Mild interval increase in IRF/IRHM; Partial PVD).   Left Eye Quality was good. Central Foveal Thickness: 323. Progression has been stable. Findings include normal foveal contour, no IRF, no SRF (Partial PVD).   Notes *Images captured and stored on drive  Diagnosis / Impression:  OD: +IRF, no SRF, BRVO w/ interval increase in IRF OS: NFP, no IRF/SRF  Clinical management:  See below  Abbreviations: NFP -  Normal foveal profile. CME - cystoid macular edema. PED - pigment epithelial detachment. IRF - intraretinal fluid. SRF - subretinal fluid. EZ - ellipsoid zone. ERM - epiretinal membrane. ORA - outer retinal atrophy. ORT - outer retinal tubulation. SRHM - subretinal hyper-reflective material        Intravitreal Injection, Pharmacologic Agent - OD - Right Eye       Time Out 02/09/2020. 8:17 AM. Confirmed correct patient, procedure, site, and patient consented.   Anesthesia Topical anesthesia was used. Anesthetic medications included Lidocaine 2%, Proparacaine 0.5%.   Procedure Preparation included 5% betadine to ocular surface, eyelid speculum. A (32g) needle was used.   Injection:  2 mg aflibercept Gretta Cool) SOLN   NDC: 50354-656-81, Lot: [id number], Expiration date: 03/24/2020   Route: Intravitreal, Site: Right Eye, Waste: 0.05 mL  Post-op Post injection exam found visual acuity of at least counting fingers. The patient tolerated the procedure well. There were no complications. The patient received written and verbal post procedure care education.   Notes **SAMPLE MEDICATION ADMINISTERED**                ASSESSMENT/PLAN:    ICD-10-CM   1. Branch retinal vein occlusion of right eye with macular edema  H34.8310 Intravitreal Injection, Pharmacologic Agent - OD - Right Eye    aflibercept (EYLEA) SOLN 2 mg  2. Retinal edema  H35.81 OCT, Retina - OU - Both Eyes  3. Essential hypertension  I10   4. Hypertensive retinopathy of both eyes  H35.033   5. Combined  forms of age-related cataract of both eyes  H25.813     1,2. BRVO with CME OD             - S/P IVA OD #1 (05.14.21), #2 (06.14.21), #3 (07.16.21)  - BCVA 20/20 OD  - FA 5.14.21 shows +focal telangectasias with late leakage inferior macula consistent with remote inf BRVO  - OCT shows interval inc in IRF -- ?IVA resistance  - discussed switch in medication due to IVA resistance  - recommend IVE OD #1 today, 08.18.21 -- sample  - pt wishes to proceed  - RBA of procedure discussed, questions answered  - informed consent obtained  - Eylea informed consent form signed and scanned on 08.18.2021  - see procedure note  - [employer] benefits investigation started, 08.18.21  - F/U 4 weeks -- DFE/OCT/possible injection  3,4. Hypertensive retinopathy OU  - discussed importance of tight BP control  - monitor   5. Mixed form cataracts OU  - The symptoms of cataract, surgical options, and treatments and risks were discussed with patient.  - discussed diagnosis and progression  - not yet visually significant  - monitor for now   Ophthalmic Meds Ordered this visit:  Meds ordered this encounter  Medications  . aflibercept (EYLEA) SOLN 2 mg       Return in about 4 weeks (around 03/08/2020) for BRVO OD - Dilated Exam, OCT, Possible Injxn.  There are no Patient Instructions on file for this visit.   Explained the diagnoses, plan, and follow up with the patient and they expressed understanding.  Patient expressed understanding of the importance of proper follow up care.   This document serves as a record of services personally performed by Karie Chimera, MD, PhD. It was created on their behalf by Annalee Genta, COMT. The creation of this record is the provider's dictation and/or activities during the visit.  Electronically signed by: Annalee Genta, COMT 02/09/20 12:29 PM  This document serves  as a record of services personally performed by Karie Chimera, MD, PhD. It was created on their  behalf by Glee Arvin. Manson Passey, OA an ophthalmic technician. The creation of this record is the provider's dictation and/or activities during the visit.    Electronically signed by: Glee Arvin. Kristopher Oppenheim 08.18.2021 12:29 PM  Karie Chimera, M.D., Ph.D. Diseases & Surgery of the Retina and Vitreous [facility]  I have reviewed the above documentation for accuracy and completeness, and I agree with the above. Karie Chimera, M.D., Ph.D. 02/09/20 12:29 PM    Abbreviations: M myopia (nearsighted); A astigmatism; H hyperopia (farsighted); P presbyopia; Mrx spectacle prescription;  CTL contact lenses; OD right eye; OS left eye; OU both eyes  XT exotropia; ET esotropia; PEK punctate epithelial keratitis; PEE punctate epithelial erosions; DES dry eye syndrome; MGD meibomian gland dysfunction; ATs artificial tears; PFAT's preservative free artificial tears; NSC nuclear sclerotic cataract; PSC posterior subcapsular cataract; ERM epi-retinal membrane; PVD posterior vitreous detachment; RD retinal detachment; DM diabetes mellitus; DR diabetic retinopathy; NPDR non-proliferative diabetic retinopathy; PDR proliferative diabetic retinopathy; CSME clinically significant macular edema; DME diabetic macular edema; dbh dot blot hemorrhages; CWS cotton wool spot; POAG primary open angle glaucoma; C/D cup-to-disc ratio; HVF humphrey visual field; GVF goldmann visual field; OCT optical coherence tomography; IOP intraocular pressure; BRVO Branch retinal vein occlusion; CRVO central retinal vein occlusion; CRAO central retinal artery occlusion; BRAO branch retinal artery occlusion; RT retinal tear; SB scleral buckle; PPV pars plana vitrectomy; VH Vitreous hemorrhage; PRP panretinal laser photocoagulation; IVK intravitreal kenalog; VMT vitreomacular traction; MH Macular hole;  NVD neovascularization of the disc; NVE neovascularization elsewhere; AREDS age related eye disease study; ARMD age related macular  degeneration; POAG primary open angle glaucoma; EBMD epithelial/anterior basement membrane dystrophy; ACIOL anterior chamber intraocular lens; IOL intraocular lens; PCIOL posterior chamber intraocular lens; Phaco/IOL phacoemulsification with intraocular lens placement; PRK photorefractive keratectomy; LASIK laser assisted in situ keratomileusis; HTN hypertension; DM diabetes mellitus; COPD chronic obstructive pulmonary disease

## 2020-02-09 ENCOUNTER — Encounter (INDEPENDENT_AMBULATORY_CARE_PROVIDER_SITE_OTHER): Payer: Self-pay | Admitting: Ophthalmology

## 2020-02-09 ENCOUNTER — Ambulatory Visit (INDEPENDENT_AMBULATORY_CARE_PROVIDER_SITE_OTHER): Payer: Medicare Other | Admitting: Ophthalmology

## 2020-02-09 ENCOUNTER — Other Ambulatory Visit: Payer: Self-pay

## 2020-02-09 DIAGNOSIS — I10 Essential (primary) hypertension: Secondary | ICD-10-CM | POA: Diagnosis not present

## 2020-02-09 DIAGNOSIS — H35033 Hypertensive retinopathy, bilateral: Secondary | ICD-10-CM | POA: Diagnosis not present

## 2020-02-09 DIAGNOSIS — H3581 Retinal edema: Secondary | ICD-10-CM

## 2020-02-09 DIAGNOSIS — H34831 Tributary (branch) retinal vein occlusion, right eye, with macular edema: Secondary | ICD-10-CM

## 2020-02-09 DIAGNOSIS — H25813 Combined forms of age-related cataract, bilateral: Secondary | ICD-10-CM

## 2020-02-09 MED ORDER — AFLIBERCEPT 2MG/0.05ML IZ SOLN FOR KALEIDOSCOPE
2.0000 mg | INTRAVITREAL | Status: AC | PRN
  Administered 2020-02-09: 2 mg via INTRAVITREAL

## 2020-03-06 NOTE — Progress Notes (Signed)
[facility] - Clinic Note  03/08/2020     CHIEF COMPLAINT Patient presents for Retina Follow Up   HISTORY OF PRESENT ILLNESS: [name] [name] is a 74 y.o. male who presents to the clinic today for:   HPI    Retina Follow Up    Patient presents with  CRVO/BRVO.  In right eye.  This started weeks ago.  Severity is moderate.  Duration of weeks.  Since onset it is stable.  I, the attending physician,  performed the HPI with the patient and updated documentation appropriately.          Comments    Pt states vision is the same OU.  Pt denies eye pain or discomfort and denies any new or worsening floaters or fol OU.       Last edited by Rennis ChrisZamora, Amberlynn Tempesta, MD on 03/08/2020  8:29 AM. (History)    pt states he is doing "okay", no change in vision   Referring physician: Demarco, [place], OD [address] [place] [id number]  HISTORICAL INFORMATION:   Selected notes from the MEDICAL RECORD NUMBER Referred by Dr. SwazilandJordan DeMarco for concern of vein occlusion OD LEE:  Ocular Hx- PMH-   CURRENT MEDICATIONS: No current outpatient medications on file. (Ophthalmic Drugs)   No current facility-administered medications for this visit. (Ophthalmic Drugs)   Current Outpatient Medications (Other)  Medication Sig  . acetaminophen (TYLENOL) 500 MG tablet Take 500-1,000 mg by mouth every 6 (six) hours as needed for moderate pain or headache.  . Cyanocobalamin (B-12 PO) Take 1 capsule by mouth daily.  . hydrOXYzine (ATARAX/VISTARIL) 25 MG tablet Take 25 mg by mouth 2 (two) times daily.  Marland Kitchen. levothyroxine (SYNTHROID, LEVOTHROID) 100 MCG tablet TAKE 1 TABLET BY MOUTH ONCE DAILY  . MELATONIN PO Take 1 tablet by mouth at bedtime.  . Multiple Vitamin (MULTIVITAMIN WITH MINERALS) TABS tablet Take 1 tablet by mouth daily.  . pravastatin (PRAVACHOL) 40 MG tablet TAKE 1 TABLET BY MOUTH EVERY DAY  . sertraline (ZOLOFT) 100 MG tablet Take 1.5 tablets (150 mg total) by mouth  daily.  . tamsulosin (FLOMAX) 0.4 MG CAPS capsule Take by mouth.  Marland Kitchen. VITAMIN D PO Take 1 capsule by mouth daily.   No current facility-administered medications for this visit. (Other)      REVIEW OF SYSTEMS: ROS    Positive for: Endocrine, Cardiovascular, Eyes, Heme/Lymph   Negative for: Constitutional, Gastrointestinal, Neurological, Skin, Genitourinary, Musculoskeletal, HENT, Respiratory, Psychiatric, Allergic/Imm   Last edited by Corrinne EagleEnglish, Ashley L on 03/08/2020  7:50 AM. (History)       ALLERGIES Allergies  Allergen Reactions  . Egg White (Diagnostic)     Showed up on allergy test  . Penicillins     Did it involve swelling of the face/tongue/throat, SOB, or low BP? No Did it involve sudden or severe rash/hives, skin peeling, or any reaction on the inside of your mouth or nose? No Did you need to seek medical attention at a hospital or doctor's office? Unknown When did it last happen?Childhood allergy If all above answers are "NO", may proceed with cephalosporin use.   . Shrimp [Shellfish Allergy]     Showed up on allergy test  . Wheat Bran     Showed up on allergy test  . Sulfa Antibiotics Rash    PAST MEDICAL HISTORY Past Medical History:  Diagnosis Date  . 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 History of skin cancer 04/29/2017  . History  of skin cancer 04/29/2017  . Hypothyroidism 04/29/2017  . Itching 04/29/2017  . Scabies 05/28/2017   Past Surgical History:  Procedure Laterality Date  . PPM GENERATOR CHANGEOUT N/A 06/14/2019   Procedure: PPM GENERATOR CHANGEOUT;  Surgeon: [name], MD;  Location: [facility] INVASIVE CV LAB;  Service: Cardiovascular;  Laterality: N/A;    FAMILY HISTORY Family History  Problem Relation Age of Onset  . Hypertension Brother     SOCIAL HISTORY Social History   Tobacco Use  . Smoking status: Never Smoker  . Smokeless tobacco: Never Used  Substance Use Topics  . Alcohol use: Yes  . Drug use: No          OPHTHALMIC EXAM:  Base Eye Exam    Visual Acuity (Snellen - Linear)      Right Left   Dist [place] 20/20 -2 20/30 -2   Dist ph [place]  20/25       Tonometry (Tonopen, 7:55 AM)      Right Left   Pressure 20 20       Pupils      Dark Light Shape React APD   Right 3 2 Round Brisk 0   Left 3 2 Round Brisk 0       Visual Fields      Left Right    Full Full       Extraocular Movement      Right Left    Full Full       Neuro/Psych    Oriented x3: Yes   Mood/Affect: Normal       Dilation    Both eyes: 1.0% Mydriacyl, 2.5% Phenylephrine @ 7:55 AM        Slit Lamp and Fundus Exam    Slit Lamp Exam      Right Left   Lids/Lashes Dermatochalasis - upper lid, Meibomian gland dysfunction, inflammed Hordeolum - lower lid - improving Dermatochalasis - upper lid, Meibomian gland dysfunction   Conjunctiva/Sclera White and quiet White and quiet   Cornea 2+ Punctate epithelial erosions 1+ Punctate epithelial erosions   Anterior Chamber Deep and quiet Deep and quiet   Iris Round and dilated Round and dilated   Lens 2-3+ Nuclear sclerosis, 2-3+ Cortical cataract 2-3+ Nuclear sclerosis, 2-3+ Cortical cataract   Vitreous Vitreous syneresis Vitreous syneresis       Fundus Exam      Right Left   Disc Pink and Sharp Pink and Sharp   C/D Ratio 0.4 0.5   Macula Blunted foveal reflex, focal IRH and edema IT to fovea -- slightly improved Flat, Good foveal reflex, Retinal pigment epithelial mottling, mild peripapillary pigmentation   Vessels Vascular attenuation, Tortuous greatest inferior temporal arcades, AV crossing changes Vascular attenuation, Tortuous greatest inferior temporal arcades, AV crossing changes   Periphery Attached, no heme Attached             IMAGING AND PROCEDURES  Imaging and Procedures for @TODAY @  OCT, Retina - OU - Both Eyes       Right Eye Quality was good. Central Foveal Thickness: 426. Progression has improved. Findings include abnormal foveal contour,  intraretinal fluid, no SRF, intraretinal hyper-reflective material (interval improvement in IRF/IRHM; Partial PVD).   Left Eye Quality was good. Central Foveal Thickness: 322. Progression has been stable. Findings include normal foveal contour, no IRF, no SRF (Partial PVD).   Notes *Images captured and stored on drive  Diagnosis / Impression:  OD: +IRF, no SRF, BRVO w/ interval improvement in IRF  OS: NFP, no IRF/SRF  Clinical management:  See below  Abbreviations: NFP - Normal foveal profile. CME - cystoid macular edema. PED - pigment epithelial detachment. IRF - intraretinal fluid. SRF - subretinal fluid. EZ - ellipsoid zone. ERM - epiretinal membrane. ORA - outer retinal atrophy. ORT - outer retinal tubulation. SRHM - subretinal hyper-reflective material        Intravitreal Injection, Pharmacologic Agent - OD - Right Eye       Time Out 03/08/2020. 8:02 AM. Confirmed correct patient, procedure, site, and patient consented.   Anesthesia Topical anesthesia was used. Anesthetic medications included Lidocaine 2%, Proparacaine 0.5%.   Procedure Preparation included 5% betadine to ocular surface, eyelid speculum. A (32g) needle was used.   Injection:  2 mg aflibercept Gretta Cool) SOLN   NDC: L6038910, Lot: [id number], Expiration date: 06/23/2020   Route: Intravitreal, Site: Right Eye, Waste: 0.05 mL  Post-op Post injection exam found visual acuity of at least counting fingers. The patient tolerated the procedure well. There were no complications. The patient received written and verbal post procedure care education.                 ASSESSMENT/PLAN:    ICD-10-CM   1. Branch retinal vein occlusion of right eye with macular edema  H34.8310 Intravitreal Injection, Pharmacologic Agent - OD - Right Eye    aflibercept (EYLEA) SOLN 2 mg  2. Retinal edema  H35.81 OCT, Retina - OU - Both Eyes  3. Essential hypertension  I10   4. Hypertensive retinopathy of both eyes  H35.033    5. Combined forms of age-related cataract of both eyes  H25.813     1,2. BRVO with CME OD  - FA 5.14.21 shows +focal telangectasias with late leakage inferior macula consistent with remote inf BRVO  - S/P IVA OD #1 (05.14.21), #2 (06.14.21), #3 (07.16.21) -- IVA resistance  - s/p IVE OD #1 (08.18.21) -- sample  - BCVA 20/20 OD (stable)  - OCT shows interval improvement in IRF -- good response to IVE  - recommend IVE OD #2 today, 09.15.2021   - pt wishes to proceed  - RBA of procedure discussed, questions answered  - informed consent obtained  - Eylea informed consent form signed and scanned on 08.18.2021  - see procedure note  - [employer] benefits investigation started, 08.18.21 -- approved as of 09.15.21 -- secondary ins pays  - F/U 4 weeks -- DFE/OCT/possible injection  3,4. Hypertensive retinopathy OU  - discussed importance of tight BP control  - monitor   5. Mixed form cataracts OU  - The symptoms of cataract, surgical options, and treatments and risks were discussed with patient.  - discussed diagnosis and progression  - not yet visually significant  - monitor for now   Ophthalmic Meds Ordered this visit:  Meds ordered this encounter  Medications  . aflibercept (EYLEA) SOLN 2 mg       Return in about 4 weeks (around 04/05/2020) for f/u BRVO OD, DFE, OCT.  There are no Patient Instructions on file for this visit.   Explained the diagnoses, plan, and follow up with the patient and they expressed understanding.  Patient expressed understanding of the importance of proper follow up care.   This document serves as a record of services personally performed by Karie Chimera, MD, PhD. It was created on their behalf by Annalee Genta, COMT. The creation of this record is the provider's dictation and/or activities during the visit.  Electronically signed by: Annalee Genta,  COMT 03/08/20 8:32 AM   This document serves as a record of services personally performed by Karie Chimera, MD, PhD. It was created on their behalf by Glee Arvin. Manson Passey, OA an ophthalmic technician. The creation of this record is the provider's dictation and/or activities during the visit.    Electronically signed by: Glee Arvin. Manson Passey, [place] 09.15.2021 8:32 AM  Karie Chimera, M.D., Ph.D. Diseases & Surgery of the Retina and Vitreous [facility]  I have reviewed the above documentation for accuracy and completeness, and I agree with the above. Karie Chimera, M.D., Ph.D. 03/08/20 8:32 AM   Abbreviations: M myopia (nearsighted); A astigmatism; H hyperopia (farsighted); P presbyopia; Mrx spectacle prescription;  CTL contact lenses; OD right eye; OS left eye; OU both eyes  XT exotropia; ET esotropia; PEK punctate epithelial keratitis; PEE punctate epithelial erosions; DES dry eye syndrome; MGD meibomian gland dysfunction; ATs artificial tears; PFAT's preservative free artificial tears; NSC nuclear sclerotic cataract; PSC posterior subcapsular cataract; ERM epi-retinal membrane; PVD posterior vitreous detachment; RD retinal detachment; DM diabetes mellitus; DR diabetic retinopathy; NPDR non-proliferative diabetic retinopathy; PDR proliferative diabetic retinopathy; CSME clinically significant macular edema; DME diabetic macular edema; dbh dot blot hemorrhages; CWS cotton wool spot; POAG primary open angle glaucoma; C/D cup-to-disc ratio; HVF humphrey visual field; GVF goldmann visual field; OCT optical coherence tomography; IOP intraocular pressure; BRVO Branch retinal vein occlusion; CRVO central retinal vein occlusion; CRAO central retinal artery occlusion; BRAO branch retinal artery occlusion; RT retinal tear; SB scleral buckle; PPV pars plana vitrectomy; VH Vitreous hemorrhage; PRP panretinal laser photocoagulation; IVK intravitreal kenalog; VMT vitreomacular traction; MH Macular hole;  NVD neovascularization of the disc; NVE neovascularization elsewhere; AREDS age related eye  disease study; ARMD age related macular degeneration; POAG primary open angle glaucoma; EBMD epithelial/anterior basement membrane dystrophy; ACIOL anterior chamber intraocular lens; IOL intraocular lens; PCIOL posterior chamber intraocular lens; Phaco/IOL phacoemulsification with intraocular lens placement; PRK photorefractive keratectomy; LASIK laser assisted in situ keratomileusis; HTN hypertension; DM diabetes mellitus; COPD chronic obstructive pulmonary disease

## 2020-03-08 ENCOUNTER — Encounter (INDEPENDENT_AMBULATORY_CARE_PROVIDER_SITE_OTHER): Payer: Self-pay | Admitting: Ophthalmology

## 2020-03-08 ENCOUNTER — Other Ambulatory Visit: Payer: Self-pay

## 2020-03-08 ENCOUNTER — Ambulatory Visit (INDEPENDENT_AMBULATORY_CARE_PROVIDER_SITE_OTHER): Payer: Medicare Other | Admitting: Ophthalmology

## 2020-03-08 DIAGNOSIS — H3581 Retinal edema: Secondary | ICD-10-CM

## 2020-03-08 DIAGNOSIS — I10 Essential (primary) hypertension: Secondary | ICD-10-CM

## 2020-03-08 DIAGNOSIS — H25813 Combined forms of age-related cataract, bilateral: Secondary | ICD-10-CM

## 2020-03-08 DIAGNOSIS — H35033 Hypertensive retinopathy, bilateral: Secondary | ICD-10-CM | POA: Diagnosis not present

## 2020-03-08 DIAGNOSIS — H34831 Tributary (branch) retinal vein occlusion, right eye, with macular edema: Secondary | ICD-10-CM | POA: Diagnosis not present

## 2020-03-08 MED ORDER — AFLIBERCEPT 2MG/0.05ML IZ SOLN FOR KALEIDOSCOPE
2.0000 mg | INTRAVITREAL | Status: AC | PRN
  Administered 2020-03-08: 2 mg via INTRAVITREAL

## 2020-04-06 NOTE — Progress Notes (Signed)
[facility] - Clinic Note  04/10/2020     CHIEF COMPLAINT Patient presents for Retina Follow Up   HISTORY OF PRESENT ILLNESS: [name] [name] is a 74 y.o. male who presents to the clinic today for:   HPI    Retina Follow Up    Patient presents with  CRVO/BRVO.  In right eye.  Severity is moderate.  Duration of 4 weeks.  Since onset it is stable.  I, the attending physician,  performed the HPI with the patient and updated documentation appropriately.          Comments    Patient states vision the same OU.       Last edited by [name], MD on 04/10/2020  8:34 AM. (History)    pt states   Referring physician: Margot Ables, MD [address] [place],  [place] [address]  HISTORICAL INFORMATION:   Selected notes from the MEDICAL RECORD NUMBER Referred by [name] for concern of vein occlusion OD LEE:  Ocular Hx- PMH-   CURRENT MEDICATIONS: No current outpatient medications on file. (Ophthalmic Drugs)   No current facility-administered medications for this visit. (Ophthalmic Drugs)   Current Outpatient Medications (Other)  Medication Sig   acetaminophen (TYLENOL) 500 MG tablet Take 500-1,000 mg by mouth every 6 (six) hours as needed for moderate pain or headache.   Cyanocobalamin (B-12 PO) Take 1 capsule by mouth daily.   hydrOXYzine (ATARAX/VISTARIL) 25 MG tablet Take 25 mg by mouth 2 (two) times daily.   levothyroxine (SYNTHROID, LEVOTHROID) 100 MCG tablet TAKE 1 TABLET BY MOUTH ONCE DAILY   MELATONIN PO Take 1 tablet by mouth at bedtime.   Multiple Vitamin (MULTIVITAMIN WITH MINERALS) TABS tablet Take 1 tablet by mouth daily.   pravastatin (PRAVACHOL) 40 MG tablet TAKE 1 TABLET BY MOUTH EVERY DAY   sertraline (ZOLOFT) 100 MG tablet Take 1.5 tablets (150 mg total) by mouth daily.   tamsulosin (FLOMAX) 0.4 MG CAPS capsule Take by mouth.   VITAMIN D PO Take 1 capsule by mouth daily.   No current  facility-administered medications for this visit. (Other)      REVIEW OF SYSTEMS: ROS    Positive for: Endocrine, Cardiovascular, Eyes, Heme/Lymph   Negative for: Constitutional, Gastrointestinal, Neurological, Skin, Genitourinary, Musculoskeletal, HENT, Respiratory, Psychiatric, Allergic/Imm   Last edited by Annalee Genta D, [name] on 04/10/2020  8:21 AM. (History)       ALLERGIES Allergies  Allergen Reactions   Egg White (Diagnostic)     Showed up on allergy test   Penicillins     Did it involve swelling of the face/tongue/throat, SOB, or low BP? No Did it involve sudden or severe rash/hives, skin peeling, or any reaction on the inside of your mouth or nose? No Did you need to seek medical attention at a hospital or doctor's office? Unknown When did it last happen?Childhood allergy If all above answers are NO, may proceed with cephalosporin use.    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   History of skin cancer 04/29/2017   History of skin cancer 04/29/2017   Hypothyroidism 04/29/2017   Itching 04/29/2017   Scabies 05/28/2017   Past Surgical History:  Procedure Laterality Date   PPM GENERATOR  CHANGEOUT N/A 06/14/2019   Procedure: PPM GENERATOR CHANGEOUT;  Surgeon: Marinus Mawaylor, Gregg W, MD;  [facility];  Service: Cardiovascular;  Laterality: N/A;    FAMILY HISTORY Family History  Problem Relation Age of Onset   Hypertension Brother     SOCIAL HISTORY Social History   Tobacco Use   Smoking status: Never Smoker   Smokeless tobacco: Never Used  Substance Use Topics   Alcohol use: Yes   Drug use: No         OPHTHALMIC EXAM:  Base Eye Exam    Visual Acuity (Snellen - Linear)      Right Left   Dist [place] 20/25 20/30 +1   Dist ph [place] 20/20  -1 20/25 +2       Tonometry (Tonopen, 8:26 AM)      Right Left   Pressure 15 15       Pupils      Dark Light Shape React APD   Right 3 2 Round Brisk None   Left 3 2 Round Brisk None       Visual Fields (Counting fingers)      Left Right    Full Full       Extraocular Movement      Right Left    Full, Ortho Full, Ortho       Neuro/Psych    Oriented x3: Yes   Mood/Affect: Normal       Dilation    Both eyes: 1.0% Mydriacyl, 2.5% Phenylephrine @ 8:26 AM        Slit Lamp and Fundus Exam    Slit Lamp Exam      Right Left   Lids/Lashes Dermatochalasis - upper lid, Meibomian gland dysfunction, inflammed Hordeolum - lower lid - improving Dermatochalasis - upper lid, Meibomian gland dysfunction   Conjunctiva/Sclera White and quiet White and quiet   Cornea 2+ Punctate epithelial erosions 1+ Punctate epithelial erosions   Anterior Chamber Deep and quiet Deep and quiet   Iris Round and dilated Round and dilated   Lens 2-3+ Nuclear sclerosis, 2-3+ Cortical cataract 2-3+ Nuclear sclerosis, 2-3+ Cortical cataract   Vitreous Vitreous syneresis Vitreous syneresis       Fundus Exam      Right Left   Disc Pink and Sharp Pink and Sharp   C/D Ratio 0.4 0.5   Macula Blunted foveal reflex, focal IRH and edema IT to fovea -- improving Flat, Good foveal reflex, Retinal pigment epithelial mottling, mild peripapillary pigmentation   Vessels Vascular attenuation, Tortuous greatest inferior temporal arcades, AV crossing changes Vascular attenuation, Tortuous greatest inferior temporal arcades, AV crossing changes   Periphery Attached, no heme Attached             IMAGING AND PROCEDURES  Imaging and Procedures for @TODAY @  OCT, Retina - OU - Both Eyes       Right Eye Quality was good. Central Foveal Thickness: 426. Progression has improved. Findings include abnormal foveal contour, intraretinal fluid, no SRF, intraretinal hyper-reflective material (Mild interval improvement in  IRF/IRHM; Partial PVD).   Left Eye Quality was good. Central Foveal Thickness: 326. Progression has been stable. Findings include normal foveal contour, no IRF, no SRF (Partial PVD).   Notes *Images captured and stored on drive  Diagnosis / Impression:  OD: BRVO w/ mild interval improvement in IRF; no SRF OS: NFP, no IRF/SRF  Clinical management:  See below  Abbreviations: NFP - Normal foveal profile. CME - cystoid macular edema. PED -  pigment epithelial detachment. IRF - intraretinal fluid. SRF - subretinal fluid. EZ - ellipsoid zone. ERM - epiretinal membrane. ORA - outer retinal atrophy. ORT - outer retinal tubulation. SRHM - subretinal hyper-reflective material        Intravitreal Injection, Pharmacologic Agent - OD - Right Eye       Time Out 04/10/2020. 8:21 AM. Confirmed correct patient, procedure, site, and patient consented.   Anesthesia Topical anesthesia was used. Anesthetic medications included Lidocaine 2%, Proparacaine 0.5%.   Procedure Preparation included 5% betadine to ocular surface, eyelid speculum. A (32g) needle was used.   Injection:  2 mg aflibercept Gretta Cool) SOLN   NDC: L6038910, Lot: [id number], Expiration date: 06/24/2020   Route: Intravitreal, Site: Right Eye, Waste: 0.05 mL  Post-op Post injection exam found visual acuity of at least counting fingers. The patient tolerated the procedure well. There were no complications. The patient received written and verbal post procedure care education.                 ASSESSMENT/PLAN:    ICD-10-CM   1. Branch retinal vein occlusion of right eye with macular edema  H34.8310 Intravitreal Injection, Pharmacologic Agent - OD - Right Eye    aflibercept (EYLEA) SOLN 2 mg  2. Retinal edema  H35.81 OCT, Retina - OU - Both Eyes  3. Essential hypertension  I10   4. Hypertensive retinopathy of both eyes  H35.033   5. Combined forms of age-related cataract of both eyes  H25.813     1,2. BRVO with CME  OD  - FA 5.14.21 shows +focal telangectasias with late leakage inferior macula consistent with remote inf BRVO  - S/P IVA OD #1 (05.14.21), #2 (06.14.21), #3 (07.16.21) -- IVA resistance  - s/p IVE OD #1 (08.18.21) -- sample, #2 (09.15.2021)  - BCVA 20/20 OD (stable)  - OCT shows mild interval improvement in IRF -- good response to IVE  - recommend IVE OD #3 today, 10.18.2021   - pt wishes to proceed  - RBA of procedure discussed, questions answered  - informed consent obtained  - Eylea informed consent form signed and scanned on 08.18.2021  - see procedure note  - [employer] benefits investigation started, 08.18.21 -- approved as of 09.15.21 -- secondary ins pays  - F/U 4 weeks -- DFE/OCT/possible injection  3,4. Hypertensive retinopathy OU  - discussed importance of tight BP control  - monitor   5. Mixed form cataracts OU  - The symptoms of cataract, surgical options, and treatments and risks were discussed with patient.  - discussed diagnosis and progression  - not yet visually significant  - monitor for now   Ophthalmic Meds Ordered this visit:  Meds ordered this encounter  Medications   aflibercept (EYLEA) SOLN 2 mg       Return in about 4 weeks (around 05/08/2020) for f/u BRVO OD, DFE, OCT.  There are no Patient Instructions on file for this visit.  This document serves as a record of services personally performed by Karie Chimera, MD, PhD. It was created on their behalf by Herby Abraham, COA, an ophthalmic technician. The creation of this record is the provider's dictation and/or activities during the visit.    Electronically signed by: Herby Abraham, COA 10.14.2021 8:50 AM   This document serves as a record of services personally performed by Karie Chimera, MD, PhD. It was created on their behalf by Glee Arvin. Manson Passey, OA an ophthalmic technician. The creation of this record is the provider's dictation  and/or activities during the visit.    Electronically signed  by: Glee Arvin. Manson Passey, [place] 10.18.2021 8:50 AM  Karie Chimera, M.D., Ph.D. Diseases & Surgery of the Retina and Vitreous [facility] 04/10/2020   I have reviewed the above documentation for accuracy and completeness, and I agree with the above. Karie Chimera, M.D., Ph.D. 04/10/20 8:50 AM   Abbreviations: M myopia (nearsighted); A astigmatism; H hyperopia (farsighted); P presbyopia; Mrx spectacle prescription;  CTL contact lenses; OD right eye; OS left eye; OU both eyes  XT exotropia; ET esotropia; PEK punctate epithelial keratitis; PEE punctate epithelial erosions; DES dry eye syndrome; MGD meibomian gland dysfunction; ATs artificial tears; PFAT's preservative free artificial tears; NSC nuclear sclerotic cataract; PSC posterior subcapsular cataract; ERM epi-retinal membrane; PVD posterior vitreous detachment; RD retinal detachment; DM diabetes mellitus; DR diabetic retinopathy; NPDR non-proliferative diabetic retinopathy; PDR proliferative diabetic retinopathy; CSME clinically significant macular edema; DME diabetic macular edema; dbh dot blot hemorrhages; CWS cotton wool spot; POAG primary open angle glaucoma; C/D cup-to-disc ratio; HVF humphrey visual field; GVF goldmann visual field; OCT optical coherence tomography; IOP intraocular pressure; BRVO Branch retinal vein occlusion; CRVO central retinal vein occlusion; CRAO central retinal artery occlusion; BRAO branch retinal artery occlusion; RT retinal tear; SB scleral buckle; PPV pars plana vitrectomy; VH Vitreous hemorrhage; PRP panretinal laser photocoagulation; IVK intravitreal kenalog; VMT vitreomacular traction; MH Macular hole;  NVD neovascularization of the disc; NVE neovascularization elsewhere; AREDS age related eye disease study; ARMD age related macular degeneration; POAG primary open angle glaucoma; EBMD epithelial/anterior basement membrane dystrophy; ACIOL anterior chamber intraocular lens; IOL intraocular lens;  PCIOL posterior chamber intraocular lens; Phaco/IOL phacoemulsification with intraocular lens placement; PRK photorefractive keratectomy; LASIK laser assisted in situ keratomileusis; HTN hypertension; DM diabetes mellitus; COPD chronic obstructive pulmonary disease

## 2020-04-10 ENCOUNTER — Encounter (INDEPENDENT_AMBULATORY_CARE_PROVIDER_SITE_OTHER): Payer: Self-pay | Admitting: Ophthalmology

## 2020-04-10 ENCOUNTER — Ambulatory Visit (INDEPENDENT_AMBULATORY_CARE_PROVIDER_SITE_OTHER): Payer: Medicare Other | Admitting: Ophthalmology

## 2020-04-10 ENCOUNTER — Other Ambulatory Visit: Payer: Self-pay

## 2020-04-10 DIAGNOSIS — H3581 Retinal edema: Secondary | ICD-10-CM | POA: Diagnosis not present

## 2020-04-10 DIAGNOSIS — I10 Essential (primary) hypertension: Secondary | ICD-10-CM

## 2020-04-10 DIAGNOSIS — H35033 Hypertensive retinopathy, bilateral: Secondary | ICD-10-CM

## 2020-04-10 DIAGNOSIS — H34831 Tributary (branch) retinal vein occlusion, right eye, with macular edema: Secondary | ICD-10-CM

## 2020-04-10 DIAGNOSIS — H25813 Combined forms of age-related cataract, bilateral: Secondary | ICD-10-CM

## 2020-04-10 DIAGNOSIS — H00012 Hordeolum externum right lower eyelid: Secondary | ICD-10-CM

## 2020-04-10 MED ORDER — AFLIBERCEPT 2MG/0.05ML IZ SOLN FOR KALEIDOSCOPE
2.0000 mg | INTRAVITREAL | Status: AC | PRN
  Administered 2020-04-10: 2 mg via INTRAVITREAL

## 2020-05-03 ENCOUNTER — Ambulatory Visit: Payer: Medicare Other

## 2020-05-08 IMAGING — CT CT CERVICAL SPINE WITHOUT CONTRAST
3 of 4 series · 13 of 35 positions shown, 16 images · non-contrast
Comparison: None.

CLINICAL DATA: Fall. Tripped over sidewalk with laceration to
forehead. Headache.

EXAM:
CT HEAD WITHOUT CONTRAST
CT CERVICAL SPINE WITHOUT CONTRAST
TECHNIQUE: Multidetector CT imaging of the head and cervical spine was
performed following the standard protocol without intravenous
contrast. Multiplanar CT image reconstructions of the cervical spine
were also generated.

[Series 5: orthogonal bone · axial · 0.29mm/px · z∈[-298,-172]mm · 5 of 105 slices shown, 7 images]
[im 18/105  soft-tissue]
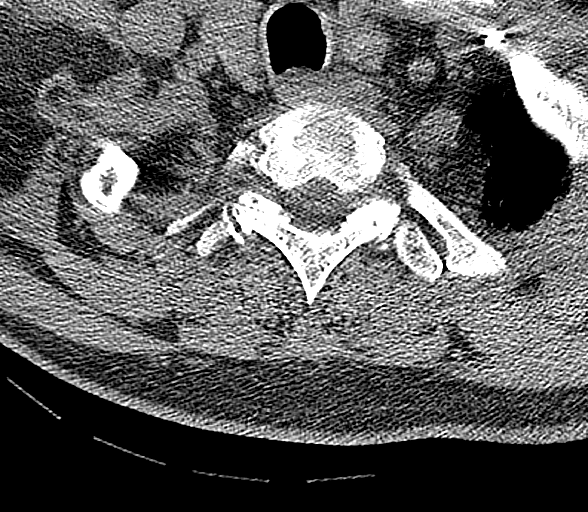
[im 18/105  bone]
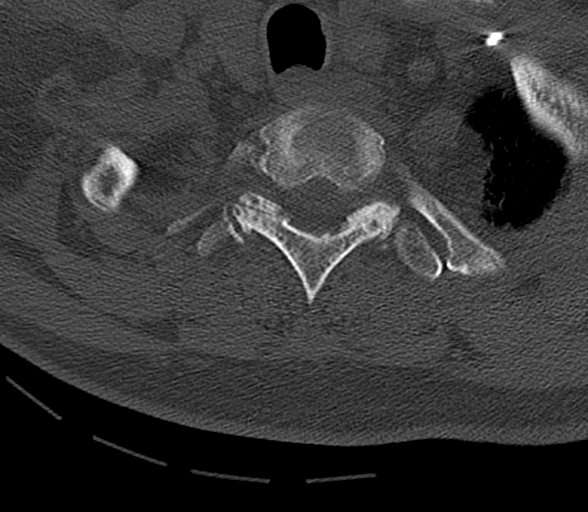
[im 35/105  bone]
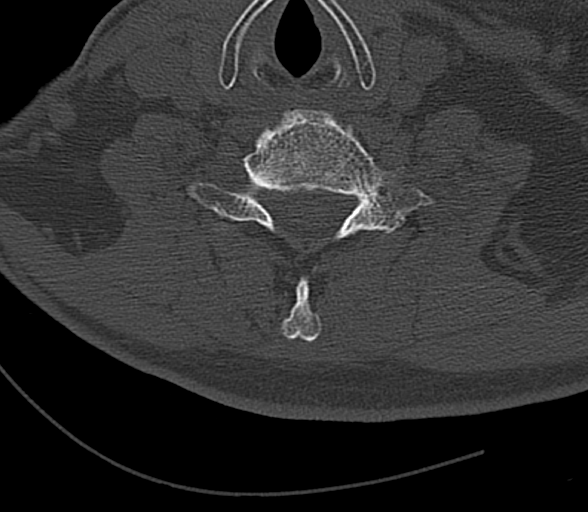
[im 53/105  bone]
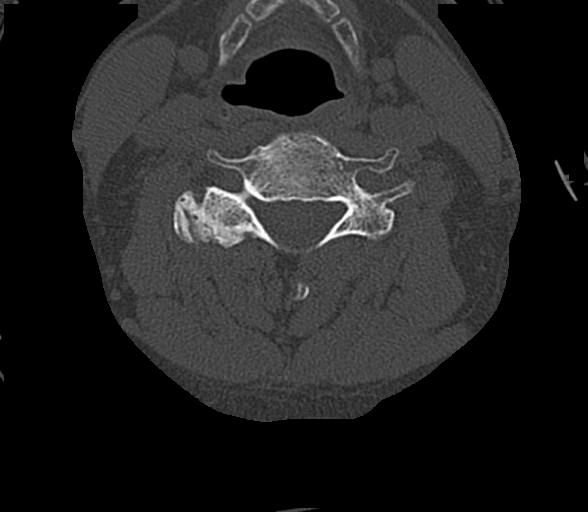
[im 70/105  bone]
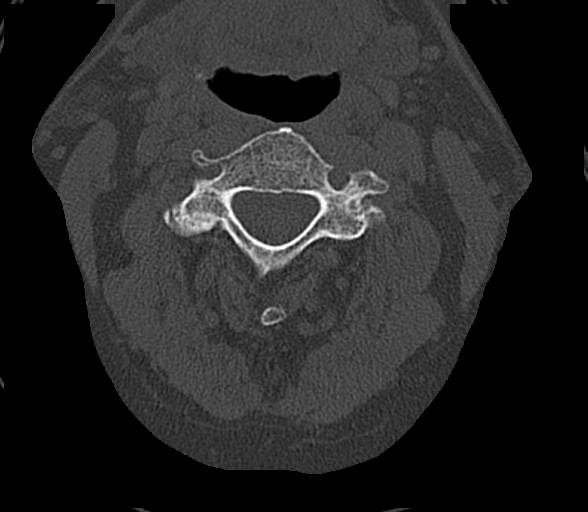
[im 87/105  soft-tissue]
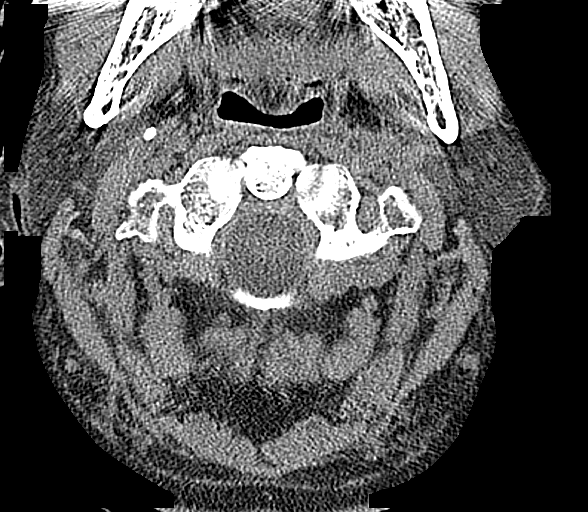
[im 87/105  bone]
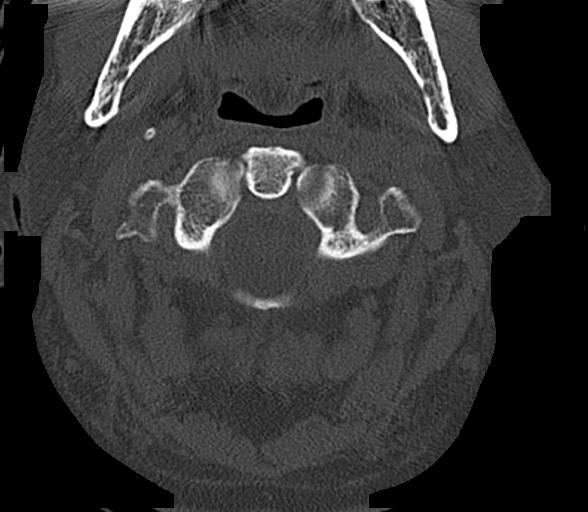

[Series 6: coronal bone · coronal · 0.35mm/px · 3 of 70 slices shown]
[im 17/70  bone]
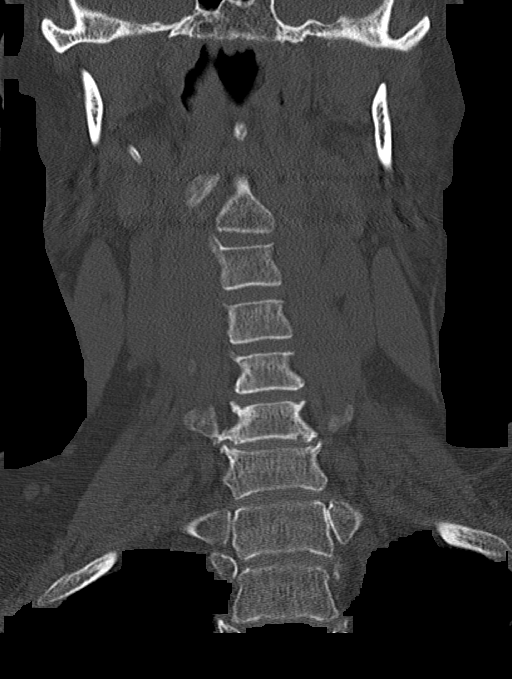
[im 29/70  bone]
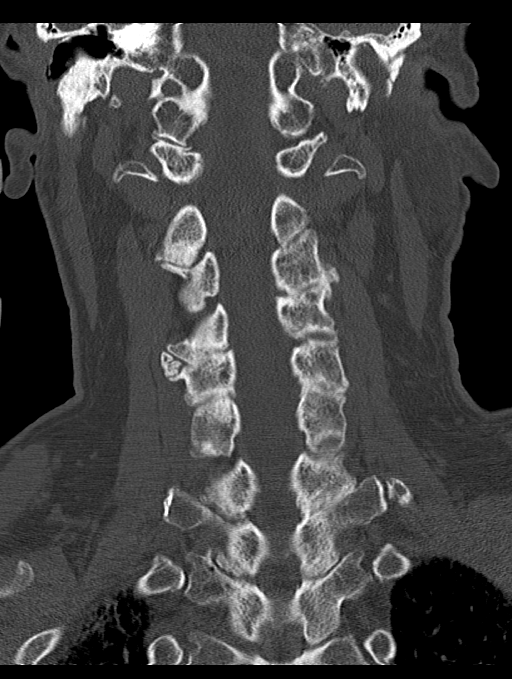
[im 41/70  bone]
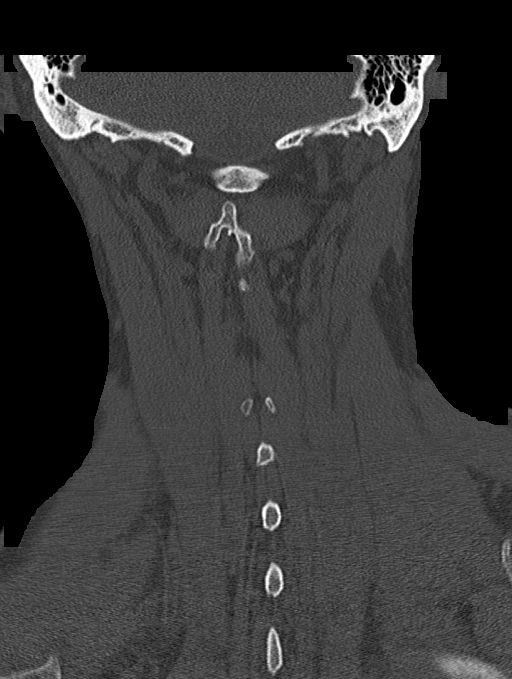

[Series 7: sagittal bone · sagittal · 0.41mm/px · 5 of 62 slices shown, 6 images]
[im 21/62  bone]
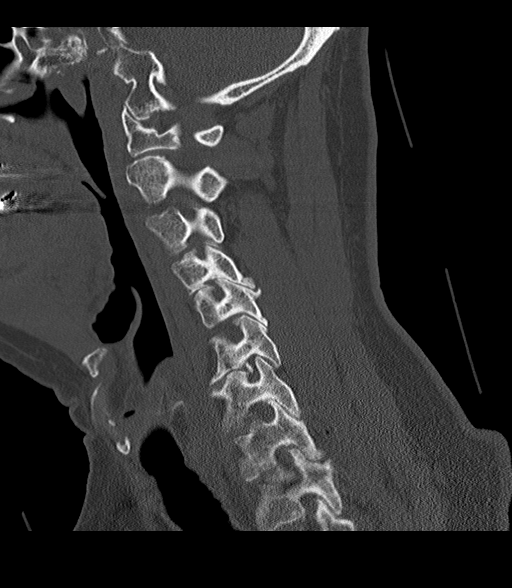
[im 26/62  bone]
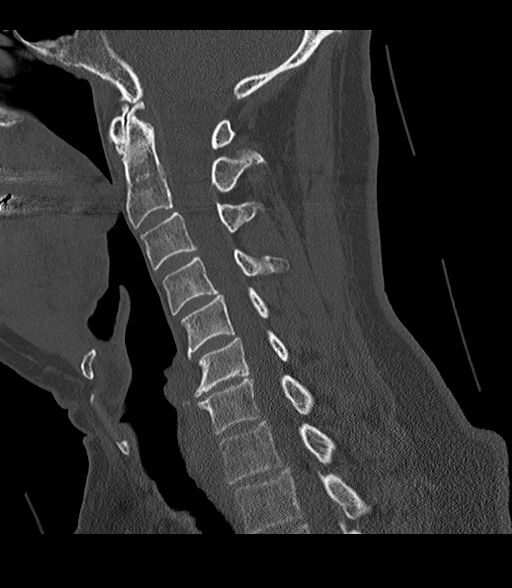
[im 31/62  soft-tissue]
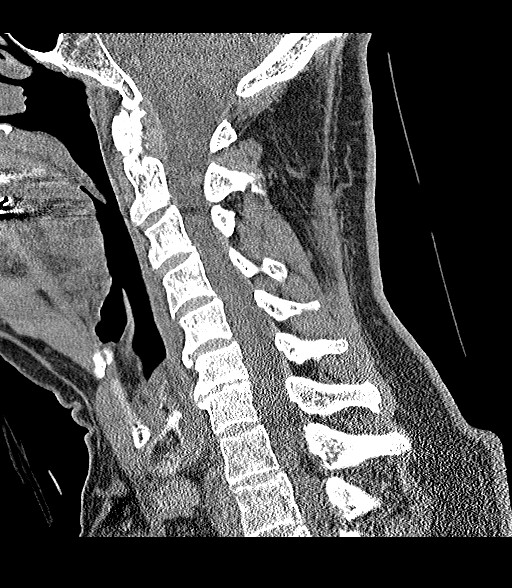
[im 31/62  bone]
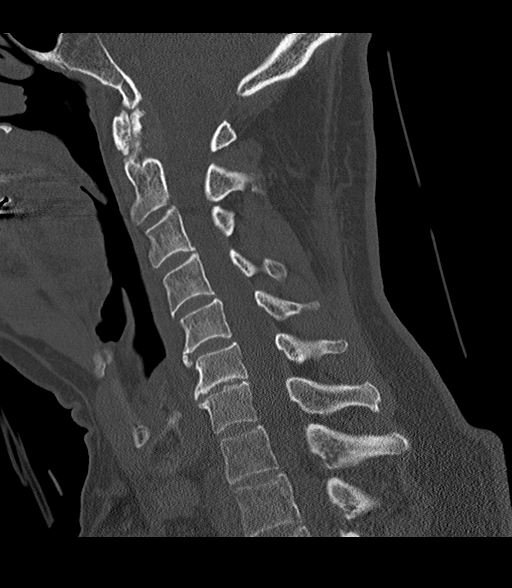
[im 36/62  bone]
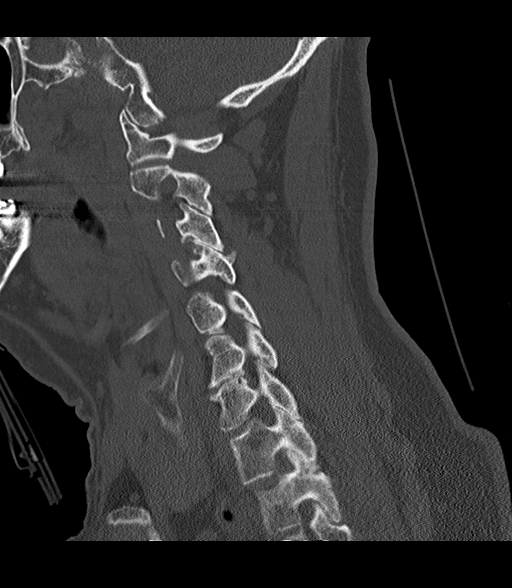
[im 41/62  bone]
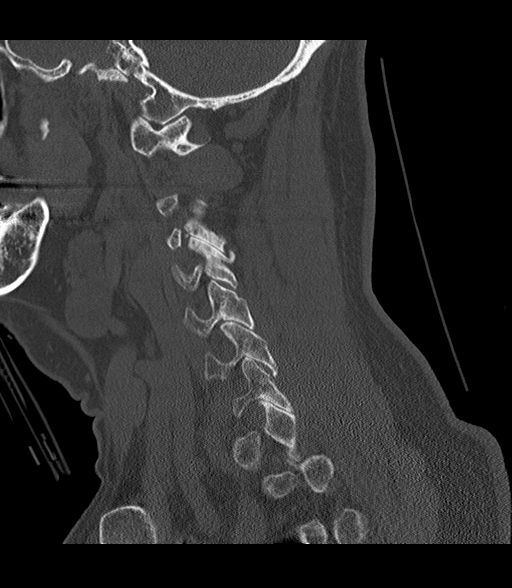

[13 of 35 positions shown; findings below may reference images not displayed]

FINDINGS: CT HEAD FINDINGS

Brain: There is no evidence of acute infarct, intracranial
hemorrhage, mass, midline shift, or extra-axial fluid collection.
Mild cerebral atrophy is not greater than expected for age.

Vascular: Mild calcific atherosclerosis involving the carotid
siphons. No hyperdense vessel.

Skull: No fracture or focal osseous lesion.

Sinuses/Orbits: Visualized paranasal sinuses are clear. There are
trace mastoid effusions. Orbits are unremarkable.

Other: Small forehead hematoma/laceration.

CT CERVICAL SPINE FINDINGS

Alignment: Cervical spine straightening with trace anterolisthesis
of C4 on C5 and C5 on C6, likely degenerative.

Skull base and vertebrae: No acute fracture or suspicious osseous
lesion.

Soft tissues and spinal canal: No prevertebral fluid or swelling. No
visible canal hematoma.

Disc levels: Mild disc degeneration in the mid to lower cervical
spine. Moderate right neural foraminal stenosis at C4-5 due to
uncovertebral and facet spurring. Moderate right neural foraminal
stenosis at C6-7 due to uncovertebral spurring.

Upper chest: No apical lung consolidation or mass.

Other: None.
IMPRESSION: 1. No evidence of acute intracranial abnormality.
2. Small forehead hematoma/laceration.
3. No acute cervical spine fracture.

## 2020-05-08 NOTE — Progress Notes (Deleted)
[facility] - Clinic Note  05/10/2020     CHIEF COMPLAINT Patient presents for No chief complaint on file.   HISTORY OF PRESENT ILLNESS: [name] [name] is a 74 y.o. male who presents to the clinic today for:   pt states   Referring physician: Margot Ables, MD [address] [place],  [place] [address]  HISTORICAL INFORMATION:   Selected notes from the MEDICAL RECORD NUMBER Referred by [name] for concern of vein occlusion OD LEE:  Ocular Hx- PMH-   CURRENT MEDICATIONS: No current outpatient medications on file. (Ophthalmic Drugs)   No current facility-administered medications for this visit. (Ophthalmic Drugs)   Current Outpatient Medications (Other)  Medication Sig  . acetaminophen (TYLENOL) 500 MG tablet Take 500-1,000 mg by mouth every 6 (six) hours as needed for moderate pain or headache.  . Cyanocobalamin (B-12 PO) Take 1 capsule by mouth daily.  . hydrOXYzine (ATARAX/VISTARIL) 25 MG tablet Take 25 mg by mouth 2 (two) times daily.  Marland Kitchen levothyroxine (SYNTHROID, LEVOTHROID) 100 MCG tablet TAKE 1 TABLET BY MOUTH ONCE DAILY  . MELATONIN PO Take 1 tablet by mouth at bedtime.  . Multiple Vitamin (MULTIVITAMIN WITH MINERALS) TABS tablet Take 1 tablet by mouth daily.  . pravastatin (PRAVACHOL) 40 MG tablet TAKE 1 TABLET BY MOUTH EVERY DAY  . sertraline (ZOLOFT) 100 MG tablet Take 1.5 tablets (150 mg total) by mouth daily.  . tamsulosin (FLOMAX) 0.4 MG CAPS capsule Take by mouth.  Marland Kitchen VITAMIN D PO Take 1 capsule by mouth daily.   No current facility-administered medications for this visit. (Other)      REVIEW OF SYSTEMS:    ALLERGIES Allergies  Allergen Reactions  . Egg White (Diagnostic)     Showed up on allergy test  . Penicillins     Did it involve swelling of the face/tongue/throat, SOB, or low BP? No Did it involve sudden or severe rash/hives, skin peeling, or any reaction on the inside of your mouth or nose?  No Did you need to seek medical attention at a hospital or doctor's office? Unknown When did it last happen?Childhood allergy If all above answers are "NO", may proceed with cephalosporin use.   . Shrimp [Shellfish Allergy]     Showed up on allergy test  . Wheat Bran     Showed up on allergy test  . Sulfa Antibiotics Rash    PAST MEDICAL HISTORY Past Medical History:  Diagnosis Date  . 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 History of skin cancer 04/29/2017  . History of skin cancer 04/29/2017  . Hypothyroidism 04/29/2017  . Itching 04/29/2017  . Scabies 05/28/2017   Past Surgical History:  Procedure Laterality Date  . PPM GENERATOR CHANGEOUT N/A 06/14/2019   Procedure: PPM GENERATOR CHANGEOUT;  Surgeon: [name], MD;  Location: [facility] INVASIVE CV LAB;  Service: Cardiovascular;  Laterality: N/A;    FAMILY HISTORY Family History  Problem Relation Age of Onset  . Hypertension Brother     SOCIAL HISTORY Social History   Tobacco Use  . Smoking status: Never Smoker  . Smokeless tobacco: Never Used  Substance Use Topics  . Alcohol use: Yes  . Drug use: No         OPHTHALMIC EXAM:  Not recorded     IMAGING AND PROCEDURES  Imaging and Procedures for @TODAY @           ASSESSMENT/PLAN:  No diagnosis found.  1,2. BRVO with CME OD  -  FA 5.14.21 shows +focal telangectasias with late leakage inferior macula consistent with remote inf BRVO  - S/P IVA OD #1 (05.14.21), #2 (06.14.21), #3 (07.16.21) -- IVA resistance  - s/p IVE OD #1 (08.18.21) -- sample, #2 (09.15.2021), #3 (10.18.2021)  - BCVA 20/20 OD (stable)  - OCT shows mild interval improvement in IRF -- good response to IVE  - recommend IVE OD #4 today, 11.17.2021   - pt wishes to proceed  - RBA of procedure discussed, questions answered  - informed consent obtained  - Eylea informed consent form signed and scanned on 08.18.2021  - see procedure note  - [employer] benefits investigation started,  08.18.21 -- approved as of 09.15.21 -- secondary ins pays  - F/U 4 weeks -- DFE/OCT/possible injection  3,4. Hypertensive retinopathy OU  - discussed importance of tight BP control  - monitor   5. Mixed form cataracts OU  - The symptoms of cataract, surgical options, and treatments and risks were discussed with patient.  - discussed diagnosis and progression  - not yet visually significant  - monitor for now   Ophthalmic Meds Ordered this visit:  No orders of the defined types were placed in this encounter.      No follow-ups on file.  There are no Patient Instructions on file for this visit.  This document serves as a record of services personally performed by Karie Chimera, MD, PhD. It was created on their behalf by Annalee Genta, COMT. The creation of this record is the provider's dictation and/or activities during the visit.  Electronically signed by: Annalee Genta, COMT 05/08/20 10:51 AM    Karie Chimera, M.D., Ph.D. Diseases & Surgery of the Retina and Vitreous [facility] 04/10/2020     Abbreviations: M myopia (nearsighted); A astigmatism; H hyperopia (farsighted); P presbyopia; Mrx spectacle prescription;  CTL contact lenses; OD right eye; OS left eye; OU both eyes  XT exotropia; ET esotropia; PEK punctate epithelial keratitis; PEE punctate epithelial erosions; DES dry eye syndrome; MGD meibomian gland dysfunction; ATs artificial tears; PFAT's preservative free artificial tears; NSC nuclear sclerotic cataract; PSC posterior subcapsular cataract; ERM epi-retinal membrane; PVD posterior vitreous detachment; RD retinal detachment; DM diabetes mellitus; DR diabetic retinopathy; NPDR non-proliferative diabetic retinopathy; PDR proliferative diabetic retinopathy; CSME clinically significant macular edema; DME diabetic macular edema; dbh dot blot hemorrhages; CWS cotton wool spot; POAG primary open angle glaucoma; C/D cup-to-disc ratio; HVF humphrey  visual field; GVF goldmann visual field; OCT optical coherence tomography; IOP intraocular pressure; BRVO Branch retinal vein occlusion; CRVO central retinal vein occlusion; CRAO central retinal artery occlusion; BRAO branch retinal artery occlusion; RT retinal tear; SB scleral buckle; PPV pars plana vitrectomy; VH Vitreous hemorrhage; PRP panretinal laser photocoagulation; IVK intravitreal kenalog; VMT vitreomacular traction; MH Macular hole;  NVD neovascularization of the disc; NVE neovascularization elsewhere; AREDS age related eye disease study; ARMD age related macular degeneration; POAG primary open angle glaucoma; EBMD epithelial/anterior basement membrane dystrophy; ACIOL anterior chamber intraocular lens; IOL intraocular lens; PCIOL posterior chamber intraocular lens; Phaco/IOL phacoemulsification with intraocular lens placement; PRK photorefractive keratectomy; LASIK laser assisted in situ keratomileusis; HTN hypertension; DM diabetes mellitus; COPD chronic obstructive pulmonary disease

## 2020-05-09 NOTE — Progress Notes (Signed)
[facility] Clinic Note  05/10/2020     CHIEF COMPLAINT Patient presents for Retina Follow Up   HISTORY OF PRESENT ILLNESS: [name] [name] is a 74 y.o. male who presents to the clinic today for:   HPI    Retina Follow Up    Patient presents with  CRVO/BRVO.  In right eye.  This started months ago.  Severity is mild.  Duration of 4 weeks.  Since onset it is stable.  I, the attending physician,  performed the HPI with the patient and updated documentation appropriately.          Comments    74 y/o male pt here for 4 wk f/u for BRVO w/mac edema OD.  No change in [place] OU.  Denies pain, FOL, floaters.  No gtts.       Last edited by Bernarda Caffey, MD on 05/10/2020  8:55 AM. (History)    pt states vision is stable   Referring physician: Buzzy Han, MD [place],  [place] [address]  HISTORICAL INFORMATION:   Selected notes from the MEDICAL RECORD NUMBER Referred by Dr. [place] DeMarco for concern of vein occlusion OD LEE:  Ocular Hx- PMH-   CURRENT MEDICATIONS: No current outpatient medications on file. (Ophthalmic Drugs)   No current facility-administered medications for this visit. (Ophthalmic Drugs)   Current Outpatient Medications (Other)  Medication Sig  . acetaminophen (TYLENOL) 500 MG tablet Take 500-1,000 mg by mouth every 6 (six) hours as needed for moderate pain or headache.  . Cyanocobalamin (B-12 PO) Take 1 capsule by mouth daily.  . finasteride (PROSCAR) 5 MG tablet Take 5 mg by mouth daily.  . hydrOXYzine (ATARAX/VISTARIL) 25 MG tablet Take 25 mg by mouth 2 (two) times daily.  Marland Kitchen levothyroxine (SYNTHROID, LEVOTHROID) 100 MCG tablet TAKE 1 TABLET BY MOUTH ONCE DAILY  . MELATONIN PO Take 1 tablet by mouth at bedtime.  . Multiple Vitamin (MULTIVITAMIN WITH MINERALS) TABS tablet Take 1 tablet by mouth daily.  . pravastatin (PRAVACHOL) 40 MG tablet TAKE 1 TABLET BY MOUTH EVERY DAY  . sertraline (ZOLOFT) 100 MG  tablet Take 1.5 tablets (150 mg total) by mouth daily.  . tamsulosin (FLOMAX) 0.4 MG CAPS capsule Take by mouth.  Marland Kitchen VITAMIN D PO Take 1 capsule by mouth daily.   No current facility-administered medications for this visit. (Other)      REVIEW OF SYSTEMS: ROS    Positive for: Cardiovascular, Eyes   Negative for: Constitutional, Gastrointestinal, Neurological, Skin, Genitourinary, Musculoskeletal, HENT, Endocrine, Respiratory, Psychiatric, Allergic/Imm, Heme/Lymph   Last edited by Matthew Folks, COA on 05/10/2020  8:21 AM. (History)       ALLERGIES Allergies  Allergen Reactions  . Egg White (Diagnostic)     Showed up on allergy test  . Penicillins     Did it involve swelling of the face/tongue/throat, SOB, or low BP? No Did it involve sudden or severe rash/hives, skin peeling, or any reaction on the inside of your mouth or nose? No Did you need to seek medical attention at a hospital or doctor's office? Unknown When did it last happen?Childhood allergy If all above answers are "NO", may proceed with cephalosporin use.   . Shrimp [Shellfish Allergy]     Showed up on allergy test  . Wheat Bran     Showed up on allergy test  . Sulfa Antibiotics Rash    PAST MEDICAL HISTORY Past Medical History:  Diagnosis Date  . 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 Cataract    Mixed form OU  . History of skin cancer 04/29/2017  . History of skin cancer 04/29/2017  . Hypertensive retinopathy    OU  . Hypothyroidism 04/29/2017  . Itching 04/29/2017  . Scabies 05/28/2017   Past Surgical History:  Procedure Laterality Date  . PPM GENERATOR CHANGEOUT N/A 06/14/2019   Procedure: PPM GENERATOR CHANGEOUT;  Surgeon: Evans Lance, MD;  Location: [facility];  Service: Cardiovascular;  Laterality: N/A;    FAMILY HISTORY Family History  Problem Relation Age of Onset  . Hypertension Brother     SOCIAL HISTORY Social History   Tobacco Use  . Smoking status: Never  Smoker  . Smokeless tobacco: Never Used  Substance Use Topics  . Alcohol use: Yes  . Drug use: No         OPHTHALMIC EXAM:  Base Eye Exam    Visual Acuity (Snellen - Linear)      Right Left   Dist [place] 20/20 -2 20/25 -2   Dist ph Tower  NI       Tonometry (Tonopen, 8:23 AM)      Right Left   Pressure 16 14       Pupils      Dark Light Shape React APD   Right 3 2 Round Brisk None   Left 3 2 Round Brisk None       Visual Fields (Counting fingers)      Left Right    Full Full       Extraocular Movement      Right Left    Full, Ortho Full, Ortho       Neuro/Psych    Oriented x3: Yes   Mood/Affect: Normal       Dilation    Both eyes: 1.0% Mydriacyl, 2.5% Phenylephrine @ 8:23 AM        Slit Lamp and Fundus Exam    Slit Lamp Exam      Right Left   Lids/Lashes Dermatochalasis - upper lid, Meibomian gland dysfunction, inflammed Hordeolum - lower lid - improving Dermatochalasis - upper lid, Meibomian gland dysfunction   Conjunctiva/Sclera White and quiet White and quiet   Cornea trace Punctate epithelial erosions Trace inferior Punctate epithelial erosions, mild EBMD   Anterior Chamber Deep and quiet Deep and quiet   Iris Round and dilated Round and dilated   Lens 2-3+ Nuclear sclerosis, 2-3+ Cortical cataract 2-3+ Nuclear sclerosis, 2-3+ Cortical cataract   Vitreous Vitreous syneresis Vitreous syneresis       Fundus Exam      Right Left   Disc Pink and Sharp Pink and Sharp   C/D Ratio 0.5 0.6   Macula Blunted foveal reflex, focal IRH and edema IT to fovea -- slightly improved Flat, Good foveal reflex, Retinal pigment epithelial mottling, mild peripapillary pigmentation, No heme or edema   Vessels attenuated, Tortuous, mild AV crossing changes attenuated, Tortuous   Periphery Attached, no heme Attached             IMAGING AND PROCEDURES  Imaging and Procedures for _0 @  OCT, Retina - OU - Both Eyes       Right Eye Quality was good. Central Foveal  Thickness: 412. Progression has improved. Findings include abnormal foveal contour, intraretinal fluid, no SRF, intraretinal hyper-reflective material (Mild interval improvement in trace cystic changes; Partial PVD).   Left Eye Quality was good. Central Foveal Thickness: 329. Progression has been stable. Findings include  normal foveal contour, no IRF, no SRF (Partial PVD).   Notes *Images captured and stored on drive  Diagnosis / Impression:  OD: BRVO w/ Mild interval improvement in trace cystic changes; Partial PVD OS: NFP, no IRF/SRF  Clinical management:  See below  Abbreviations: NFP - Normal foveal profile. CME - cystoid macular edema. PED - pigment epithelial detachment. IRF - intraretinal fluid. SRF - subretinal fluid. EZ - ellipsoid zone. ERM - epiretinal membrane. ORA - outer retinal atrophy. ORT - outer retinal tubulation. SRHM - subretinal hyper-reflective material        Intravitreal Injection, Pharmacologic Agent - OD - Right Eye       Time Out 05/10/2020. 8:39 AM. Confirmed correct patient, procedure, site, and patient consented.   Anesthesia Topical anesthesia was used. Anesthetic medications included Lidocaine 2%, Proparacaine 0.5%.   Procedure Preparation included 5% betadine to ocular surface, eyelid speculum. A (32g) needle was used.   Injection:  2 mg aflibercept Alfonse Flavors) SOLN   NDC: A3590391, Lot: [id number], Expiration date: 08/21/2020   Route: Intravitreal, Site: Right Eye, Waste: 0.05 mL  Post-op Post injection exam found visual acuity of at least counting fingers. The patient tolerated the procedure well. There were no complications. The patient received written and verbal post procedure care education. Post injection medications were not given.                 ASSESSMENT/PLAN:    ICD-10-CM   1. Branch retinal vein occlusion of right eye with macular edema  H34.8310 Intravitreal Injection, Pharmacologic Agent - OD - Right Eye     aflibercept (EYLEA) SOLN 2 mg  2. Retinal edema  H35.81 OCT, Retina - OU - Both Eyes  3. Essential hypertension  I10   4. Hypertensive retinopathy of both eyes  H35.033   5. Combined forms of age-related cataract of both eyes  H25.813     1,2. BRVO with CME OD  - FA 5.14.21 shows +focal telangectasias with late leakage inferior macula consistent with remote inf BRVO  - S/P IVA OD #1 (05.14.21), #2 (06.14.21), #3 (07.16.21) -- IVA resistance  - s/p IVE OD #1 (08.18.21) -- sample, #2 (09.15.21), #3 (10.18.21)  - BCVA 20/20 OD (stable)  - OCT shows mild interval improvement in IRF -- good response to IVE  - recommend IVE OD #4 today, 11.17.21   - pt wishes to proceed  - RBA of procedure discussed, questions answered  - informed consent obtained  - Eylea informed consent form signed and scanned on 08.18.2021  - see procedure note  - [employer] benefits investigation started, 08.18.21 -- approved as of 09.15.21 -- secondary ins pays  - discussed potential for focal laser -- pt wishes to hold off  - F/U 4 weeks -- DFE/OCT/possible injection  3,4. Hypertensive retinopathy OU  - discussed importance of tight BP control  - monitor   5. Mixed form cataracts OU  - The symptoms of cataract, surgical options, and treatments and risks were discussed with patient.  - discussed diagnosis and progression  - not yet visually significant  - monitor for now   Ophthalmic Meds Ordered this visit:  Meds ordered this encounter  Medications  . aflibercept (EYLEA) SOLN 2 mg       Return in about 4 weeks (around 06/07/2020) for f/u BRVO OD, DFE, OCT.  There are no Patient Instructions on file for this visit.  This document serves as a record of services personally performed by Gardiner Sleeper, MD, PhD.  It was created on their behalf by Leeann Must, [place], an ophthalmic technician. The creation of this record is the provider's dictation and/or activities during the visit.    Electronically signed  by: Leeann Must, COA 11.16.2021 9:05 AM   This document serves as a record of services personally performed by Gardiner Sleeper, MD, PhD. It was created on their behalf by San Jetty. Owens Shark, OA an ophthalmic technician. The creation of this record is the provider's dictation and/or activities during the visit.    Electronically signed by: San Jetty. Owens Shark, [place] 11.17.2021 9:05 AM  Gardiner Sleeper, M.D., Ph.D. Diseases & Surgery of the Retina and [place] 05/10/2020   I have reviewed the above documentation for accuracy and completeness, and I agree with the above. Gardiner Sleeper, M.D., Ph.D. 05/10/20 9:05 AM    Abbreviations: M myopia (nearsighted); A astigmatism; H hyperopia (farsighted); P presbyopia; Mrx spectacle prescription;  CTL contact lenses; OD right eye; OS left eye; OU both eyes  XT exotropia; ET esotropia; PEK punctate epithelial keratitis; PEE punctate epithelial erosions; DES dry eye syndrome; MGD meibomian gland dysfunction; ATs artificial tears; PFAT's preservative free artificial tears; [place] nuclear sclerotic cataract; PSC posterior subcapsular cataract; ERM epi-retinal membrane; PVD posterior vitreous detachment; RD retinal detachment; DM diabetes mellitus; DR diabetic retinopathy; NPDR non-proliferative diabetic retinopathy; PDR proliferative diabetic retinopathy; CSME clinically significant macular edema; DME diabetic macular edema; dbh dot blot hemorrhages; CWS cotton wool spot; POAG primary open angle glaucoma; C/D cup-to-disc ratio; HVF humphrey visual field; GVF goldmann visual field; OCT optical coherence tomography; IOP intraocular pressure; BRVO Branch retinal vein occlusion; CRVO central retinal vein occlusion; CRAO central retinal artery occlusion; BRAO branch retinal artery occlusion; RT retinal tear; SB scleral buckle; PPV pars plana vitrectomy; VH Vitreous hemorrhage; PRP panretinal laser photocoagulation; IVK intravitreal kenalog; VMT  vitreomacular traction; MH Macular hole;  NVD neovascularization of the disc; NVE neovascularization elsewhere; AREDS age related eye disease study; ARMD age related macular degeneration; POAG primary open angle glaucoma; EBMD epithelial/anterior basement membrane dystrophy; ACIOL anterior chamber intraocular lens; IOL intraocular lens; PCIOL posterior chamber intraocular lens; Phaco/IOL phacoemulsification with intraocular lens placement; [place] photorefractive keratectomy; LASIK laser assisted in situ keratomileusis; HTN hypertension; DM diabetes mellitus; COPD chronic obstructive pulmonary disease

## 2020-05-10 ENCOUNTER — Ambulatory Visit (INDEPENDENT_AMBULATORY_CARE_PROVIDER_SITE_OTHER): Payer: Medicare Other | Admitting: Ophthalmology

## 2020-05-10 ENCOUNTER — Other Ambulatory Visit: Payer: Self-pay

## 2020-05-10 ENCOUNTER — Encounter (INDEPENDENT_AMBULATORY_CARE_PROVIDER_SITE_OTHER): Payer: Self-pay | Admitting: Ophthalmology

## 2020-05-10 DIAGNOSIS — H3581 Retinal edema: Secondary | ICD-10-CM

## 2020-05-10 DIAGNOSIS — H34831 Tributary (branch) retinal vein occlusion, right eye, with macular edema: Secondary | ICD-10-CM | POA: Diagnosis not present

## 2020-05-10 DIAGNOSIS — I10 Essential (primary) hypertension: Secondary | ICD-10-CM | POA: Diagnosis not present

## 2020-05-10 DIAGNOSIS — H35033 Hypertensive retinopathy, bilateral: Secondary | ICD-10-CM

## 2020-05-10 DIAGNOSIS — H25813 Combined forms of age-related cataract, bilateral: Secondary | ICD-10-CM

## 2020-05-10 DIAGNOSIS — H00012 Hordeolum externum right lower eyelid: Secondary | ICD-10-CM

## 2020-05-10 MED ORDER — AFLIBERCEPT 2MG/0.05ML IZ SOLN FOR KALEIDOSCOPE
2.0000 mg | INTRAVITREAL | Status: AC | PRN
  Administered 2020-05-10: 2 mg via INTRAVITREAL

## 2020-06-01 NOTE — Progress Notes (Signed)
[facility] - Clinic Note  06/07/2020     CHIEF COMPLAINT Patient presents for Retina Follow Up   HISTORY OF PRESENT ILLNESS: [name] [name] is a 74 y.o. male who presents to the clinic today for:   HPI    Retina Follow Up    Patient presents with  CRVO/BRVO.  In right eye.  This started weeks ago.  Severity is moderate.  Duration of weeks.  Since onset it is stable.  I, the attending physician,  performed the HPI with the patient and updated documentation appropriately.          Comments    Pt states vision is the same.  Pt has new floater OD that comes and goes.  Patient does not have flashes of light.  Denies eye pain.       Last edited by Rennis ChrisZamora, Graysyn Bache, MD on 06/10/2020 11:56 PM. (History)    pt states vision is stable, he saw a new floater for a few days   Referring physician: Margot Ableskwubunka-Anyim, [place], MD [address] [place],  [place] [id number]  HISTORICAL INFORMATION:   Selected notes from the MEDICAL RECORD NUMBER Referred by Dr. SwazilandJordan DeMarco for concern of vein occlusion OD LEE:  Ocular Hx- PMH-   CURRENT MEDICATIONS: No current outpatient medications on file. (Ophthalmic Drugs)   No current facility-administered medications for this visit. (Ophthalmic Drugs)   Current Outpatient Medications (Other)  Medication Sig  . acetaminophen (TYLENOL) 500 MG tablet Take 500-1,000 mg by mouth every 6 (six) hours as needed for moderate pain or headache.  . Cyanocobalamin (B-12 PO) Take 1 capsule by mouth daily.  . finasteride (PROSCAR) 5 MG tablet Take 5 mg by mouth daily.  . hydrOXYzine (ATARAX/VISTARIL) 25 MG tablet Take 25 mg by mouth 2 (two) times daily.  Marland Kitchen. levothyroxine (SYNTHROID, LEVOTHROID) 100 MCG tablet TAKE 1 TABLET BY MOUTH ONCE DAILY  . MELATONIN PO Take 1 tablet by mouth at bedtime.  . Multiple Vitamin (MULTIVITAMIN WITH MINERALS) TABS tablet Take 1 tablet by mouth daily.  . pravastatin (PRAVACHOL) 40 MG tablet TAKE 1 TABLET  BY MOUTH EVERY DAY  . sertraline (ZOLOFT) 100 MG tablet Take 1.5 tablets (150 mg total) by mouth daily.  . tamsulosin (FLOMAX) 0.4 MG CAPS capsule Take by mouth.  Marland Kitchen. VITAMIN D PO Take 1 capsule by mouth daily.   No current facility-administered medications for this visit. (Other)      REVIEW OF SYSTEMS: ROS    Positive for: Cardiovascular, Eyes   Negative for: Constitutional, Gastrointestinal, Neurological, Skin, Genitourinary, Musculoskeletal, HENT, Endocrine, Respiratory, Psychiatric, Allergic/Imm, Heme/Lymph   Last edited by Corrinne EagleEnglish, Ashley L on 06/07/2020  8:14 AM. (History)       ALLERGIES Allergies  Allergen Reactions  . Egg White (Diagnostic)     Showed up on allergy test  . Penicillins     Did it involve swelling of the face/tongue/throat, SOB, or low BP? No Did it involve sudden or severe rash/hives, skin peeling, or any reaction on the inside of your mouth or nose? No Did you need to seek medical attention at a hospital or doctor's office? Unknown When did it last happen?Childhood allergy If all above answers are "NO", may proceed with cephalosporin use.   . Shrimp [Shellfish Allergy]     Showed up on allergy test  . Wheat Bran     Showed up on allergy test  . Sulfa Antibiotics Rash    PAST MEDICAL HISTORY Past Medical History:  Diagnosis Date  . 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 Cataract    Mixed form OU  . History of skin cancer 04/29/2017  . History of skin cancer 04/29/2017  . Hypertensive retinopathy    OU  . Hypothyroidism 04/29/2017  . Itching 04/29/2017  . Scabies 05/28/2017   Past Surgical History:  Procedure Laterality Date  . PPM GENERATOR CHANGEOUT N/A 06/14/2019   Procedure: PPM GENERATOR CHANGEOUT;  Surgeon: [name], MD;  Location: [facility] INVASIVE CV LAB;  Service: Cardiovascular;  Laterality: N/A;    FAMILY HISTORY Family History  Problem Relation Age of Onset  . Hypertension Brother     SOCIAL HISTORY Social History    Tobacco Use  . Smoking status: Never Smoker  . Smokeless tobacco: Never Used  Substance Use Topics  . Alcohol use: Yes  . Drug use: No         OPHTHALMIC EXAM:  Base Eye Exam    Visual Acuity (Snellen - Linear)      Right Left   Dist [place] 20/20 -2 20/25 -2   Dist ph [place]  20/20 -1        Tonometry (Tonopen, 8:22 AM)      Right Left   Pressure 13 13       Pupils      Dark Light Shape React APD   Right 3 2 Round Brisk 0   Left 3 2 Round Brisk 0       Visual Fields      Left Right    Full Full       Extraocular Movement      Right Left    Full Full       Neuro/Psych    Oriented x3: Yes   Mood/Affect: Normal       Dilation    Both eyes: 1.0% Mydriacyl, 2.5% Phenylephrine @ 8:22 AM        Slit Lamp and Fundus Exam    Slit Lamp Exam      Right Left   Lids/Lashes Dermatochalasis - upper lid, Meibomian gland dysfunction, inflammed Hordeolum - lower lid - improving Dermatochalasis - upper lid, Meibomian gland dysfunction   Conjunctiva/Sclera White and quiet White and quiet   Cornea trace Punctate epithelial erosions Trace inferior Punctate epithelial erosions, mild EBMD   Anterior Chamber Deep and quiet Deep and quiet   Iris Round and dilated Round and dilated   Lens 2-3+ Nuclear sclerosis, 2-3+ Cortical cataract 2-3+ Nuclear sclerosis, 2-3+ Cortical cataract   Vitreous Vitreous syneresis, Posterior vitreous detachment, vitreous condensations Vitreous syneresis       Fundus Exam      Right Left   Disc Pink and Sharp Pink and Sharp, focal PPP   C/D Ratio 0.5 0.6   Macula Blunted foveal reflex, focal IRH and edema IT to fovea -- slightly improved Flat, Good foveal reflex, Retinal pigment epithelial mottling, mild peripapillary pigmentation, No heme or edema   Vessels attenuated, Tortuous attenuated, Tortuous   Periphery Attached, no heme, RT/RD Attached             IMAGING AND PROCEDURES  Imaging and Procedures for @TODAY @  OCT, Retina - OU - Both  Eyes       Right Eye Quality was good. Central Foveal Thickness: 399. Progression has improved. Findings include abnormal foveal contour, intraretinal fluid, no SRF, intraretinal hyper-reflective material (Mild interval improvement in IRF; Partial PVD).   Left Eye Quality was good. Central Foveal Thickness: 331. Progression  has been stable. Findings include normal foveal contour, no IRF, no SRF (Partial PVD).   Notes *Images captured and stored on drive  Diagnosis / Impression:  OD: BRVO w/ Mild interval improvement in IRF; Partial PVD OS: NFP, no IRF/SRF  Clinical management:  See below  Abbreviations: NFP - Normal foveal profile. CME - cystoid macular edema. PED - pigment epithelial detachment. IRF - intraretinal fluid. SRF - subretinal fluid. EZ - ellipsoid zone. ERM - epiretinal membrane. ORA - outer retinal atrophy. ORT - outer retinal tubulation. SRHM - subretinal hyper-reflective material        Intravitreal Injection, Pharmacologic Agent - OD - Right Eye       Time Out 06/07/2020. 9:09 AM. Confirmed correct patient, procedure, site, and patient consented.   Anesthesia Topical anesthesia was used. Anesthetic medications included Lidocaine 2%, Proparacaine 0.5%.   Procedure Preparation included 5% betadine to ocular surface, eyelid speculum. A (32g) needle was used.   Injection:  2 mg aflibercept Gretta Cool) SOLN   NDC: L6038910, Lot: [id number], Expiration date: 09/21/2020   Route: Intravitreal, Site: Right Eye, Waste: 0.05 mL  Post-op Post injection exam found visual acuity of at least counting fingers. The patient tolerated the procedure well. There were no complications. The patient received written and verbal post procedure care education. Post injection medications were not given.                 ASSESSMENT/PLAN:    ICD-10-CM   1. Branch retinal vein occlusion of right eye with macular edema  H34.8310 Intravitreal Injection, Pharmacologic Agent - OD  - Right Eye    aflibercept (EYLEA) SOLN 2 mg  2. Retinal edema  H35.81 OCT, Retina - OU - Both Eyes  3. Essential hypertension  I10   4. Hypertensive retinopathy of both eyes  H35.033   5. Combined forms of age-related cataract of both eyes  H25.813   6. Posterior vitreous detachment of right eye  H43.811     1,2. BRVO with CME OD  - FA 5.14.21 shows +focal telangectasias with late leakage inferior macula consistent with remote inf BRVO  - S/P IVA OD #1 (05.14.21), #2 (06.14.21), #3 (07.16.21) -- IVA resistance  - s/p IVE OD #1 (08.18.21) -- sample, #2 (09.15.21), #3 (10.18.21), #4 (11.17.21)  - BCVA 20/20 OD (stable)  - OCT shows mild interval improvement in IRF -- good response to IVE  - recommend IVE OD #5 today, 12.15.21   - pt wishes to proceed  - RBA of procedure discussed, questions answered  - informed consent obtained  - Eylea informed consent form signed and scanned on 08.18.2021  - see procedure note  - [employer] benefits investigation started, 08.18.21 -- approved as of 09.15.21 -- secondary ins pays  - F/U 4 weeks -- DFE/OCT/possible injection  3,4. Hypertensive retinopathy OU  - discussed importance of tight BP control  - monitor   5. Mixed form cataracts OU  - The symptoms of cataract, surgical options, and treatments and risks were discussed with patient.  - discussed diagnosis and progression  - not yet visually significant  - monitor for now  6. Acute PVD / vitreous syneresis  - Discussed findings and prognosis  - No RT or RD on 360 scleral depressed exam  - Reviewed s/s of RT/RD  - Strict return precautions for any such RT/RD signs/symptoms  - f/u in 3-4 wks -- DFE/OCT   Ophthalmic Meds Ordered this visit:  Meds ordered this encounter  Medications  . aflibercept (  EYLEA) SOLN 2 mg       Return in about 4 weeks (around 07/05/2020) for f/u BRVO OD, DFE, OCT.  There are no Patient Instructions on file for this visit.  This document serves as a record  of services personally performed by Karie Chimera, MD, PhD. It was created on their behalf by Annalee Genta, COMT. The creation of this record is the provider's dictation and/or activities during the visit.  Electronically signed by: Annalee Genta, COMT 06/11/20 12:06 AM   This document serves as a record of services personally performed by Karie Chimera, MD, PhD. It was created on their behalf by Glee Arvin. Manson Passey, OA an ophthalmic technician. The creation of this record is the provider's dictation and/or activities during the visit.    Electronically signed by: Glee Arvin. Manson Passey, [place] 12.15.2021 12:06 AM  Karie Chimera, M.D., Ph.D. Diseases & Surgery of the Retina and Vitreous [facility]  I have reviewed the above documentation for accuracy and completeness, and I agree with the above. Karie Chimera, M.D., Ph.D. 06/11/20 12:06 AM     Abbreviations: M myopia (nearsighted); A astigmatism; H hyperopia (farsighted); P presbyopia; Mrx spectacle prescription;  CTL contact lenses; OD right eye; OS left eye; OU both eyes  XT exotropia; ET esotropia; PEK punctate epithelial keratitis; PEE punctate epithelial erosions; DES dry eye syndrome; MGD meibomian gland dysfunction; ATs artificial tears; PFAT's preservative free artificial tears; NSC nuclear sclerotic cataract; PSC posterior subcapsular cataract; ERM epi-retinal membrane; PVD posterior vitreous detachment; RD retinal detachment; DM diabetes mellitus; DR diabetic retinopathy; NPDR non-proliferative diabetic retinopathy; PDR proliferative diabetic retinopathy; CSME clinically significant macular edema; DME diabetic macular edema; dbh dot blot hemorrhages; CWS cotton wool spot; POAG primary open angle glaucoma; C/D cup-to-disc ratio; HVF humphrey visual field; GVF goldmann visual field; OCT optical coherence tomography; IOP intraocular pressure; BRVO Branch retinal vein occlusion; CRVO central retinal vein occlusion; CRAO central  retinal artery occlusion; BRAO branch retinal artery occlusion; RT retinal tear; SB scleral buckle; PPV pars plana vitrectomy; VH Vitreous hemorrhage; PRP panretinal laser photocoagulation; IVK intravitreal kenalog; VMT vitreomacular traction; MH Macular hole;  NVD neovascularization of the disc; NVE neovascularization elsewhere; AREDS age related eye disease study; ARMD age related macular degeneration; POAG primary open angle glaucoma; EBMD epithelial/anterior basement membrane dystrophy; ACIOL anterior chamber intraocular lens; IOL intraocular lens; PCIOL posterior chamber intraocular lens; Phaco/IOL phacoemulsification with intraocular lens placement; PRK photorefractive keratectomy; LASIK laser assisted in situ keratomileusis; HTN hypertension; DM diabetes mellitus; COPD chronic obstructive pulmonary disease

## 2020-06-07 ENCOUNTER — Other Ambulatory Visit: Payer: Self-pay

## 2020-06-07 ENCOUNTER — Ambulatory Visit (INDEPENDENT_AMBULATORY_CARE_PROVIDER_SITE_OTHER): Payer: Medicare Other | Admitting: Ophthalmology

## 2020-06-07 DIAGNOSIS — I10 Essential (primary) hypertension: Secondary | ICD-10-CM

## 2020-06-07 DIAGNOSIS — H34831 Tributary (branch) retinal vein occlusion, right eye, with macular edema: Secondary | ICD-10-CM

## 2020-06-07 DIAGNOSIS — H3581 Retinal edema: Secondary | ICD-10-CM | POA: Diagnosis not present

## 2020-06-07 DIAGNOSIS — H25813 Combined forms of age-related cataract, bilateral: Secondary | ICD-10-CM

## 2020-06-07 DIAGNOSIS — H35033 Hypertensive retinopathy, bilateral: Secondary | ICD-10-CM

## 2020-06-07 DIAGNOSIS — H43811 Vitreous degeneration, right eye: Secondary | ICD-10-CM

## 2020-06-10 ENCOUNTER — Encounter (INDEPENDENT_AMBULATORY_CARE_PROVIDER_SITE_OTHER): Payer: Self-pay | Admitting: Ophthalmology

## 2020-06-10 DIAGNOSIS — H34831 Tributary (branch) retinal vein occlusion, right eye, with macular edema: Secondary | ICD-10-CM

## 2020-06-10 MED ORDER — AFLIBERCEPT 2MG/0.05ML IZ SOLN FOR KALEIDOSCOPE
2.0000 mg | INTRAVITREAL | Status: AC | PRN
  Administered 2020-06-10: 2 mg via INTRAVITREAL

## 2020-06-29 NOTE — Progress Notes (Signed)
[facility] - Clinic Note  07/07/2020     CHIEF COMPLAINT Patient presents for Retina Follow Up   HISTORY OF PRESENT ILLNESS: [name] [name] is a 75 y.o. male who presents to the clinic today for:   HPI    Retina Follow Up    Patient presents with  CRVO/BRVO.  In right eye.  This started 4 weeks ago.  I, the attending physician,  performed the HPI with the patient and updated documentation appropriately.          Comments    Patient here for 4 weeks retina follow up for BRVO OD.  Patient states vision doing fine. No eye pain.        Last edited by [name], MD on 07/07/2020 11:54 AM. (History)    pt states    Referring physician: Margot Ables, MD [address] [place],  [place] [address]  HISTORICAL INFORMATION:   Selected notes from the MEDICAL RECORD NUMBER Referred by [name] for concern of vein occlusion OD LEE:  Ocular Hx- PMH-   CURRENT MEDICATIONS: No current outpatient medications on file. (Ophthalmic Drugs)   No current facility-administered medications for this visit. (Ophthalmic Drugs)   Current Outpatient Medications (Other)  Medication Sig   acetaminophen (TYLENOL) 500 MG tablet Take 500-1,000 mg by mouth every 6 (six) hours as needed for moderate pain or headache.   Cyanocobalamin (B-12 PO) Take 1 capsule by mouth daily.   finasteride (PROSCAR) 5 MG tablet Take 5 mg by mouth daily.   hydrOXYzine (ATARAX/VISTARIL) 25 MG tablet Take 25 mg by mouth 2 (two) times daily.   levothyroxine (SYNTHROID, LEVOTHROID) 100 MCG tablet TAKE 1 TABLET BY MOUTH ONCE DAILY   MELATONIN PO Take 1 tablet by mouth at bedtime.   Multiple Vitamin (MULTIVITAMIN WITH MINERALS) TABS tablet Take 1 tablet by mouth daily.   pravastatin (PRAVACHOL) 40 MG tablet TAKE 1 TABLET BY MOUTH EVERY DAY   sertraline (ZOLOFT) 100 MG tablet Take 1.5 tablets (150 mg total) by mouth daily.   tamsulosin (FLOMAX) 0.4 MG CAPS capsule Take  by mouth.   VITAMIN D PO Take 1 capsule by mouth daily.   No current facility-administered medications for this visit. (Other)      REVIEW OF SYSTEMS: ROS    Positive for: Endocrine, Cardiovascular, Eyes, Heme/Lymph   Negative for: Constitutional, Gastrointestinal, Neurological, Skin, Genitourinary, Musculoskeletal, HENT, Respiratory, Psychiatric, Allergic/Imm   Last edited by Laddie Aquas, COA on 07/07/2020  8:15 AM. (History)       ALLERGIES Allergies  Allergen Reactions   Egg White (Diagnostic)     Showed up on allergy test   Penicillins     Did it involve swelling of the face/tongue/throat, SOB, or low BP? No Did it involve sudden or severe rash/hives, skin peeling, or any reaction on the inside of your mouth or nose? No Did you need to seek medical attention at a hospital or doctor's office? Unknown When did it last happen?Childhood allergy If all above answers are NO, may proceed with cephalosporin use.    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   Cataract    Mixed form OU   History of skin cancer 04/29/2017   History of  skin cancer 04/29/2017   Hypertensive retinopathy    OU   Hypothyroidism 04/29/2017   Itching 04/29/2017   Scabies 05/28/2017   Past Surgical History:  Procedure Laterality Date   PPM GENERATOR CHANGEOUT N/A 06/14/2019   Procedure: PPM GENERATOR CHANGEOUT;  Surgeon: [name], MD;  Location: [facility]VASIVE [facility];  Service: Cardiovascular;  Laterality: N/A;    FAMILY HISTORY Family History  Problem Relation Age of Onset   Hypertension Brother     SOCIAL HISTORY Social History   Tobacco Use   Smoking status: Never Smoker   Smokeless tobacco: Never Used  Substance Use Topics   Alcohol use: Yes   Drug use: No          OPHTHALMIC EXAM:  Base Eye Exam    Visual Acuity (Snellen - Linear)      Right Left   Dist [place] 20/20 -1 20/25 -2   Dist ph [place]  20/20 -2       Tonometry (Tonopen, 8:12 AM)      Right Left   Pressure 17 17       Pupils      Dark Light Shape React APD   Right 3 2 Round Brisk None   Left 3 2 Round Brisk None       Visual Fields (Counting fingers)      Left Right    Full Full       Extraocular Movement      Right Left    Full Full       Neuro/Psych    Oriented x3: Yes   Mood/Affect: Normal       Dilation    Both eyes: 1.0% Mydriacyl, 2.5% Phenylephrine @ 8:12 AM        Slit Lamp and Fundus Exam    Slit Lamp Exam      Right Left   Lids/Lashes Dermatochalasis - upper lid, Meibomian gland dysfunction, inflammed Hordeolum - lower lid - improving Dermatochalasis - upper lid, Meibomian gland dysfunction   Conjunctiva/Sclera White and quiet White and quiet   Cornea trace Punctate epithelial erosions Trace inferior Punctate epithelial erosions, mild EBMD   Anterior Chamber Deep and quiet Deep and quiet   Iris Round and dilated Round and dilated   Lens 2-3+ Nuclear sclerosis, 2-3+ Cortical cataract 2-3+ Nuclear sclerosis, 2-3+ Cortical cataract   Vitreous Vitreous syneresis, Posterior vitreous detachment, vitreous condensations Vitreous syneresis       Fundus Exam      Right Left   Disc Pink and Sharp Pink and Sharp, focal PPP   C/D Ratio 0.5 0.6   Macula Blunted foveal reflex, focal IRH and edema IT to fovea -- slightly improved Flat, Good foveal reflex, Retinal pigment epithelial mottling, mild peripapillary pigmentation, No heme or edema   Vessels attenuated, mild tortuousity attenuated, Tortuous   Periphery Attached, no heme, RT/RD Attached             IMAGING AND PROCEDURES  Imaging and Procedures for @TODAY @  CUP PACEART REMOTE DEVICE CHECK     Component Value Flag Ref Range Units Status   Date Time Interrogation Session [id number]        Final    Pulse Generator Manufacturer [facility]        Final   Pulse Gen Model 2272 Assurity MRI        Final   Pulse Gen Serial Number [id number]        Final   Clinic Name [facility]  Final   Implantable Pulse Generator Type Implantable Pulse Generator        Final   Implantable Pulse Generator Implant Date [id number]        Final   Implantable Lead Manufacturer [facility]        Final   Implantable Lead Model 1948T IsoFlex        Final   Implantable Lead Serial Number [id number]        Final   Implantable Lead Implant Date [id number]        Final   Implantable Lead Location Detail 1 UNKNOWN        Final   Implantable Lead Location [id number]        Final   Implantable Lead Manufacturer [facility]        Final   Implantable Lead Model 2088TC Tendril STS        Final   Implantable Lead Serial Number [id number]        Final   Implantable Lead Implant Date [id number]        Final   Implantable Lead Location Detail 1 UNKNOWN        Final   Implantable Lead Location [phone]        Final   Lead Channel Setting Sensing Sensitivity 4.0       mV Final   Lead Channel Setting Sensing Adaptation Mode Fixed Pacing        Final   Lead Channel Setting Pacing Amplitude 1.75       V Final   Lead Channel Setting Pacing Pulse Width 0.5       ms Final   Lead Channel Setting Pacing Amplitude 0.875        Final   Lead Channel Status         Final   Lead Channel Impedance Value 440       ohm Final   Lead Channel Sensing Intrinsic Amplitude 2.7       mV Final   Lead Channel Pacing Threshold Amplitude 0.75       V Final   Lead Channel Pacing Threshold Pulse Width 0.5       ms Final   Lead Channel Status         Final   Lead Channel Impedance Value 540       ohm Final   Lead Channel Sensing Intrinsic Amplitude 11.4       mV Final   Lead Channel Pacing Threshold Amplitude 0.625       V Final   Lead Channel Pacing Threshold Pulse Width 0.5       ms Final   Battery Status MOS        Final   Battery Remaining Longevity 114       mo  Final   Battery Remaining Percentage 95.5       % Final   Battery Voltage 3.02       V Final   Brady Statistic RA Percent Paced 43.0       % Final   Brady Statistic RV Percent Paced 99.0       % Final   Brady Statistic AP VP Percent 43.0       % Final   Brady Statistic AS VP Percent 57.0       % Final   Brady Statistic AP VS Percent 1.0       % Final   Brady Statistic AS VS Percent 1.0       %  Final         Linked Images      OCT, Retina - OU - Both Eyes       Right Eye Quality was good. Central Foveal Thickness: 381. Progression has improved. Findings include abnormal foveal contour, intraretinal fluid, no SRF, intraretinal hyper-reflective material (Mild interval decrease in IRF; Partial PVD).   Left Eye Quality was good. Central Foveal Thickness: 329. Progression has been stable. Findings include normal foveal contour, no IRF, no SRF (Partial PVD).   Notes *Images captured and stored on drive  Diagnosis / Impression:  OD: BRVO w/ Mild interval decrease in IRF; Partial PVD OS: NFP, no IRF/SRF  Clinical management:  See below  Abbreviations: NFP - Normal foveal profile. CME - cystoid macular edema. PED - pigment epithelial detachment. IRF - intraretinal fluid. SRF - subretinal fluid. EZ - ellipsoid zone. ERM - epiretinal membrane. ORA - outer retinal atrophy. ORT - outer retinal tubulation. SRHM - subretinal hyper-reflective material        Intravitreal Injection, Pharmacologic Agent - OD - Right Eye       Time Out 07/07/2020. 8:41 AM. Confirmed correct patient, procedure, site, and patient consented.   Anesthesia Topical anesthesia was used. Anesthetic medications included Lidocaine 2%, Proparacaine 0.5%.   Procedure Preparation included 5% betadine to ocular surface, eyelid speculum. A (32g) needle was used.   Injection:  2 mg aflibercept Gretta Cool) SOLN   NDC: L6038910, Lot: [id number], Expiration date: 10/21/2020   Route: Intravitreal, Site: Right Eye,  Waste: 0.05 mL  Post-op Post injection exam found visual acuity of at least counting fingers. The patient tolerated the procedure well. There were no complications. The patient received written and verbal post procedure care education. Post injection medications were not given.                 ASSESSMENT/PLAN:    ICD-10-CM   1. Branch retinal vein occlusion of right eye with macular edema  H34.8310 Intravitreal Injection, Pharmacologic Agent - OD - Right Eye    aflibercept (EYLEA) SOLN 2 mg  2. Retinal edema  H35.81 OCT, Retina - OU - Both Eyes  3. Essential hypertension  I10   4. Hypertensive retinopathy of both eyes  H35.033   5. Combined forms of age-related cataract of both eyes  H25.813   6. Posterior vitreous detachment of right eye  H43.811     1,2. BRVO with CME OD  - FA 5.14.21 shows +focal telangectasias with late leakage inferior macula consistent with remote inf BRVO  - S/P IVA OD #1 (05.14.21), #2 (06.14.21), #3 (07.16.21) -- IVA resistance  - s/p IVE OD #1 (08.18.21) -- sample, #2 (09.15.21), #3 (10.18.21), #4 (11.17.21), #5 (12.15.21)  - BCVA 20/20 OD (stable)  - OCT shows mild interval improvement in IRF -- good response to IVE  - recommend IVE OD #6 today, 01.14.22   - pt wishes to proceed  - RBA of procedure discussed, questions answered  - informed consent obtained  - Eylea informed consent form signed and scanned on 08.18.2021  - see procedure note  - [email or web address] benefits investigation started, 08.18.21 -- approved as of 09.15.21 -- secondary ins pays -- verified for 2022  - F/U 4 weeks -- DFE/OCT/possible injection  3,4. Hypertensive retinopathy OU  - discussed importance of tight BP control  - monitor   5. Mixed form cataracts OU  - The symptoms of cataract, surgical options, and treatments and risks were discussed with patient.  - discussed diagnosis  and progression  - not yet visually significant  - monitor for now  6. Acute PVD / vitreous  syneresis  - Discussed findings and prognosis  - No RT or RD on 360 scleral depressed exam  - Reviewed s/s of RT/RD  - Strict return precautions for any such RT/RD signs/symptoms  - f/u in 3-4 wks -- DFE/OCT   Ophthalmic Meds Ordered this visit:  Meds ordered this encounter  Medications   aflibercept (EYLEA) SOLN 2 mg       Return in about 4 weeks (around 08/04/2020) for f/u BRVO OD, DFE, OCT.  There are no Patient Instructions on file for this visit.  This document serves as a record of services personally performed by Karie Chimera, MD, PhD. It was created on their behalf by Glee Arvin. Manson Passey, OA an ophthalmic technician. The creation of this record is the provider's dictation and/or activities during the visit.    Electronically signed by: Glee Arvin. Manson Passey, [place] 01.06.2022 11:56 AM  Karie Chimera, M.D., Ph.D. Diseases & Surgery of the Retina and Vitreous [facility]  I have reviewed the above documentation for accuracy and completeness, and I agree with the above. Karie Chimera, M.D., Ph.D. 07/07/20 11:57 AM   Abbreviations: M myopia (nearsighted); A astigmatism; H hyperopia (farsighted); P presbyopia; Mrx spectacle prescription;  CTL contact lenses; OD right eye; OS left eye; OU both eyes  XT exotropia; ET esotropia; PEK punctate epithelial keratitis; PEE punctate epithelial erosions; DES dry eye syndrome; MGD meibomian gland dysfunction; ATs artificial tears; PFAT's preservative free artificial tears; NSC nuclear sclerotic cataract; PSC posterior subcapsular cataract; ERM epi-retinal membrane; PVD posterior vitreous detachment; RD retinal detachment; DM diabetes mellitus; DR diabetic retinopathy; NPDR non-proliferative diabetic retinopathy; PDR proliferative diabetic retinopathy; CSME clinically significant macular edema; DME diabetic macular edema; dbh dot blot hemorrhages; CWS cotton wool spot; POAG primary open angle glaucoma; C/D cup-to-disc ratio; HVF  humphrey visual field; GVF goldmann visual field; OCT optical coherence tomography; IOP intraocular pressure; BRVO Branch retinal vein occlusion; CRVO central retinal vein occlusion; CRAO central retinal artery occlusion; BRAO branch retinal artery occlusion; RT retinal tear; SB scleral buckle; PPV pars plana vitrectomy; VH Vitreous hemorrhage; PRP panretinal laser photocoagulation; IVK intravitreal kenalog; VMT vitreomacular traction; MH Macular hole;  NVD neovascularization of the disc; NVE neovascularization elsewhere; AREDS age related eye disease study; ARMD age related macular degeneration; POAG primary open angle glaucoma; EBMD epithelial/anterior basement membrane dystrophy; ACIOL anterior chamber intraocular lens; IOL intraocular lens; PCIOL posterior chamber intraocular lens; Phaco/IOL phacoemulsification with intraocular lens placement; PRK photorefractive keratectomy; LASIK laser assisted in situ keratomileusis; HTN hypertension; DM diabetes mellitus; COPD chronic obstructive pulmonary disease

## 2020-07-07 ENCOUNTER — Ambulatory Visit (INDEPENDENT_AMBULATORY_CARE_PROVIDER_SITE_OTHER): Payer: Medicare Other

## 2020-07-07 ENCOUNTER — Other Ambulatory Visit: Payer: Self-pay

## 2020-07-07 ENCOUNTER — Encounter (INDEPENDENT_AMBULATORY_CARE_PROVIDER_SITE_OTHER): Payer: Self-pay | Admitting: Ophthalmology

## 2020-07-07 ENCOUNTER — Ambulatory Visit (INDEPENDENT_AMBULATORY_CARE_PROVIDER_SITE_OTHER): Payer: Medicare Other | Admitting: Ophthalmology

## 2020-07-07 DIAGNOSIS — H3581 Retinal edema: Secondary | ICD-10-CM

## 2020-07-07 DIAGNOSIS — H35033 Hypertensive retinopathy, bilateral: Secondary | ICD-10-CM | POA: Diagnosis not present

## 2020-07-07 DIAGNOSIS — H25813 Combined forms of age-related cataract, bilateral: Secondary | ICD-10-CM

## 2020-07-07 DIAGNOSIS — I442 Atrioventricular block, complete: Secondary | ICD-10-CM

## 2020-07-07 DIAGNOSIS — I10 Essential (primary) hypertension: Secondary | ICD-10-CM

## 2020-07-07 DIAGNOSIS — H34831 Tributary (branch) retinal vein occlusion, right eye, with macular edema: Secondary | ICD-10-CM | POA: Diagnosis not present

## 2020-07-07 DIAGNOSIS — H43811 Vitreous degeneration, right eye: Secondary | ICD-10-CM

## 2020-07-07 LAB — CUP PACEART REMOTE DEVICE CHECK
Battery Remaining Longevity: 114 mo
Battery Remaining Percentage: 95.5 %
Battery Voltage: 3.02 V
Brady Statistic AP VP Percent: 43 %
Brady Statistic AP VS Percent: 1 %
Brady Statistic AS VP Percent: 57 %
Brady Statistic AS VS Percent: 1 %
Brady Statistic RA Percent Paced: 43 %
Brady Statistic RV Percent Paced: 99 %
Date Time Interrogation Session: 20220114020013
Implantable Lead Implant Date: 20101229
Implantable Lead Implant Date: 20101229
Implantable Lead Location: 753859
Implantable Lead Location: 753860
Implantable Pulse Generator Implant Date: 20201221
Lead Channel Impedance Value: 440 Ohm
Lead Channel Impedance Value: 540 Ohm
Lead Channel Pacing Threshold Amplitude: 0.625 V
Lead Channel Pacing Threshold Amplitude: 0.75 V
Lead Channel Pacing Threshold Pulse Width: 0.5 ms
Lead Channel Pacing Threshold Pulse Width: 0.5 ms
Lead Channel Sensing Intrinsic Amplitude: 11.4 mV
Lead Channel Sensing Intrinsic Amplitude: 2.7 mV
Lead Channel Setting Pacing Amplitude: 0.875
Lead Channel Setting Pacing Amplitude: 1.75 V
Lead Channel Setting Pacing Pulse Width: 0.5 ms
Lead Channel Setting Sensing Sensitivity: 4 mV
Pulse Gen Model: 2272
Pulse Gen Serial Number: 9190527

## 2020-07-07 MED ORDER — AFLIBERCEPT 2MG/0.05ML IZ SOLN FOR KALEIDOSCOPE
2.0000 mg | INTRAVITREAL | Status: AC | PRN
  Administered 2020-07-07: 2 mg via INTRAVITREAL

## 2020-07-21 NOTE — Progress Notes (Signed)
Remote pacemaker transmission.   

## 2020-07-31 NOTE — Progress Notes (Signed)
[facility] - Clinic Note  08/04/2020     CHIEF COMPLAINT Patient presents for Retina Follow Up   HISTORY OF PRESENT ILLNESS: [name] [name] is a 75 y.o. male who presents to the clinic today for:   HPI    Retina Follow Up    Patient presents with  CRVO/BRVO.  In right eye.  This started 4 weeks ago.  I, the attending physician,  performed the HPI with the patient and updated documentation appropriately.          Comments    Patient here for 4 weeks retina follow up for BRVO OD. Patient states vision doing fine. No eye pain.        Last edited by [name], MD on 08/04/2020  9:21 AM. (History)    pt states    Referring physician: Margot Ables, MD [address] [place],  [place] [address]  HISTORICAL INFORMATION:   Selected notes from the MEDICAL RECORD NUMBER Referred by [name] for concern of vein occlusion OD LEE:  Ocular Hx- PMH-   CURRENT MEDICATIONS: No current outpatient medications on file. (Ophthalmic Drugs)   No current facility-administered medications for this visit. (Ophthalmic Drugs)   Current Outpatient Medications (Other)  Medication Sig  . acetaminophen (TYLENOL) 500 MG tablet Take 500-1,000 mg by mouth every 6 (six) hours as needed for moderate pain or headache.  . Cyanocobalamin (B-12 PO) Take 1 capsule by mouth daily.  . finasteride (PROSCAR) 5 MG tablet Take 5 mg by mouth daily.  . hydrOXYzine (ATARAX/VISTARIL) 25 MG tablet Take 25 mg by mouth 2 (two) times daily.  Marland Kitchen levothyroxine (SYNTHROID, LEVOTHROID) 100 MCG tablet TAKE 1 TABLET BY MOUTH ONCE DAILY  . MELATONIN PO Take 1 tablet by mouth at bedtime.  . Multiple Vitamin (MULTIVITAMIN WITH MINERALS) TABS tablet Take 1 tablet by mouth daily.  . pravastatin (PRAVACHOL) 40 MG tablet TAKE 1 TABLET BY MOUTH EVERY DAY  . sertraline (ZOLOFT) 100 MG tablet Take 1.5 tablets (150 mg total) by mouth daily.  . tamsulosin (FLOMAX) 0.4 MG CAPS capsule Take  by mouth.  Marland Kitchen VITAMIN D PO Take 1 capsule by mouth daily.   No current facility-administered medications for this visit. (Other)      REVIEW OF SYSTEMS: ROS    Positive for: Endocrine, Cardiovascular, Eyes, Heme/Lymph   Negative for: Constitutional, Gastrointestinal, Neurological, Skin, Genitourinary, Musculoskeletal, HENT, Respiratory, Psychiatric, Allergic/Imm   Last edited by Laddie Aquas, COA on 08/04/2020  8:36 AM. (History)       ALLERGIES Allergies  Allergen Reactions  . Egg White (Diagnostic)     Showed up on allergy test  . Penicillins     Did it involve swelling of the face/tongue/throat, SOB, or low BP? No Did it involve sudden or severe rash/hives, skin peeling, or any reaction on the inside of your mouth or nose? No Did you need to seek medical attention at a hospital or doctor's office? Unknown When did it last happen?Childhood allergy If all above answers are "NO", may proceed with cephalosporin use.   . Shrimp [Shellfish Allergy]     Showed up on allergy test  . Wheat Bran     Showed up on allergy test  . Sulfa Antibiotics Rash    PAST MEDICAL HISTORY Past Medical History:  Diagnosis Date  . 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 Cataract    Mixed form OU  . History of skin cancer 04/29/2017  . History of  skin cancer 04/29/2017  . Hypertensive retinopathy    OU  . Hypothyroidism 04/29/2017  . Itching 04/29/2017  . Scabies 05/28/2017   Past Surgical History:  Procedure Laterality Date  . PPM GENERATOR CHANGEOUT N/A 06/14/2019   Procedure: PPM GENERATOR CHANGEOUT;  Surgeon: [name], MD;  Location: [facility] INVASIVE CV LAB;  Service: Cardiovascular;  Laterality: N/A;    FAMILY HISTORY Family History  Problem Relation Age of Onset  . Hypertension Brother     SOCIAL HISTORY Social History   Tobacco Use  . Smoking status: Never Smoker  . Smokeless tobacco: Never Used  Substance Use Topics  . Alcohol use: Yes  . Drug use: No          OPHTHALMIC EXAM:  Base Eye Exam    Visual Acuity (Snellen - Linear)      Right Left   Dist [place] 20/20 -1 20/40   Dist ph   20/20 -1       Tonometry (Tonopen, 8:33 AM)      Right Left   Pressure 20 16       Pupils      Dark Light Shape React APD   Right 3 2 Round Brisk None   Left 3 2 Round Brisk None       Visual Fields (Counting fingers)      Left Right    Full Full       Extraocular Movement      Right Left    Full, Ortho Full, Ortho       Neuro/Psych    Oriented x3: Yes   Mood/Affect: Normal       Dilation    Both eyes: 1.0% Mydriacyl, 2.5% Phenylephrine @ 8:33 AM        Slit Lamp and Fundus Exam    Slit Lamp Exam      Right Left   Lids/Lashes Dermatochalasis - upper lid, Meibomian gland dysfunction, inflammed Hordeolum - lower lid - improving Dermatochalasis - upper lid, Meibomian gland dysfunction   Conjunctiva/Sclera White and quiet White and quiet   Cornea trace Punctate epithelial erosions Trace inferior Punctate epithelial erosions, mild EBMD   Anterior Chamber Deep and quiet Deep and quiet   Iris Round and dilated Round and dilated   Lens 2-3+ Nuclear sclerosis, 2-3+ Cortical cataract 2-3+ Nuclear sclerosis, 2-3+ Cortical cataract   Vitreous Vitreous syneresis, Posterior vitreous detachment, vitreous condensations Vitreous syneresis       Fundus Exam      Right Left   Disc Pink and Sharp Pink and Sharp, focal PPP   C/D Ratio 0.5 0.6   Macula Blunted foveal reflex, focal IRH and edema IT to fovea -- slightly improved Flat, Good foveal reflex, Retinal pigment epithelial mottling, No heme or edema   Vessels attenuated, mild tortuousity attenuated, Tortuous   Periphery Attached, no heme, RT/RD Attached             IMAGING AND PROCEDURES  Imaging and Procedures for @TODAY @  OCT, Retina - OU - Both Eyes       Right Eye Quality was good. Central Foveal Thickness: 369. Progression has improved. Findings include abnormal foveal  contour, intraretinal fluid, no SRF, intraretinal hyper-reflective material (Mild interval improvement in IRF; Partial PVD).   Left Eye Quality was good. Central Foveal Thickness: 326. Progression has been stable. Findings include normal foveal contour, no IRF, no SRF (Partial PVD).   Notes *Images captured and stored on drive  Diagnosis / Impression:  OD:  BRVO w/ Mild interval improvement in IRF; Partial PVD OS: NFP, no IRF/SRF  Clinical management:  See below  Abbreviations: NFP - Normal foveal profile. CME - cystoid macular edema. PED - pigment epithelial detachment. IRF - intraretinal fluid. SRF - subretinal fluid. EZ - ellipsoid zone. ERM - epiretinal membrane. ORA - outer retinal atrophy. ORT - outer retinal tubulation. SRHM - subretinal hyper-reflective material        Intravitreal Injection, Pharmacologic Agent - OD - Right Eye       Time Out 08/04/2020. 8:58 AM. Confirmed correct patient, procedure, site, and patient consented.   Anesthesia Topical anesthesia was used. Anesthetic medications included Lidocaine 2%, Proparacaine 0.5%.   Procedure Preparation included 5% betadine to ocular surface, eyelid speculum. A (32g) needle was used.   Injection:  2 mg aflibercept Gretta Cool) SOLN   NDC: L6038910, Lot: [id number], Expiration date: 11/21/2020   Route: Intravitreal, Site: Right Eye, Waste: 0.05 mL  Post-op Post injection exam found visual acuity of at least counting fingers. The patient tolerated the procedure well. There were no complications. The patient received written and verbal post procedure care education. Post injection medications were not given.                 ASSESSMENT/PLAN:    ICD-10-CM   1. Branch retinal vein occlusion of right eye with macular edema  H34.8310 Intravitreal Injection, Pharmacologic Agent - OD - Right Eye    aflibercept (EYLEA) SOLN 2 mg  2. Retinal edema  H35.81 OCT, Retina - OU - Both Eyes  3. Essential hypertension  I10    4. Hypertensive retinopathy of both eyes  H35.033   5. Combined forms of age-related cataract of both eyes  H25.813   6. Posterior vitreous detachment of right eye  H43.811     1,2. BRVO with CME OD  - FA 5.14.21 shows +focal telangectasias with late leakage inferior macula consistent with remote inf BRVO  - S/P IVA OD #1 (05.14.21), #2 (06.14.21), #3 (07.16.21) -- IVA resistance  - s/p IVE OD #1 (08.18.21) -- sample, #2 (09.15.21), #3 (10.18.21), #4 (11.17.21), #5 (12.15.21), #6 (01.14.22)  - BCVA 20/20 OD (stable)  - OCT shows mild interval improvement in IRF -- good response to IVE  - recommend IVE OD #7 today, 02.11.22 w/ ext to 5 wks  - pt wishes to proceed  - RBA of procedure discussed, questions answered  - informed consent obtained  - Eylea informed consent form signed and scanned on 08.18.2021  - see procedure note  - [employer] benefits investigation started, 08.18.21 -- approved as of 09.15.21 -- secondary ins pays -- verified for 2022  - F/U 5 weeks -- DFE/OCT/possible injection  3,4. Hypertensive retinopathy OU  - discussed importance of tight BP control  - monitor   5. Mixed form cataracts OU  - The symptoms of cataract, surgical options, and treatments and risks were discussed with patient.  - discussed diagnosis and progression  - not yet visually significant  - monitor for now  6. Acute PVD / vitreous syneresis  - Discussed findings and prognosis  - No RT or RD on 360 scleral depressed exam  - Reviewed s/s of RT/RD  - Strict return precautions for any such RT/RD signs/symptoms  - monitor   Ophthalmic Meds Ordered this visit:  Meds ordered this encounter  Medications  . aflibercept (EYLEA) SOLN 2 mg       Return in about 5 weeks (around 09/08/2020) for f/u BRVO OD, DFE,  OCT.  There are no Patient Instructions on file for this visit.  This document serves as a record of services personally performed by Karie Chimera, MD, PhD. It was created on their  behalf by Glee Arvin. Manson Passey, OA an ophthalmic technician. The creation of this record is the provider's dictation and/or activities during the visit.    Electronically signed by: Glee Arvin. Manson Passey, [place] 02.07.2022 9:23 AM  Karie Chimera, M.D., Ph.D. Diseases & Surgery of the Retina and Vitreous [facility]  I have reviewed the above documentation for accuracy and completeness, and I agree with the above. Karie Chimera, M.D., Ph.D. 08/04/20 9:23 AM   Abbreviations: M myopia (nearsighted); A astigmatism; H hyperopia (farsighted); P presbyopia; Mrx spectacle prescription;  CTL contact lenses; OD right eye; OS left eye; OU both eyes  XT exotropia; ET esotropia; PEK punctate epithelial keratitis; PEE punctate epithelial erosions; DES dry eye syndrome; MGD meibomian gland dysfunction; ATs artificial tears; PFAT's preservative free artificial tears; NSC nuclear sclerotic cataract; PSC posterior subcapsular cataract; ERM epi-retinal membrane; PVD posterior vitreous detachment; RD retinal detachment; DM diabetes mellitus; DR diabetic retinopathy; NPDR non-proliferative diabetic retinopathy; PDR proliferative diabetic retinopathy; CSME clinically significant macular edema; DME diabetic macular edema; dbh dot blot hemorrhages; CWS cotton wool spot; POAG primary open angle glaucoma; C/D cup-to-disc ratio; HVF humphrey visual field; GVF goldmann visual field; OCT optical coherence tomography; IOP intraocular pressure; BRVO Branch retinal vein occlusion; CRVO central retinal vein occlusion; CRAO central retinal artery occlusion; BRAO branch retinal artery occlusion; RT retinal tear; SB scleral buckle; PPV pars plana vitrectomy; VH Vitreous hemorrhage; PRP panretinal laser photocoagulation; IVK intravitreal kenalog; VMT vitreomacular traction; MH Macular hole;  NVD neovascularization of the disc; NVE neovascularization elsewhere; AREDS age related eye disease study; ARMD age related macular  degeneration; POAG primary open angle glaucoma; EBMD epithelial/anterior basement membrane dystrophy; ACIOL anterior chamber intraocular lens; IOL intraocular lens; PCIOL posterior chamber intraocular lens; Phaco/IOL phacoemulsification with intraocular lens placement; PRK photorefractive keratectomy; LASIK laser assisted in situ keratomileusis; HTN hypertension; DM diabetes mellitus; COPD chronic obstructive pulmonary disease

## 2020-08-04 ENCOUNTER — Ambulatory Visit (INDEPENDENT_AMBULATORY_CARE_PROVIDER_SITE_OTHER): Payer: Medicare Other | Admitting: Ophthalmology

## 2020-08-04 ENCOUNTER — Encounter (INDEPENDENT_AMBULATORY_CARE_PROVIDER_SITE_OTHER): Payer: Self-pay | Admitting: Ophthalmology

## 2020-08-04 ENCOUNTER — Other Ambulatory Visit: Payer: Self-pay

## 2020-08-04 DIAGNOSIS — H35033 Hypertensive retinopathy, bilateral: Secondary | ICD-10-CM

## 2020-08-04 DIAGNOSIS — H43811 Vitreous degeneration, right eye: Secondary | ICD-10-CM

## 2020-08-04 DIAGNOSIS — H34831 Tributary (branch) retinal vein occlusion, right eye, with macular edema: Secondary | ICD-10-CM | POA: Diagnosis not present

## 2020-08-04 DIAGNOSIS — I10 Essential (primary) hypertension: Secondary | ICD-10-CM | POA: Diagnosis not present

## 2020-08-04 DIAGNOSIS — H3581 Retinal edema: Secondary | ICD-10-CM | POA: Diagnosis not present

## 2020-08-04 DIAGNOSIS — H25813 Combined forms of age-related cataract, bilateral: Secondary | ICD-10-CM

## 2020-08-04 MED ORDER — AFLIBERCEPT 2MG/0.05ML IZ SOLN FOR KALEIDOSCOPE
2.0000 mg | INTRAVITREAL | Status: AC | PRN
  Administered 2020-08-04: 2 mg via INTRAVITREAL

## 2020-09-06 NOTE — Progress Notes (Addendum)
[facility] - Clinic Note  09/08/2020     CHIEF COMPLAINT Patient presents for Retina Follow Up   HISTORY OF PRESENT ILLNESS: [name] [name] is a 75 y.o. male who presents to the clinic today for:   HPI    Retina Follow Up    Patient presents with  CRVO/BRVO.  In right eye.  This started 5 weeks ago.  I, the attending physician,  performed the HPI with the patient and updated documentation appropriately.          Comments    Patient here for 5 weeks retina follow up for BRVO OD. Patient states vision doing fine. No eye pain.        Last edited by [name], MD on 09/08/2020  1:27 PM. (History)    pt states vision is good   Referring physician: Margot Ables, MD [address] [place],  [place] [address]  HISTORICAL INFORMATION:   Selected notes from the MEDICAL RECORD NUMBER Referred by [name] for concern of vein occlusion OD LEE:  Ocular Hx- PMH-   CURRENT MEDICATIONS: No current outpatient medications on file. (Ophthalmic Drugs)   No current facility-administered medications for this visit. (Ophthalmic Drugs)   Current Outpatient Medications (Other)  Medication Sig  . acetaminophen (TYLENOL) 500 MG tablet Take 500-1,000 mg by mouth every 6 (six) hours as needed for moderate pain or headache.  . Cyanocobalamin (B-12 PO) Take 1 capsule by mouth daily.  . finasteride (PROSCAR) 5 MG tablet Take 5 mg by mouth daily.  . hydrOXYzine (ATARAX/VISTARIL) 25 MG tablet Take 25 mg by mouth 2 (two) times daily.  Marland Kitchen levothyroxine (SYNTHROID, LEVOTHROID) 100 MCG tablet TAKE 1 TABLET BY MOUTH ONCE DAILY  . MELATONIN PO Take 1 tablet by mouth at bedtime.  . Multiple Vitamin (MULTIVITAMIN WITH MINERALS) TABS tablet Take 1 tablet by mouth daily.  . pravastatin (PRAVACHOL) 40 MG tablet TAKE 1 TABLET BY MOUTH EVERY DAY  . sertraline (ZOLOFT) 100 MG tablet Take 1.5 tablets (150 mg total) by mouth daily.  . tamsulosin (FLOMAX) 0.4 MG CAPS  capsule Take by mouth.  Marland Kitchen VITAMIN D PO Take 1 capsule by mouth daily.   No current facility-administered medications for this visit. (Other)      REVIEW OF SYSTEMS: ROS    Positive for: Endocrine, Cardiovascular, Eyes, Heme/Lymph   Negative for: Constitutional, Gastrointestinal, Neurological, Skin, Genitourinary, Musculoskeletal, HENT, Respiratory, Psychiatric, Allergic/Imm   Last edited by Laddie Aquas, COA on 09/08/2020  8:59 AM. (History)       ALLERGIES Allergies  Allergen Reactions  . Egg White (Diagnostic)     Showed up on allergy test  . Penicillins     Did it involve swelling of the face/tongue/throat, SOB, or low BP? No Did it involve sudden or severe rash/hives, skin peeling, or any reaction on the inside of your mouth or nose? No Did you need to seek medical attention at a hospital or doctor's office? Unknown When did it last happen?Childhood allergy If all above answers are "NO", may proceed with cephalosporin use.   . Shrimp [Shellfish Allergy]     Showed up on allergy test  . Wheat Bran     Showed up on allergy test  . Sulfa Antibiotics Rash    PAST MEDICAL HISTORY Past Medical History:  Diagnosis Date  . 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 Cataract    Mixed form OU  . History of skin cancer 04/29/2017  .  History of skin cancer 04/29/2017  . Hypertensive retinopathy    OU  . Hypothyroidism 04/29/2017  . Itching 04/29/2017  . Scabies 05/28/2017   Past Surgical History:  Procedure Laterality Date  . PPM GENERATOR CHANGEOUT N/A 06/14/2019   Procedure: PPM GENERATOR CHANGEOUT;  Surgeon: Marinus Mawaylor, Gregg W, MD;  [facility];  Service: Cardiovascular;  Laterality: N/A;    FAMILY HISTORY Family History  Problem Relation Age of Onset  . Hypertension Brother     SOCIAL HISTORY Social History   Tobacco Use  . Smoking status: Never Smoker  . Smokeless tobacco: Never Used  Substance Use Topics  . Alcohol use: Yes  . Drug  use: No         OPHTHALMIC EXAM:  Base Eye Exam    Visual Acuity (Snellen - Linear)      Right Left   Dist [place] 20/20 20/30 -2   Dist ph [place]  20/20       Tonometry (Tonopen, 8:57 AM)      Right Left   Pressure 16 12       Pupils      Dark Light Shape React APD   Right 3 2 Round Brisk None   Left 3 2 Round Brisk None       Visual Fields (Counting fingers)      Left Right    Full Full       Extraocular Movement      Right Left    Full, Ortho Full, Ortho       Neuro/Psych    Oriented x3: Yes   Mood/Affect: Normal       Dilation    Both eyes: 1.0% Mydriacyl, 2.5% Phenylephrine @ 8:57 AM        Slit Lamp and Fundus Exam    Slit Lamp Exam      Right Left   Lids/Lashes Dermatochalasis - upper lid, Meibomian gland dysfunction, inflammed Hordeolum - lower lid - improving Dermatochalasis - upper lid, Meibomian gland dysfunction   Conjunctiva/Sclera White and quiet White and quiet   Cornea trace Punctate epithelial erosions Trace inferior Punctate epithelial erosions, mild EBMD   Anterior Chamber Deep and quiet Deep and quiet   Iris Round and dilated Round and dilated   Lens 2-3+ Nuclear sclerosis, 2-3+ Cortical cataract 2-3+ Nuclear sclerosis, 2-3+ Cortical cataract   Vitreous Vitreous syneresis, Posterior vitreous detachment, vitreous condensations Vitreous syneresis, Posterior vitreous detachment       Fundus Exam      Right Left   Disc Pink and Sharp Pink and Sharp, focal PPP   C/D Ratio 0.5 0.6   Macula Blunted foveal reflex, focal IRH and edema IT to fovea -- slightly increased Flat, Good foveal reflex, Retinal pigment epithelial mottling, No heme or edema   Vessels attenuated, mild tortuousity attenuated, Tortuous   Periphery Attached, no heme, RT/RD Attached             IMAGING AND PROCEDURES  Imaging and Procedures for @TODAY @  OCT, Retina - OU - Both Eyes       Right Eye Quality was good. Central Foveal Thickness: 384. Progression has  worsened. Findings include abnormal foveal contour, intraretinal fluid, no SRF, intraretinal hyper-reflective material (persistent IRF; Partial PVD).   Left Eye Quality was good. Central Foveal Thickness: 328. Progression has been stable. Findings include normal foveal contour, no IRF, no SRF (Partial PVD).   Notes *Images captured and stored on drive  Diagnosis / Impression:  OD: BRVO w/ persistent IRF; Partial PVD OS: NFP, no IRF/SRF  Clinical management:  See below  Abbreviations: NFP - Normal foveal profile. CME - cystoid macular edema. PED - pigment epithelial detachment. IRF - intraretinal fluid. SRF - subretinal fluid. EZ - ellipsoid zone. ERM - epiretinal membrane. ORA - outer retinal atrophy. ORT - outer retinal tubulation. SRHM - subretinal hyper-reflective material        Intravitreal Injection, Pharmacologic Agent - OD - Right Eye       Time Out 09/08/2020. 9:51 AM. Confirmed correct patient, procedure, site, and patient consented.   Anesthesia Topical anesthesia was used. Anesthetic medications included Lidocaine 2%, Proparacaine 0.5%.   Procedure Preparation included 5% betadine to ocular surface, eyelid speculum. A (32g) needle was used.   Injection:  2 mg aflibercept Gretta Cool) SOLN   NDC: L6038910, Lot: [id number], Expiration date: 03/23/2021   Route: Intravitreal, Site: Right Eye, Waste: 0.05 mL  Post-op Post injection exam found visual acuity of at least counting fingers. The patient tolerated the procedure well. There were no complications. The patient received written and verbal post procedure care education. Post injection medications were not given.                 ASSESSMENT/PLAN:    ICD-10-CM   1. Branch retinal vein occlusion of right eye with macular edema  H34.8310 Intravitreal Injection, Pharmacologic Agent - OD - Right Eye    aflibercept (EYLEA) SOLN 2 mg  2. Retinal edema  H35.81 OCT, Retina - OU - Both Eyes  3. Essential  hypertension  I10   4. Hypertensive retinopathy of both eyes  H35.033   5. Combined forms of age-related cataract of both eyes  H25.813   6. Posterior vitreous detachment of right eye  H43.811     1,2. BRVO with CME OD  - FA 5.14.21 shows +focal telangectasias with late leakage inferior macula consistent with remote inf BRVO  - S/P IVA OD #1 (05.14.21), #2 (06.14.21), #3 (07.16.21) -- IVA resistance  - s/p IVE OD #1 (08.18.21) -- sample, #2 (09.15.21), #3 (10.18.21), #4 (11.17.21), #5 (12.15.21), #6 (01.14.22), #7 (02.11.22)  - BCVA 20/20 OD (stable)  - OCT shows persistent IRF -- slight increase in IRF at 5 weeks  - recommend IVE OD #8 today, 03.18.22 w/ return in 4 wks  - pt wishes to proceed  - RBA of procedure discussed, questions answered  - informed consent obtained  - Eylea informed consent form signed and scanned on 08.18.2021  - see procedure note  - [employer] benefits investigation started, 08.18.21 -- approved as of 09.15.21 -- secondary ins pays -- verified for 2022  - F/U 4 weeks -- DFE/OCT/possible injection  3,4. Hypertensive retinopathy OU  - discussed importance of tight BP control  - monitor   5. Mixed form cataracts OU  - The symptoms of cataract, surgical options, and treatments and risks were discussed with patient.  - discussed diagnosis and progression  - not yet visually significant  - monitor for now  6. Acute PVD / vitreous syneresis  - Discussed findings and prognosis  - No RT or RD on 360 scleral depressed exam  - Reviewed s/s of RT/RD  - Strict return precautions for any such RT/RD signs/symptoms  - monitor   Ophthalmic Meds Ordered this visit:  Meds ordered this encounter  Medications  . aflibercept (EYLEA) SOLN 2 mg       Return in about 4 weeks (around 10/06/2020) for f/u BRVO OD, DFE,  OCT.  There are no Patient Instructions on file for this visit.  This document serves as a record of services personally performed by Karie Chimera,  MD, PhD. It was created on their behalf by Glee Arvin. Manson Passey, OA an ophthalmic technician. The creation of this record is the provider's dictation and/or activities during the visit.    Electronically signed by: Glee Arvin. Manson Passey, [place] 03.16.2022 1:29 PM  Karie Chimera, M.D., Ph.D. Diseases & Surgery of the Retina and Vitreous [facility]  I have reviewed the above documentation for accuracy and completeness, and I agree with the above. Karie Chimera, M.D., Ph.D. 09/08/20 1:29 PM  Abbreviations: M myopia (nearsighted); A astigmatism; H hyperopia (farsighted); P presbyopia; Mrx spectacle prescription;  CTL contact lenses; OD right eye; OS left eye; OU both eyes  XT exotropia; ET esotropia; PEK punctate epithelial keratitis; PEE punctate epithelial erosions; DES dry eye syndrome; MGD meibomian gland dysfunction; ATs artificial tears; PFAT's preservative free artificial tears; NSC nuclear sclerotic cataract; PSC posterior subcapsular cataract; ERM epi-retinal membrane; PVD posterior vitreous detachment; RD retinal detachment; DM diabetes mellitus; DR diabetic retinopathy; NPDR non-proliferative diabetic retinopathy; PDR proliferative diabetic retinopathy; CSME clinically significant macular edema; DME diabetic macular edema; dbh dot blot hemorrhages; CWS cotton wool spot; POAG primary open angle glaucoma; C/D cup-to-disc ratio; HVF humphrey visual field; GVF goldmann visual field; OCT optical coherence tomography; IOP intraocular pressure; BRVO Branch retinal vein occlusion; CRVO central retinal vein occlusion; CRAO central retinal artery occlusion; BRAO branch retinal artery occlusion; RT retinal tear; SB scleral buckle; PPV pars plana vitrectomy; VH Vitreous hemorrhage; PRP panretinal laser photocoagulation; IVK intravitreal kenalog; VMT vitreomacular traction; MH Macular hole;  NVD neovascularization of the disc; NVE neovascularization elsewhere; AREDS age related eye disease study;  ARMD age related macular degeneration; POAG primary open angle glaucoma; EBMD epithelial/anterior basement membrane dystrophy; ACIOL anterior chamber intraocular lens; IOL intraocular lens; PCIOL posterior chamber intraocular lens; Phaco/IOL phacoemulsification with intraocular lens placement; PRK photorefractive keratectomy; LASIK laser assisted in situ keratomileusis; HTN hypertension; DM diabetes mellitus; COPD chronic obstructive pulmonary disease

## 2020-09-08 ENCOUNTER — Ambulatory Visit (INDEPENDENT_AMBULATORY_CARE_PROVIDER_SITE_OTHER): Payer: Medicare Other | Admitting: Ophthalmology

## 2020-09-08 ENCOUNTER — Other Ambulatory Visit: Payer: Self-pay

## 2020-09-08 ENCOUNTER — Encounter (INDEPENDENT_AMBULATORY_CARE_PROVIDER_SITE_OTHER): Payer: Self-pay | Admitting: Ophthalmology

## 2020-09-08 DIAGNOSIS — H34831 Tributary (branch) retinal vein occlusion, right eye, with macular edema: Secondary | ICD-10-CM | POA: Diagnosis not present

## 2020-09-08 DIAGNOSIS — H3581 Retinal edema: Secondary | ICD-10-CM

## 2020-09-08 DIAGNOSIS — I10 Essential (primary) hypertension: Secondary | ICD-10-CM | POA: Diagnosis not present

## 2020-09-08 DIAGNOSIS — H43811 Vitreous degeneration, right eye: Secondary | ICD-10-CM

## 2020-09-08 DIAGNOSIS — H00012 Hordeolum externum right lower eyelid: Secondary | ICD-10-CM

## 2020-09-08 DIAGNOSIS — H35033 Hypertensive retinopathy, bilateral: Secondary | ICD-10-CM

## 2020-09-08 DIAGNOSIS — H25813 Combined forms of age-related cataract, bilateral: Secondary | ICD-10-CM

## 2020-09-08 MED ORDER — AFLIBERCEPT 2MG/0.05ML IZ SOLN FOR KALEIDOSCOPE
2.0000 mg | INTRAVITREAL | Status: AC | PRN
  Administered 2020-09-08: 2 mg via INTRAVITREAL

## 2020-09-29 NOTE — Progress Notes (Signed)
[facility] - Clinic Note  10/06/2020     CHIEF COMPLAINT Patient presents for Retina Follow Up   HISTORY OF PRESENT ILLNESS: [name] [name] is a 75 y.o. male who presents to the clinic today for:   HPI    Retina Follow Up    Patient presents with  CRVO/BRVO.  In right eye.  This started 4 weeks ago.  I, the attending physician,  performed the HPI with the patient and updated documentation appropriately.          Comments    Patient here for 4 weeks retina follow up for BRVO OD. Patient states vision doing fine. No eye pain.        Last edited by [name], MD on 10/06/2020  1:06 PM. (History)    pt states vision is good   Referring physician: Margot Ables, MD [address] [place],  [place] [address]  HISTORICAL INFORMATION:   Selected notes from the MEDICAL RECORD NUMBER Referred by [name] for concern of vein occlusion OD LEE:  Ocular Hx- PMH-   CURRENT MEDICATIONS: No current outpatient medications on file. (Ophthalmic Drugs)   No current facility-administered medications for this visit. (Ophthalmic Drugs)   Current Outpatient Medications (Other)  Medication Sig  . acetaminophen (TYLENOL) 500 MG tablet Take 500-1,000 mg by mouth every 6 (six) hours as needed for moderate pain or headache.  . Cyanocobalamin (B-12 PO) Take 1 capsule by mouth daily.  . finasteride (PROSCAR) 5 MG tablet Take 5 mg by mouth daily.  . hydrOXYzine (ATARAX/VISTARIL) 25 MG tablet Take 25 mg by mouth 2 (two) times daily.  Marland Kitchen levothyroxine (SYNTHROID, LEVOTHROID) 100 MCG tablet TAKE 1 TABLET BY MOUTH ONCE DAILY  . MELATONIN PO Take 1 tablet by mouth at bedtime.  . Multiple Vitamin (MULTIVITAMIN WITH MINERALS) TABS tablet Take 1 tablet by mouth daily.  . pravastatin (PRAVACHOL) 40 MG tablet TAKE 1 TABLET BY MOUTH EVERY DAY  . sertraline (ZOLOFT) 100 MG tablet Take 1.5 tablets (150 mg total) by mouth daily.  . tamsulosin (FLOMAX) 0.4 MG CAPS  capsule Take by mouth.  Marland Kitchen VITAMIN D PO Take 1 capsule by mouth daily.   No current facility-administered medications for this visit. (Other)      REVIEW OF SYSTEMS: ROS    Positive for: Endocrine, Cardiovascular, Eyes, Heme/Lymph   Negative for: Constitutional, Gastrointestinal, Neurological, Skin, Genitourinary, Musculoskeletal, HENT, Respiratory, Psychiatric, Allergic/Imm   Last edited by Laddie Aquas, COA on 10/06/2020  8:54 AM. (History)       ALLERGIES Allergies  Allergen Reactions  . Egg White (Diagnostic)     Showed up on allergy test  . Penicillins     Did it involve swelling of the face/tongue/throat, SOB, or low BP? No Did it involve sudden or severe rash/hives, skin peeling, or any reaction on the inside of your mouth or nose? No Did you need to seek medical attention at a hospital or doctor's office? Unknown When did it last happen?Childhood allergy If all above answers are "NO", may proceed with cephalosporin use.   . Shrimp [Shellfish Allergy]     Showed up on allergy test  . Wheat Bran     Showed up on allergy test  . Sulfa Antibiotics Rash    PAST MEDICAL HISTORY Past Medical History:  Diagnosis Date  . 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 Cataract    Mixed form OU  . History of skin cancer 04/29/2017  .  History of skin cancer 04/29/2017  . Hypertensive retinopathy    OU  . Hypothyroidism 04/29/2017  . Itching 04/29/2017  . Scabies 05/28/2017   Past Surgical History:  Procedure Laterality Date  . PPM GENERATOR CHANGEOUT N/A 06/14/2019   Procedure: PPM GENERATOR CHANGEOUT;  Surgeon: [name], MD;  Location: [facility] INVASIVE CV LAB;  Service: Cardiovascular;  Laterality: N/A;    FAMILY HISTORY Family History  Problem Relation Age of Onset  . Hypertension Brother     SOCIAL HISTORY Social History   Tobacco Use  . Smoking status: Never Smoker  . Smokeless tobacco: Never Used  Substance Use Topics  . Alcohol use: Yes  . Drug  use: No         OPHTHALMIC EXAM:  Base Eye Exam    Visual Acuity (Snellen - Linear)      Right Left   Dist [place] 20/20 20/30 -2   Dist ph [place]  20/20 -2       Tonometry (Tonopen, 8:52 AM)      Right Left   Pressure 13 12       Pupils      Dark Light Shape React APD   Right 3 2 Round Brisk None   Left 3 2 Round Brisk None       Visual Fields (Counting fingers)      Left Right    Full Full       Extraocular Movement      Right Left    Full, Ortho Full, Ortho       Neuro/Psych    Oriented x3: Yes   Mood/Affect: Normal       Dilation    Both eyes: 1.0% Mydriacyl, 2.5% Phenylephrine @ 8:52 AM        Slit Lamp and Fundus Exam    Slit Lamp Exam      Right Left   Lids/Lashes Dermatochalasis - upper lid, Meibomian gland dysfunction, inflammed Hordeolum - lower lid - improving Dermatochalasis - upper lid, Meibomian gland dysfunction   Conjunctiva/Sclera White and quiet White and quiet   Cornea trace Punctate epithelial erosions Trace inferior Punctate epithelial erosions, mild EBMD   Anterior Chamber Deep and quiet Deep and quiet   Iris Round and dilated Round and dilated   Lens 2-3+ Nuclear sclerosis, 2-3+ Cortical cataract 2-3+ Nuclear sclerosis, 2-3+ Cortical cataract   Vitreous Vitreous syneresis, Posterior vitreous detachment, vitreous condensations Vitreous syneresis, Posterior vitreous detachment       Fundus Exam      Right Left   Disc Pink and Sharp Pink and Sharp, focal PPP   C/D Ratio 0.5 0.6   Macula Blunted foveal reflex, focal IRH and edema IT to fovea -- slightly improved, prominent MA's temporal and inferior to fovea -- appear amenable to focal laser Flat, Good foveal reflex, Retinal pigment epithelial mottling, No heme or edema   Vessels attenuated, mild tortuousity attenuated, Tortuous   Periphery Attached, no heme, RT/RD Attached             IMAGING AND PROCEDURES  Imaging and Procedures for @TODAY @  OCT, Retina - OU - Both Eyes        Right Eye Quality was good. Central Foveal Thickness: 373. Progression has improved. Findings include abnormal foveal contour, intraretinal fluid, no SRF, intraretinal hyper-reflective material (Mild interval improvement in central cystic changes; Partial PVD).   Left Eye Quality was good. Central Foveal Thickness: 327. Progression has been stable. Findings include normal foveal contour, no  IRF, no SRF (Partial PVD).   Notes *Images captured and stored on drive  Diagnosis / Impression:  OD: BRVO w/ mild interval improvement in central cystic changes; Partial PVD OS: NFP, no IRF/SRF  Clinical management:  See below  Abbreviations: NFP - Normal foveal profile. CME - cystoid macular edema. PED - pigment epithelial detachment. IRF - intraretinal fluid. SRF - subretinal fluid. EZ - ellipsoid zone. ERM - epiretinal membrane. ORA - outer retinal atrophy. ORT - outer retinal tubulation. SRHM - subretinal hyper-reflective material        Intravitreal Injection, Pharmacologic Agent - OD - Right Eye       Time Out 10/06/2020. 9:32 AM. Confirmed correct patient, procedure, site, and patient consented.   Anesthesia Topical anesthesia was used. Anesthetic medications included Lidocaine 2%, Proparacaine 0.5%.   Procedure Preparation included 5% betadine to ocular surface, eyelid speculum. A (32g) needle was used.   Injection:  2 mg aflibercept Gretta Cool(EYLEA) SOLN   NDC: [id number], Lot: [id number], Expiration date: 06/22/2021   Route: Intravitreal, Site: Right Eye, Waste: 0.05 mL                 ASSESSMENT/PLAN:    ICD-10-CM   1. Branch retinal vein occlusion of right eye with macular edema  H34.8310 Intravitreal Injection, Pharmacologic Agent - OD - Right Eye    aflibercept (EYLEA) SOLN 2 mg  2. Retinal edema  H35.81 OCT, Retina - OU - Both Eyes  3. Essential hypertension  I10   4. Hypertensive retinopathy of both eyes  H35.033   5. Combined forms of age-related cataract of  both eyes  H25.813   6. Posterior vitreous detachment of right eye  H43.811     1,2. BRVO with CME OD  - FA 5.14.21 shows +focal telangectasias with late leakage inferior macula consistent with remote inf BRVO  - S/P IVA OD #1 (05.14.21), #2 (06.14.21), #3 (07.16.21) -- IVA resistance  - s/p IVE OD #1 (08.18.21) -- sample, #2 (09.15.21), #3 (10.18.21), #4 (11.17.21), #5 (12.15.21), #6 (01.14.22), #7 (02.11.22), #8 (03.18.22)  - BCVA 20/20 OD (stable)  - OCT shows interval improvement in cystic changes  - recommend IVE OD #9 today, 04.15.22 w/ return in 4 wks  - pt wishes to proceed  - RBA of procedure discussed, questions answered  - informed consent obtained  - Eylea informed consent form signed and scanned on 08.18.2021  - see procedure note  - [employer] benefits investigation started, 08.18.21 -- approved as of 09.15.21 -- secondary ins pays -- verified for 2022  - F/U 4 weeks -- DFE/OCT/possible injection v focal laser OD  3,4. Hypertensive retinopathy OU  - discussed importance of tight BP control  - monitor   5. Mixed form cataracts OU  - The symptoms of cataract, surgical options, and treatments and risks were discussed with patient.  - discussed diagnosis and progression  - not yet visually significant  - monitor for now  6. Acute PVD / vitreous syneresis  - Discussed findings and prognosis  - No RT or RD on 360 scleral depressed exam  - Reviewed s/s of RT/RD  - Strict return precautions for any such RT/RD signs/symptoms  - monitor   Ophthalmic Meds Ordered this visit:  Meds ordered this encounter  Medications  . aflibercept (EYLEA) SOLN 2 mg       Return in about 4 weeks (around 11/03/2020) for f/u BRVO OD, DFE, OCT.  There are no Patient Instructions on file for this visit.  This  document serves as a record of services personally performed by Karie Chimera, MD, PhD. It was created on their behalf by Glee Arvin. Manson Passey, OA an ophthalmic technician. The creation  of this record is the provider's dictation and/or activities during the visit.    Electronically signed by: Glee Arvin. Manson Passey, [place] 04.08.2022 1:07 PM  Karie Chimera, M.D., Ph.D. Diseases & Surgery of the Retina and Vitreous [facility]  I have reviewed the above documentation for accuracy and completeness, and I agree with the above. Karie Chimera, M.D., Ph.D. 10/06/20 1:07 PM  Abbreviations: M myopia (nearsighted); A astigmatism; H hyperopia (farsighted); P presbyopia; Mrx spectacle prescription;  CTL contact lenses; OD right eye; OS left eye; OU both eyes  XT exotropia; ET esotropia; PEK punctate epithelial keratitis; PEE punctate epithelial erosions; DES dry eye syndrome; MGD meibomian gland dysfunction; ATs artificial tears; PFAT's preservative free artificial tears; NSC nuclear sclerotic cataract; PSC posterior subcapsular cataract; ERM epi-retinal membrane; PVD posterior vitreous detachment; RD retinal detachment; DM diabetes mellitus; DR diabetic retinopathy; NPDR non-proliferative diabetic retinopathy; PDR proliferative diabetic retinopathy; CSME clinically significant macular edema; DME diabetic macular edema; dbh dot blot hemorrhages; CWS cotton wool spot; POAG primary open angle glaucoma; C/D cup-to-disc ratio; HVF humphrey visual field; GVF goldmann visual field; OCT optical coherence tomography; IOP intraocular pressure; BRVO Branch retinal vein occlusion; CRVO central retinal vein occlusion; CRAO central retinal artery occlusion; BRAO branch retinal artery occlusion; RT retinal tear; SB scleral buckle; PPV pars plana vitrectomy; VH Vitreous hemorrhage; PRP panretinal laser photocoagulation; IVK intravitreal kenalog; VMT vitreomacular traction; MH Macular hole;  NVD neovascularization of the disc; NVE neovascularization elsewhere; AREDS age related eye disease study; ARMD age related macular degeneration; POAG primary open angle glaucoma; EBMD epithelial/anterior  basement membrane dystrophy; ACIOL anterior chamber intraocular lens; IOL intraocular lens; PCIOL posterior chamber intraocular lens; Phaco/IOL phacoemulsification with intraocular lens placement; PRK photorefractive keratectomy; LASIK laser assisted in situ keratomileusis; HTN hypertension; DM diabetes mellitus; COPD chronic obstructive pulmonary disease

## 2020-10-06 ENCOUNTER — Ambulatory Visit (INDEPENDENT_AMBULATORY_CARE_PROVIDER_SITE_OTHER): Payer: Medicare Other | Admitting: Ophthalmology

## 2020-10-06 ENCOUNTER — Other Ambulatory Visit: Payer: Self-pay

## 2020-10-06 ENCOUNTER — Encounter (INDEPENDENT_AMBULATORY_CARE_PROVIDER_SITE_OTHER): Payer: Self-pay | Admitting: Ophthalmology

## 2020-10-06 DIAGNOSIS — H43811 Vitreous degeneration, right eye: Secondary | ICD-10-CM

## 2020-10-06 DIAGNOSIS — H25813 Combined forms of age-related cataract, bilateral: Secondary | ICD-10-CM

## 2020-10-06 DIAGNOSIS — I10 Essential (primary) hypertension: Secondary | ICD-10-CM | POA: Diagnosis not present

## 2020-10-06 DIAGNOSIS — H3581 Retinal edema: Secondary | ICD-10-CM | POA: Diagnosis not present

## 2020-10-06 DIAGNOSIS — H35033 Hypertensive retinopathy, bilateral: Secondary | ICD-10-CM | POA: Diagnosis not present

## 2020-10-06 DIAGNOSIS — H34831 Tributary (branch) retinal vein occlusion, right eye, with macular edema: Secondary | ICD-10-CM | POA: Diagnosis not present

## 2020-10-06 MED ORDER — AFLIBERCEPT 2MG/0.05ML IZ SOLN FOR KALEIDOSCOPE
2.0000 mg | INTRAVITREAL | Status: AC | PRN
  Administered 2020-10-06: 2 mg via INTRAVITREAL

## 2020-10-10 ENCOUNTER — Emergency Department (HOSPITAL_COMMUNITY): Payer: Medicare Other

## 2020-10-10 ENCOUNTER — Emergency Department (HOSPITAL_COMMUNITY)
Admission: EM | Admit: 2020-10-10 | Discharge: 2020-10-10 | Disposition: A | Discharge status: Home / Self Care | Payer: Medicare Other | Attending: Emergency Medicine | Admitting: Emergency Medicine

## 2020-10-10 ENCOUNTER — Other Ambulatory Visit: Payer: Self-pay

## 2020-10-10 DIAGNOSIS — S01111A Laceration without foreign body of right eyelid and periocular area, initial encounter: Secondary | ICD-10-CM | POA: Diagnosis not present

## 2020-10-10 DIAGNOSIS — Z95810 Presence of automatic (implantable) cardiac defibrillator: Secondary | ICD-10-CM | POA: Diagnosis not present

## 2020-10-10 DIAGNOSIS — Z85828 Personal history of other malignant neoplasm of skin: Secondary | ICD-10-CM | POA: Insufficient documentation

## 2020-10-10 DIAGNOSIS — S80212A Abrasion, left knee, initial encounter: Secondary | ICD-10-CM | POA: Diagnosis not present

## 2020-10-10 DIAGNOSIS — S8002XA Contusion of left knee, initial encounter: Secondary | ICD-10-CM

## 2020-10-10 DIAGNOSIS — W010XXA Fall on same level from slipping, tripping and stumbling without subsequent striking against object, initial encounter: Secondary | ICD-10-CM | POA: Diagnosis not present

## 2020-10-10 DIAGNOSIS — S0081XA Abrasion of other part of head, initial encounter: Secondary | ICD-10-CM | POA: Diagnosis not present

## 2020-10-10 DIAGNOSIS — S0000XA Unspecified superficial injury of scalp, initial encounter: Secondary | ICD-10-CM | POA: Diagnosis present

## 2020-10-10 DIAGNOSIS — E039 Hypothyroidism, unspecified: Secondary | ICD-10-CM | POA: Diagnosis not present

## 2020-10-10 DIAGNOSIS — S0101XA Laceration without foreign body of scalp, initial encounter: Secondary | ICD-10-CM

## 2020-10-10 MED ORDER — ACETAMINOPHEN 325 MG PO TABS
650.0000 mg | ORAL_TABLET | Freq: Once | ORAL | Status: AC
  Administered 2020-10-10: 650 mg via ORAL
  Filled 2020-10-10: qty 2

## 2020-10-10 NOTE — ED Notes (Signed)
Yao, MD at bedside.  

## 2020-10-10 NOTE — ED Triage Notes (Addendum)
Pt via ems. Patient reports being at [employer] and had a minor fall caused by tripping on a curb. Injury to head and left knee. -LOC. -Blood thinners. -Dizziness. Per ems minor bleeding to forehead which is wrapped at this time. A&Ox4. Laceration noted to forehead with left knee abrasion.

## 2020-10-10 NOTE — Discharge Instructions (Signed)
Take Tylenol for pain.  Apply ice to help with swelling.  See your doctor for follow-up.  Expect your face to become black and blue  Return to ER if you have worse headaches, uncontrolled bleeding, vomiting, severe pain

## 2020-10-10 NOTE — ED Provider Notes (Signed)
[facility]-EMERGENCY DEPT Provider Note   CSN: [id number] Arrival date & time: 10/10/20  2116     History Chief Complaint  Patient presents with  . Fall    [name] [name] is a 75 y.o. male who presented with fall.  Patient states that he was trying to get into [employer] and tripped and fell on the curb and hit his right forehead and also landed on the left knee.  Patient was noted to have a laceration of the right forehead and was wrapped by EMS.  Patient states that his tetanus is up-to-date.  Patient is not currently on blood thinners.  Denies any chest pain or passing out.  The history is provided by the patient.       Past Medical History:  Diagnosis Date  . 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 Cataract    Mixed form OU  . History of skin cancer 04/29/2017  . History of skin cancer 04/29/2017  . Hypertensive retinopathy    OU  . Hypothyroidism 04/29/2017  . Itching 04/29/2017  . Scabies 05/28/2017    Patient Active Problem List   Diagnosis Date Noted  . 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 Relationship problem between partners 04/02/2018  . 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 Hypothyroidism 04/29/2017    Past Surgical History:  Procedure Laterality Date  . PPM GENERATOR CHANGEOUT N/A 06/14/2019   Procedure: PPM GENERATOR CHANGEOUT;  Surgeon: [name], MD;  Location: [facility] INVASIVE CV LAB;  Service: Cardiovascular;  Laterality: N/A;       Family History  Problem Relation Age of Onset  . Hypertension Brother     Social History   Tobacco Use  . Smoking status: Never Smoker  . Smokeless tobacco: Never Used  Substance Use Topics  . Alcohol use: Yes  . Drug use: No    Home Medications Prior to Admission medications   Medication Sig Start Date End Date Taking? Authorizing Provider  acetaminophen (TYLENOL) 500 MG tablet Take 500-1,000 mg by mouth every 6 (six) hours as needed for moderate pain or headache.    [provider]  Cyanocobalamin (B-12 PO) Take 1 capsule by mouth daily.    [provider]  finasteride (PROSCAR) 5 MG tablet Take 5 mg by mouth daily. 04/11/20   [provider]  hydrOXYzine (ATARAX/VISTARIL) 25 MG tablet Take 25 mg by mouth 2 (two) times daily.    [provider]  levothyroxine (SYNTHROID, LEVOTHROID) 100 MCG tablet TAKE 1 TABLET BY MOUTH ONCE DAILY 02/03/18   [name], MD  MELATONIN PO Take 1 tablet by mouth at bedtime.    [provider]  Multiple Vitamin (MULTIVITAMIN WITH MINERALS) TABS tablet Take 1 tablet by mouth daily.    [provider]  pravastatin (PRAVACHOL) 40 MG tablet TAKE 1 TABLET BY MOUTH EVERY DAY 03/30/18   [name], MD  sertraline (ZOLOFT) 100 MG tablet Take 1.5 tablets (150 mg total) by mouth daily. 02/10/18   [name], MD  tamsulosin (FLOMAX) 0.4 MG CAPS capsule Take by mouth. 12/30/19   [provider]  VITAMIN D PO Take 1 capsule by mouth daily.    [provider]    Allergies    Egg white (diagnostic), Penicillins, Shrimp [shellfish allergy], Wheat bran, and Sulfa antibiotics  Review of Systems   Review of Systems  Musculoskeletal:       Left knee pain  Skin: Positive for wound.  All other systems  reviewed and are negative.   Physical Exam Updated Vital Signs BP 139/77 (BP Location: Right Arm)   Pulse 66   Temp 98.8 F (37.1 C) (Oral)   Resp 18   Ht 6\' 2"  (1.88 m)   Wt 90.7 kg   SpO2 97%   BMI 25.68 kg/m   Physical Exam Vitals and nursing note reviewed.  HENT:     Head:     Comments: 1 cm laceration above the right eyebrow and not involving the eyebrow     Mouth/Throat:     Mouth: Mucous membranes are moist.  Eyes:     Extraocular Movements: Extraocular movements intact.     Pupils: Pupils are equal, round, and reactive to light.  Cardiovascular:     Rate and Rhythm: Normal rate and regular rhythm.     Pulses: Normal pulses.     Heart sounds: Normal heart  sounds.  Pulmonary:     Effort: Pulmonary effort is normal.     Breath sounds: Normal breath sounds.  Abdominal:     General: Abdomen is flat.     Palpations: Abdomen is soft.  Musculoskeletal:     Cervical back: Normal range of motion and neck supple.     Comments: Abrasion of the left knee with normal range of motion.  Patient able to bear weight on the leg  Skin:    General: Skin is warm.     Capillary Refill: Capillary refill takes less than 2 seconds.  Neurological:     General: No focal deficit present.     Mental Status: He is alert and oriented to person, place, and time.  Psychiatric:        Mood and Affect: Mood normal.        Behavior: Behavior normal.     ED Results / Procedures / Treatments   Labs (all labs ordered are listed, but only abnormal results are displayed) Labs Reviewed - No data to display  EKG None  Radiology CT Head Wo Contrast  Result Date: 10/10/2020 CLINICAL DATA:  Minor fall caused by tripping on curb EXAM: CT HEAD WITHOUT CONTRAST CT CERVICAL SPINE WITHOUT CONTRAST TECHNIQUE: Multidetector CT imaging of the head and cervical spine was performed following the standard protocol without intravenous contrast. Multiplanar CT image reconstructions of the cervical spine were also generated. COMPARISON:  CT head and C-spine 12/05/2018 FINDINGS: CT HEAD FINDINGS Brain: No evidence of large-territorial acute infarction. No parenchymal hemorrhage. No mass lesion. No extra-axial collection. No mass effect or midline shift. No hydrocephalus. Basilar cisterns are patent. Vascular: No hyperdense vessel. Atherosclerotic calcifications are present within the cavernous internal carotid arteries. Skull: No acute fracture or focal lesion. Sinuses/Orbits: Paranasal sinuses and mastoid air cells are clear. The orbits are unremarkable. Other: 6 mm right frontal scalp hematoma. CT CERVICAL SPINE FINDINGS Alignment: Normal. Skull base and vertebrae: Multilevel degenerative  changes of the spine. No severe osseous neural foraminal or central canal stenosis. No acute fracture. No aggressive appearing focal osseous lesion or focal pathologic process. Soft tissues and spinal canal: No prevertebral fluid or swelling. No visible canal hematoma. Upper chest: Unremarkable. Other: None. IMPRESSION: 1. No acute intracranial abnormality. 2. No acute displaced fracture or traumatic listhesis of the cervical spine. Electronically Signed   By: 12/07/2018 M.D.   On: 10/10/2020 22:17   CT Cervical Spine Wo Contrast  Result Date: 10/10/2020 CLINICAL DATA:  Minor fall caused by tripping on curb EXAM: CT HEAD WITHOUT CONTRAST CT CERVICAL SPINE WITHOUT  CONTRAST TECHNIQUE: Multidetector CT imaging of the head and cervical spine was performed following the standard protocol without intravenous contrast. Multiplanar CT image reconstructions of the cervical spine were also generated. COMPARISON:  CT head and C-spine 12/05/2018 FINDINGS: CT HEAD FINDINGS Brain: No evidence of large-territorial acute infarction. No parenchymal hemorrhage. No mass lesion. No extra-axial collection. No mass effect or midline shift. No hydrocephalus. Basilar cisterns are patent. Vascular: No hyperdense vessel. Atherosclerotic calcifications are present within the cavernous internal carotid arteries. Skull: No acute fracture or focal lesion. Sinuses/Orbits: Paranasal sinuses and mastoid air cells are clear. The orbits are unremarkable. Other: 6 mm right frontal scalp hematoma. CT CERVICAL SPINE FINDINGS Alignment: Normal. Skull base and vertebrae: Multilevel degenerative changes of the spine. No severe osseous neural foraminal or central canal stenosis. No acute fracture. No aggressive appearing focal osseous lesion or focal pathologic process. Soft tissues and spinal canal: No prevertebral fluid or swelling. No visible canal hematoma. Upper chest: Unremarkable. Other: None. IMPRESSION: 1. No acute intracranial  abnormality. 2. No acute displaced fracture or traumatic listhesis of the cervical spine. Electronically Signed   By: Tish Frederickson M.D.   On: 10/10/2020 22:17    Procedures Procedures   LACERATION REPAIR Performed by: Richardean Canal Authorized by: Richardean Canal Consent: Verbal consent obtained. Risks and benefits: risks, benefits and alternatives were discussed Consent given by: patient Patient identity confirmed: provided demographic data Prepped and Draped in normal sterile fashion Wound explored  Laceration Location: R forehead   Laceration Length: 1 cm  No Foreign Bodies seen or palpated  Anesthesia: none   Local anesthetic: none    Irrigation method: syringe Amount of cleaning: standard  Skin closure: Dermabond  Patient tolerance: Patient tolerated the procedure well with no immediate complications.   Medications Ordered in ED Medications  acetaminophen (TYLENOL) tablet 650 mg (has no administration in time range)    ED Course  I have reviewed the triage vital signs and the nursing notes.  Pertinent labs & imaging results that were available during my care of the patient were reviewed by me and considered in my medical decision making (see chart for details).    MDM Rules/Calculators/A&P                         [name] [name] is a 74 y.o. male here presenting with fall.  Patient had a mechanical fall and hit the right forehead and has a small laceration.  I was able to Dermabond the laceration.  We will get CT head neck and also x-ray of the left knee.  10:39 PM CT did not show any bleed or fractures.  Knee x-ray is unremarkable.  Stable for discharge   Final Clinical Impression(s) / ED Diagnoses Final diagnoses:  None    Rx / DC Orders ED Discharge Orders    None       Charlynne Pander, MD 10/10/20 2240

## 2020-10-13 ENCOUNTER — Ambulatory Visit (INDEPENDENT_AMBULATORY_CARE_PROVIDER_SITE_OTHER): Payer: Medicare Other

## 2020-10-13 DIAGNOSIS — I442 Atrioventricular block, complete: Secondary | ICD-10-CM

## 2020-10-14 LAB — CUP PACEART REMOTE DEVICE CHECK
Battery Remaining Longevity: 123 mo
Battery Remaining Percentage: 95.5 %
Battery Voltage: 3.02 V
Brady Statistic AP VP Percent: 44 %
Brady Statistic AP VS Percent: 1 %
Brady Statistic AS VP Percent: 56 %
Brady Statistic AS VS Percent: 1 %
Brady Statistic RA Percent Paced: 44 %
Brady Statistic RV Percent Paced: 99 %
Date Time Interrogation Session: 20220422140449
Implantable Lead Implant Date: 20101229
Implantable Lead Implant Date: 20101229
Implantable Lead Location: 753859
Implantable Lead Location: 753860
Implantable Pulse Generator Implant Date: 20201221
Lead Channel Impedance Value: 430 Ohm
Lead Channel Impedance Value: 530 Ohm
Lead Channel Pacing Threshold Amplitude: 0.625 V
Lead Channel Pacing Threshold Amplitude: 0.75 V
Lead Channel Pacing Threshold Pulse Width: 0.5 ms
Lead Channel Pacing Threshold Pulse Width: 0.5 ms
Lead Channel Sensing Intrinsic Amplitude: 11.4 mV
Lead Channel Sensing Intrinsic Amplitude: 3.5 mV
Lead Channel Setting Pacing Amplitude: 0.875
Lead Channel Setting Pacing Amplitude: 1.75 V
Lead Channel Setting Pacing Pulse Width: 0.5 ms
Lead Channel Setting Sensing Sensitivity: 4 mV
Pulse Gen Model: 2272
Pulse Gen Serial Number: 9190527

## 2020-10-25 ENCOUNTER — Encounter: Payer: Self-pay | Admitting: Internal Medicine

## 2020-10-25 ENCOUNTER — Ambulatory Visit (INDEPENDENT_AMBULATORY_CARE_PROVIDER_SITE_OTHER): Payer: Medicare Other | Admitting: Internal Medicine

## 2020-10-25 ENCOUNTER — Other Ambulatory Visit: Payer: Self-pay

## 2020-10-25 VITALS — BP 122/70 | HR 69 | Ht 74.0 in | Wt 199.8 lb

## 2020-10-25 DIAGNOSIS — I442 Atrioventricular block, complete: Secondary | ICD-10-CM | POA: Diagnosis not present

## 2020-10-25 DIAGNOSIS — Z950 Presence of cardiac pacemaker: Secondary | ICD-10-CM

## 2020-10-25 NOTE — Patient Instructions (Signed)
Medication Instructions:  Your physician recommends that you continue on your current medications as directed. Please refer to the Current Medication list given to you today.  Labwork: None ordered.  Testing/Procedures: None ordered.  Follow-Up: Your physician wants you to follow-up in: one year with Lewayne Bunting, MD or one of the following Advanced Practice Providers on your designated Care Team:    Gypsy Balsam, NP  Francis Dowse, PA-C  Casimiro Needle "Mardelle Matte" [place], [place]  Remote monitoring is used to monitor your Pacemaker from home. This monitoring reduces the number of office visits required to check your device to one time per year. It allows [place] to keep an eye on the functioning of your device to ensure it is working properly. You are scheduled for a device check from home on 01/12/2021. You may send your transmission at any time that day. If you have a wireless device, the transmission will be sent automatically. After your physician reviews your transmission, you will receive a postcard with your next transmission date.  Any Other Special Instructions Will Be Listed Below (If Applicable).  If you need a refill on your cardiac medications before your next appointment, please call your pharmacy.

## 2020-10-25 NOTE — Progress Notes (Signed)
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 Mosetta Putt. 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 He denies chest pain, or sob. No syncope. 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  Allergies  Allergen Reactions  . Egg White (Diagnostic)     Showed up on allergy test  . Penicillins     Did it involve swelling of the face/tongue/throat, SOB, or low BP? No Did it involve sudden or severe rash/hives, skin peeling, or any reaction on the inside of your mouth or nose? No Did you need to seek medical attention at a hospital or doctor's office? Unknown When did it last happen?Childhood allergy If all above answers are "NO", may proceed with cephalosporin use.   . Shrimp [Shellfish Allergy]     Showed up on allergy test  . Wheat Bran     Showed up on allergy test  . Sulfa Antibiotics Rash     Current Outpatient Medications  Medication Sig Dispense Refill  . acetaminophen (TYLENOL) 500 MG tablet Take 500-1,000 mg by mouth every 6 (six) hours as needed for moderate pain or headache.    . Cyanocobalamin (B-12 PO) Take 1 capsule by mouth daily.    . finasteride (PROSCAR) 5 MG tablet Take 5 mg by mouth daily.    Marland Kitchen levothyroxine (SYNTHROID, LEVOTHROID) 100 MCG tablet TAKE 1 TABLET BY MOUTH ONCE DAILY 90 tablet 0  . MELATONIN PO Take 1 tablet by mouth at bedtime.    . Multiple Vitamin (MULTIVITAMIN WITH MINERALS) TABS tablet Take 1 tablet by mouth daily.    . pravastatin (PRAVACHOL) 40 MG tablet TAKE 1 TABLET BY MOUTH EVERY DAY 90 tablet 3  . sertraline (ZOLOFT) 100 MG tablet Take 1.5 tablets (150 mg total) by mouth daily. 135 tablet 0  . tamsulosin (FLOMAX) 0.4 MG CAPS capsule Take by mouth.    Marland Kitchen VITAMIN D PO Take 1 capsule by mouth daily.     No current facility-administered medications for this visit.      Past Medical History:  Diagnosis Date  . 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 Cataract    Mixed form OU  . History of skin cancer 04/29/2017  . History of skin cancer 04/29/2017  . Hypertensive retinopathy    OU  . Hypothyroidism 04/29/2017  . Itching 04/29/2017  . Scabies 05/28/2017    ROS:   All systems reviewed and negative except as noted in the HPI.   Past Surgical History:  Procedure Laterality Date  . PPM GENERATOR CHANGEOUT N/A 06/14/2019   Procedure: PPM GENERATOR CHANGEOUT;  Surgeon: [name], MD;  Location: [facility] INVASIVE CV LAB;  Service: Cardiovascular;  Laterality: N/A;     Family History  Problem Relation Age of Onset  . Hypertension Brother      Social History   Socioeconomic History  . Marital status: Divorced    Spouse name: Not on file  . Number of children: Not on file  . Years of education: Not on file  . Highest education level: Not on file  Occupational History  . Not on file  Tobacco Use  . Smoking status: Never Smoker  . Smokeless tobacco: Never Used  Substance and Sexual Activity  . Alcohol use: Yes  . Drug use: No  . Sexual activity: Not on file  Other Topics Concern  .  Not on file  Social History Narrative  . Not on file   Social Determinants of Health   Financial Resource Strain: Not on file  Food Insecurity: Not on file  Transportation Needs: Not on file  Physical Activity: Not on file  Stress: Not on file  Social Connections: Not on file  Intimate Partner Violence: Not on file     BP 122/70   Pulse 69   Ht 6\' 2"  (1.88 m)   Wt 199 lb 12.8 oz (90.6 kg)   SpO2 96%   BMI 25.65 kg/m   Physical Exam:  Well appearing NAD HEENT: Unremarkable Neck:  No JVD, no thyromegally Lymphatics:  No adenopathy Back:  No CVA tenderness Lungs:  Clear with no wheezes HEART:  Regular rate rhythm, no murmurs, no rubs, no clicks Abd:  soft, positive bowel sounds, no organomegally, no rebound, no guarding Ext:  2 plus  pulses, no edema, no cyanosis, no clubbing Skin:  No rashes no nodules Neuro:  CN II through XII intact, motor grossly intact  EKG - nsr with ventricular pacing  DEVICE  Normal device function.  See PaceArt for details.   Assess/Plan: 1. CHB - he is asymptomatic, s/p PPM insertion. 2. PPM - his [employer] Jude DDD PM is working normally.  3. Dyslipidemia - continue statin therapy.  Kiley Solimine,MD

## 2020-11-01 NOTE — Progress Notes (Signed)
[facility] - Clinic Note  11/03/2020     CHIEF COMPLAINT Patient presents for Retina Follow Up   HISTORY OF PRESENT ILLNESS: [name] [name] is a 75 y.o. male who presents to the clinic today for:   HPI    Retina Follow Up    Patient presents with  CRVO/BRVO.  In right eye.  This started 1 year ago.  Severity is mild.  Duration of 4 weeks.  Since onset it is stable.  I, the attending physician,  performed the HPI with the patient and updated documentation appropriately.          Comments    75 y/o male pt here for 4 wk f/u for BRVO w/CME OD.  No change in [place] OU noticed.  Denies pain, FOL, floaters.  No gtts.       Last edited by [name], MD on 11/03/2020  1:36 PM. (History)    pt states vision is good   Referring physician: Margot Ables, MD [address] [place],  [place] [address]  HISTORICAL INFORMATION:   Selected notes from the MEDICAL RECORD NUMBER Referred by [name] for concern of vein occlusion OD LEE:  Ocular Hx- PMH-   CURRENT MEDICATIONS: Current Outpatient Medications (Ophthalmic Drugs)  Medication Sig  . prednisoLONE acetate (PRED FORTE) 1 % ophthalmic suspension Place 1 drop into the right eye 4 (four) times daily for 7 days.   No current facility-administered medications for this visit. (Ophthalmic Drugs)   Current Outpatient Medications (Other)  Medication Sig  . acetaminophen (TYLENOL) 500 MG tablet Take 500-1,000 mg by mouth every 6 (six) hours as needed for moderate pain or headache.  . Cyanocobalamin (B-12 PO) Take 1 capsule by mouth daily.  . finasteride (PROSCAR) 5 MG tablet Take 5 mg by mouth daily.  Marland Kitchen levothyroxine (SYNTHROID, LEVOTHROID) 100 MCG tablet TAKE 1 TABLET BY MOUTH ONCE DAILY  . MELATONIN PO Take 1 tablet by mouth at bedtime.  . Multiple Vitamin (MULTIVITAMIN WITH MINERALS) TABS tablet Take 1 tablet by mouth daily.  . pravastatin (PRAVACHOL) 40 MG tablet TAKE 1 TABLET BY MOUTH  EVERY DAY  . sertraline (ZOLOFT) 100 MG tablet Take 1.5 tablets (150 mg total) by mouth daily.  . sildenafil (VIAGRA) 100 MG tablet Take 50-100 mg by mouth daily as needed.  . tamsulosin (FLOMAX) 0.4 MG CAPS capsule Take by mouth.  Marland Kitchen VITAMIN D PO Take 1 capsule by mouth daily.   No current facility-administered medications for this visit. (Other)      REVIEW OF SYSTEMS: ROS    Positive for: Skin, Cardiovascular, Eyes   Negative for: Constitutional, Gastrointestinal, Neurological, Genitourinary, Musculoskeletal, HENT, Endocrine, Respiratory, Psychiatric, Allergic/Imm, Heme/Lymph   Last edited by Celine Mans, COA on 11/03/2020  9:36 AM. (History)       ALLERGIES Allergies  Allergen Reactions  . Egg White (Diagnostic)     Showed up on allergy test  . Penicillins     Did it involve swelling of the face/tongue/throat, SOB, or low BP? No Did it involve sudden or severe rash/hives, skin peeling, or any reaction on the inside of your mouth or nose? No Did you need to seek medical attention at a hospital or doctor's office? Unknown When did it last happen?Childhood allergy If all above answers are "NO", may proceed with cephalosporin use.   . Shrimp [Shellfish Allergy]     Showed up on allergy test  . Wheat Bran     Showed  up on allergy test  . Sulfa Antibiotics Rash    PAST MEDICAL HISTORY Past Medical History:  Diagnosis Date  . 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 Cataract    Mixed form OU  . History of skin cancer 04/29/2017  . History of skin cancer 04/29/2017  . Hypertensive retinopathy    OU  . Hypothyroidism 04/29/2017  . Itching 04/29/2017  . Scabies 05/28/2017   Past Surgical History:  Procedure Laterality Date  . PPM GENERATOR CHANGEOUT N/A 06/14/2019   Procedure: PPM GENERATOR CHANGEOUT;  Surgeon: [name], MD;  Location: [facility] INVASIVE CV LAB;  Service: Cardiovascular;  Laterality: N/A;    FAMILY HISTORY Family History  Problem Relation Age  of Onset  . Hypertension Brother     SOCIAL HISTORY Social History   Tobacco Use  . Smoking status: Never Smoker  . Smokeless tobacco: Never Used  Substance Use Topics  . Alcohol use: Yes  . Drug use: No         OPHTHALMIC EXAM:  Base Eye Exam    Visual Acuity (Snellen - Linear)      Right Left   Dist [place] 20/15 -2 20/15 -       Tonometry (Tonopen, 9:40 AM)      Right Left   Pressure 12 13       Pupils      Dark Light Shape React APD   Right 3 2 Round Brisk None   Left 3 2 Round Brisk None       Visual Fields (Counting fingers)      Left Right    Full Full       Extraocular Movement      Right Left    Full, Ortho Full, Ortho       Neuro/Psych    Oriented x3: Yes   Mood/Affect: Normal       Dilation    Both eyes: 1.0% Mydriacyl, 2.5% Phenylephrine @ 9:40 AM        Slit Lamp and Fundus Exam    Slit Lamp Exam      Right Left   Lids/Lashes Dermatochalasis - upper lid, Meibomian gland dysfunction, inflammed Hordeolum - lower lid - improving Dermatochalasis - upper lid, Meibomian gland dysfunction   Conjunctiva/Sclera White and quiet White and quiet   Cornea trace Punctate epithelial erosions Trace inferior Punctate epithelial erosions, mild EBMD   Anterior Chamber Deep and quiet Deep and quiet   Iris Round and dilated Round and dilated   Lens 2-3+ Nuclear sclerosis, 2-3+ Cortical cataract 2-3+ Nuclear sclerosis, 2-3+ Cortical cataract   Vitreous Vitreous syneresis, Posterior vitreous detachment, vitreous condensations Vitreous syneresis, Posterior vitreous detachment       Fundus Exam      Right Left   Disc Pink and Sharp Pink and Sharp, mild PPA   C/D Ratio 0.5 0.6   Macula Blunted foveal reflex, focal IRH and edema IT to fovea -- slightly improved, prominent MA's temporal and inferior to fovea -- appear amenable to focal laser Flat, Good foveal reflex, Retinal pigment epithelial mottling, No heme or edema   Vessels attenuated, Tortuous attenuated,  Tortuous   Periphery Attached, no heme, RT/RD Attached             IMAGING AND PROCEDURES  Imaging and Procedures for @TODAY @  OCT, Retina - OU - Both Eyes       Right Eye Quality was good. Central Foveal Thickness: 375. Progression has improved. Findings include abnormal foveal  contour, intraretinal fluid, no SRF, intraretinal hyper-reflective material (Mild interval improvement in central cystic changes; Partial PVD).   Left Eye Quality was good. Central Foveal Thickness: 329. Progression has been stable. Findings include normal foveal contour, no IRF, no SRF (Partial PVD).   Notes *Images captured and stored on drive  Diagnosis / Impression:  OD: BRVO w/ mild interval improvement in central cystic changes; Partial PVD OS: NFP, no IRF/SRF  Clinical management:  See below  Abbreviations: NFP - Normal foveal profile. CME - cystoid macular edema. PED - pigment epithelial detachment. IRF - intraretinal fluid. SRF - subretinal fluid. EZ - ellipsoid zone. ERM - epiretinal membrane. ORA - outer retinal atrophy. ORT - outer retinal tubulation. SRHM - subretinal hyper-reflective material        Focal Laser - OD - Right Eye       LASER PROCEDURE NOTE  Diagnosis:   BRVO w/ macular edema, right eye   Procedure:  Focal laser photocoagulation using slit lamp laser, right eye   Anesthesia:  Topical  Surgeon: [name], MD, PhD   Informed consent obtained, operative eye marked, and time out performed prior to initiation of laser.   Lumenis [id number] Focal/Grid laser Lens: Ocular OMRA-S Power: 130 mW Duration: 50 msec  Spot size: 50 microns  # spots: 38 spots placed to MAs temporal to fovea and in superotemporal arcade.  Complications: None.  RTC: 4 wks  Patient tolerated the procedure well and received written and verbal post-procedure care information/education.                   ASSESSMENT/PLAN:    ICD-10-CM   1. Branch retinal vein occlusion of  right eye with macular edema  H34.8310 Focal Laser - OD - Right Eye    CANCELED: Intravitreal Injection, Pharmacologic Agent - OD - Right Eye  2. Retinal edema  H35.81 OCT, Retina - OU - Both Eyes  3. Essential hypertension  I10   4. Hypertensive retinopathy of both eyes  H35.033   5. Combined forms of age-related cataract of both eyes  H25.813   6. Posterior vitreous detachment of right eye  H43.811   7. Hordeolum externum of right lower eyelid  H00.012     1,2. BRVO with CME OD  - FA 5.14.21 shows +focal telangectasias with late leakage inferior macula consistent with remote inf BRVO  - S/P IVA OD #1 (05.14.21), #2 (06.14.21), #3 (07.16.21) -- IVA resistance  - s/p IVE OD #1 (08.18.21) -- sample, #2 (09.15.21), #3 (10.18.21), #4 (11.17.21), #5 (12.15.21), #6 (01.14.22), #7 (02.11.22), #8 (03.18.22), #9 (04.15.22)  - BCVA 20/15 OD (stable)  - OCT shows mild interval improvement in cystic changes  - recommend focal laser OD today, 05.13.22 w/ return in 4 wks  - pt wishes to proceed with laser  - RBA of procedure discussed, questions answered  - informed consent obtained  - see procedure note  - start PF QID OD x7 days  - [place] informed consent form signed and scanned on 08.18.2021  - [email or web address] benefits investigation started, 08.18.21 -- approved as of 09.15.21 -- secondary ins pays -- verified for 2022  - F/U 4 weeks -- DFE/OCT/possible injection  3,4. Hypertensive retinopathy OU  - discussed importance of tight BP control  - monitor   5. Mixed form cataracts OU  - The symptoms of cataract, surgical options, and treatments and risks were discussed with patient.  - discussed diagnosis and progression  - not yet visually significant  -  monitor for now  6. Acute PVD / vitreous syneresis  - Discussed findings and prognosis  - No RT or RD on 360 scleral depressed exam  - Reviewed s/s of RT/RD  - Strict return precautions for any such RT/RD signs/symptoms  - monitor   Ophthalmic  Meds Ordered this visit:  Meds ordered this encounter  Medications  . prednisoLONE acetate (PRED FORTE) 1 % ophthalmic suspension    Sig: Place 1 drop into the right eye 4 (four) times daily for 7 days.    Dispense:  10 mL    Refill:  0       Return in about 4 weeks (around 12/01/2020) for f/u BRVO OD, DFE, OCT.  There are no Patient Instructions on file for this visit.  This document serves as a record of services personally performed by Karie ChimeraBrian G. Mlissa Tamayo, MD, PhD. It was created on their behalf by Glee ArvinAmanda J. Manson PasseyBrown, OA an ophthalmic technician. The creation of this record is the provider's dictation and/or activities during the visit.    Electronically signed by: Glee ArvinAmanda J. Manson PasseyBrown, [place] 05.11.2022 1:39 PM  Karie ChimeraBrian G. Van Seymore, M.D., Ph.D. Diseases & Surgery of the Retina and Vitreous [facility]  I have reviewed the above documentation for accuracy and completeness, and I agree with the above. Karie ChimeraBrian G. Glorene Leitzke, M.D., Ph.D. 11/03/20 1:39 PM   Abbreviations: M myopia (nearsighted); A astigmatism; H hyperopia (farsighted); P presbyopia; Mrx spectacle prescription;  CTL contact lenses; OD right eye; OS left eye; OU both eyes  XT exotropia; ET esotropia; PEK punctate epithelial keratitis; PEE punctate epithelial erosions; DES dry eye syndrome; MGD meibomian gland dysfunction; ATs artificial tears; PFAT's preservative free artificial tears; NSC nuclear sclerotic cataract; PSC posterior subcapsular cataract; ERM epi-retinal membrane; PVD posterior vitreous detachment; RD retinal detachment; DM diabetes mellitus; DR diabetic retinopathy; NPDR non-proliferative diabetic retinopathy; PDR proliferative diabetic retinopathy; CSME clinically significant macular edema; DME diabetic macular edema; dbh dot blot hemorrhages; CWS cotton wool spot; POAG primary open angle glaucoma; C/D cup-to-disc ratio; HVF humphrey visual field; GVF goldmann visual field; OCT optical coherence tomography; IOP  intraocular pressure; BRVO Branch retinal vein occlusion; CRVO central retinal vein occlusion; CRAO central retinal artery occlusion; BRAO branch retinal artery occlusion; RT retinal tear; SB scleral buckle; PPV pars plana vitrectomy; VH Vitreous hemorrhage; PRP panretinal laser photocoagulation; IVK intravitreal kenalog; VMT vitreomacular traction; MH Macular hole;  NVD neovascularization of the disc; NVE neovascularization elsewhere; AREDS age related eye disease study; ARMD age related macular degeneration; POAG primary open angle glaucoma; EBMD epithelial/anterior basement membrane dystrophy; ACIOL anterior chamber intraocular lens; IOL intraocular lens; PCIOL posterior chamber intraocular lens; Phaco/IOL phacoemulsification with intraocular lens placement; PRK photorefractive keratectomy; LASIK laser assisted in situ keratomileusis; HTN hypertension; DM diabetes mellitus; COPD chronic obstructive pulmonary disease

## 2020-11-01 NOTE — Progress Notes (Signed)
Remote pacemaker transmission.   

## 2020-11-03 ENCOUNTER — Ambulatory Visit (INDEPENDENT_AMBULATORY_CARE_PROVIDER_SITE_OTHER): Payer: Medicare Other | Admitting: Ophthalmology

## 2020-11-03 ENCOUNTER — Other Ambulatory Visit: Payer: Self-pay

## 2020-11-03 ENCOUNTER — Encounter (INDEPENDENT_AMBULATORY_CARE_PROVIDER_SITE_OTHER): Payer: Self-pay | Admitting: Ophthalmology

## 2020-11-03 DIAGNOSIS — H3581 Retinal edema: Secondary | ICD-10-CM | POA: Diagnosis not present

## 2020-11-03 DIAGNOSIS — H43811 Vitreous degeneration, right eye: Secondary | ICD-10-CM

## 2020-11-03 DIAGNOSIS — H34831 Tributary (branch) retinal vein occlusion, right eye, with macular edema: Secondary | ICD-10-CM | POA: Diagnosis not present

## 2020-11-03 DIAGNOSIS — H00012 Hordeolum externum right lower eyelid: Secondary | ICD-10-CM

## 2020-11-03 DIAGNOSIS — H35033 Hypertensive retinopathy, bilateral: Secondary | ICD-10-CM | POA: Diagnosis not present

## 2020-11-03 DIAGNOSIS — I10 Essential (primary) hypertension: Secondary | ICD-10-CM | POA: Diagnosis not present

## 2020-11-03 DIAGNOSIS — H25813 Combined forms of age-related cataract, bilateral: Secondary | ICD-10-CM

## 2020-11-03 MED ORDER — PREDNISOLONE ACETATE 1 % OP SUSP: 1.0000 [drp] | Freq: Four times a day (QID) | OPHTHALMIC | 0 refills | Status: AC

## 2020-11-27 NOTE — Progress Notes (Signed)
[facility] - Clinic Note  12/01/2020     CHIEF COMPLAINT Patient presents for Retina Follow Up   HISTORY OF PRESENT ILLNESS: [name] [name] is a 75 y.o. male who presents to the clinic today for:   HPI     Retina Follow Up   Patient presents with  CRVO/BRVO.  In right eye.  This started 13 months ago.  Severity is mild.  Duration of 4 weeks.  Since onset it is stable.  I, the attending physician,  performed the HPI with the patient and updated documentation appropriately.        Comments   75 y/o male pt here for 4 wk f/u for BRVO OD.  No change in [place] OU.  Denies pain, FOL, floaters.  No gtts.      Last edited by [name], MD on 12/03/2020  1:01 AM.    pt states vision is good, left eye has been red recently according to daughter   Referring physician: Margot Ables, MD [address] [place],  [place] [address]  HISTORICAL INFORMATION:   Selected notes from the MEDICAL RECORD NUMBER Referred by [name] for concern of vein occlusion OD LEE:  Ocular Hx- PMH-   CURRENT MEDICATIONS: No current outpatient medications on file. (Ophthalmic Drugs)   No current facility-administered medications for this visit. (Ophthalmic Drugs)   Current Outpatient Medications (Other)  Medication Sig   acetaminophen (TYLENOL) 500 MG tablet Take 500-1,000 mg by mouth every 6 (six) hours as needed for moderate pain or headache.   Cyanocobalamin (B-12 PO) Take 1 capsule by mouth daily.   finasteride (PROSCAR) 5 MG tablet Take 5 mg by mouth daily.   levothyroxine (SYNTHROID, LEVOTHROID) 100 MCG tablet TAKE 1 TABLET BY MOUTH ONCE DAILY   MELATONIN PO Take 1 tablet by mouth at bedtime.   Multiple Vitamin (MULTIVITAMIN WITH MINERALS) TABS tablet Take 1 tablet by mouth daily.   pravastatin (PRAVACHOL) 40 MG tablet TAKE 1 TABLET BY MOUTH EVERY DAY   sertraline (ZOLOFT) 100 MG tablet Take 1.5 tablets (150 mg total) by mouth daily.   sildenafil  (VIAGRA) 100 MG tablet Take 50-100 mg by mouth daily as needed.   tamsulosin (FLOMAX) 0.4 MG CAPS capsule Take by mouth.   VITAMIN D PO Take 1 capsule by mouth daily.   No current facility-administered medications for this visit. (Other)      REVIEW OF SYSTEMS: ROS   Positive for: Cardiovascular, Eyes Negative for: Constitutional, Gastrointestinal, Neurological, Skin, Genitourinary, Musculoskeletal, HENT, Endocrine, Respiratory, Psychiatric, Allergic/Imm, Heme/Lymph Last edited by Celine Mans, COA on 12/01/2020  9:44 AM.       ALLERGIES Allergies  Allergen Reactions   Egg White (Diagnostic)     Showed up on allergy test   Penicillins     Did it involve swelling of the face/tongue/throat, SOB, or low BP? No Did it involve sudden or severe rash/hives, skin peeling, or any reaction on the inside of your mouth or nose? No Did you need to seek medical attention at a hospital or doctor's office? Unknown When did it last happen?      Childhood allergy If all above answers are "NO", may proceed with cephalosporin use.    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   Cataract    Mixed form OU   History of skin cancer 04/29/2017   History of skin cancer 04/29/2017   Hypertensive retinopathy    OU   Hypothyroidism 04/29/2017   Itching 04/29/2017   Scabies 05/28/2017   Past Surgical History:  Procedure Laterality Date   PPM GENERATOR CHANGEOUT N/A 06/14/2019   Procedure: PPM GENERATOR CHANGEOUT;  Surgeon: [name], MD;  Location: [facility];  Service: Cardiovascular;  Laterality: N/A;    FAMILY HISTORY Family History  Problem Relation Age of Onset   Hypertension Brother     SOCIAL HISTORY Social History   Tobacco Use   Smoking status: Never   Smokeless tobacco: Never   Substance Use Topics   Alcohol use: Yes   Drug use: No         OPHTHALMIC EXAM:  Base Eye Exam     Visual Acuity (Snellen - Linear)       Right Left   Dist [place] 20/20 -2 20/30 -2   Dist ph [place]  20/20 -2         Tonometry (Tonopen, 9:46 AM)       Right Left   Pressure 13 13         Pupils       Dark Light Shape React APD   Right 3 2 Round Brisk None   Left 3 2 Round Brisk None         Visual Fields (Counting fingers)       Left Right    Full Full         Extraocular Movement       Right Left    Full, Ortho Full, Ortho         Neuro/Psych     Oriented x3: Yes   Mood/Affect: Normal         Dilation     Both eyes: 1.0% Mydriacyl, 2.5% Phenylephrine @ 9:46 AM           Slit Lamp and Fundus Exam     Slit Lamp Exam       Right Left   Lids/Lashes Dermatochalasis - upper lid, Meibomian gland dysfunction, inflammed Hordeolum - lower lid - improving Dermatochalasis - upper lid, Meibomian gland dysfunction   Conjunctiva/Sclera White and quiet White and quiet   Cornea trace Punctate epithelial erosions Trace inferior Punctate epithelial erosions, mild EBMD   Anterior Chamber Deep and quiet Deep and quiet   Iris Round and dilated Round and dilated   Lens 2-3+ Nuclear sclerosis, 2-3+ Cortical cataract 2-3+ Nuclear sclerosis, 2-3+ Cortical cataract   Vitreous Vitreous syneresis, Posterior vitreous detachment, vitreous condensations Vitreous syneresis, Posterior vitreous detachment         Fundus Exam       Right Left   Disc Pink and Sharp Pink and Sharp, mild PPA   C/D Ratio 0.5 0.6   Macula Blunted foveal reflex, focal IRH (improved), +edema IT to fovea -- increased, prominent MA's temporal and inferior to fovea, +focal laser changes Flat, Good foveal reflex, Retinal pigment epithelial mottling, No heme or edema   Vessels attenuated, Tortuous attenuated, Tortuous   Periphery Attached, no heme, RT/RD Attached               IMAGING  AND PROCEDURES  Imaging and Procedures for @TODAY @  OCT, Retina - OU - Both Eyes       Right Eye Quality was good. Central Foveal  Thickness: 387. Progression has worsened. Findings include abnormal foveal contour, intraretinal fluid, no SRF, intraretinal hyper-reflective material (Mild interval increase in IRF, Partial PVD).   Left Eye Quality was good. Central Foveal Thickness: 332. Progression has been stable. Findings include normal foveal contour, no IRF, no SRF (Partial PVD).   Notes *Images captured and stored on drive  Diagnosis / Impression:  OD: BRVO w/ Mild interval increase in IRF, Partial PVD OS: NFP, no IRF/SRF  Clinical management:  See below  Abbreviations: NFP - Normal foveal profile. CME - cystoid macular edema. PED - pigment epithelial detachment. IRF - intraretinal fluid. SRF - subretinal fluid. EZ - ellipsoid zone. ERM - epiretinal membrane. ORA - outer retinal atrophy. ORT - outer retinal tubulation. SRHM - subretinal hyper-reflective material      Intravitreal Injection, Pharmacologic Agent - OD - Right Eye       Time Out 12/01/2020. 10:29 AM. Confirmed correct patient, procedure, site, and patient consented.   Anesthesia Topical anesthesia was used. Anesthetic medications included Lidocaine 2%, Proparacaine 0.5%.   Procedure Preparation included 5% betadine to ocular surface, eyelid speculum. A (32g) needle was used.   Injection: 2 mg aflibercept 2 MG/0.05ML   Route: Intravitreal, Site: Right Eye   NDC: [id number], Lot: [id number], Expiration date: 08/21/2021, Waste: 0.05 mL   Post-op Post injection exam found visual acuity of at least counting fingers. The patient tolerated the procedure well. There were no complications. The patient received written and verbal post procedure care education. Post injection medications were not given.              ASSESSMENT/PLAN:    ICD-10-CM   1. Branch retinal vein occlusion of right eye with macular  edema  H34.8310 Intravitreal Injection, Pharmacologic Agent - OD - Right Eye    aflibercept (EYLEA) SOLN 2 mg    2. Retinal edema  H35.81 OCT, Retina - OU - Both Eyes    3. Essential hypertension  I10     4. Hypertensive retinopathy of both eyes  H35.033     5. Combined forms of age-related cataract of both eyes  H25.813     6. Posterior vitreous detachment of right eye  H43.811       1,2. BRVO with CME OD  - FA 5.14.21 shows +focal telangectasias with late leakage inferior macula consistent with remote inf BRVO  - S/P IVA OD #1 (05.14.21), #2 (06.14.21), #3 (07.16.21) -- IVA resistance  - s/p IVE OD #1 (08.18.21) -- sample, #2 (09.15.21), #3 (10.18.21), #4 (11.17.21), #5 (12.15.21), #6 (01.14.22), #7 (02.11.22), #8 (03.18.22), #9 (04.15.22)  - s/p focal laser OD (05.13.22)  - BCVA 20/15 OD (stable)  - OCT shows mild interval improvement in cystic changes  - recommend IVE OD #10 today, 06.10.22 w/ return in 6 wks  - pt wishes to proceed with injection  - RBA of procedure discussed, questions answered  - see procedure note  - Eylea informed consent form signed and scanned on 08.18.2021  - [email or web address] benefits investigation started, 08.18.21 -- approved as of 09.15.21 -- secondary ins pays -- verified for 2022  - F/U 6 weeks -- DFE/OCT/possible injection  3,4. Hypertensive retinopathy OU  - discussed importance of tight BP control  - monitor   5. Mixed form cataracts OU  - The symptoms of cataract, surgical options, and treatments and risks were discussed with patient.  - discussed diagnosis and progression  - not yet visually significant  - monitor for now  6. Acute PVD /  vitreous syneresis  - Discussed findings and prognosis  - No RT or RD on 360 scleral depressed exam  - Reviewed s/s of RT/RD  - Strict return precautions for any such RT/RD signs/symptoms  - monitor  Ophthalmic Meds Ordered this visit:  Meds ordered this encounter  Medications   aflibercept (EYLEA) SOLN  2 mg       Return in about 6 weeks (around 01/12/2021) for f/y BRVO OD, DFE, OCT.  There are no Patient Instructions on file for this visit.  This document serves as a record of services personally performed by Karie ChimeraBrian G. Lua Feng, MD, PhD. It was created on their behalf by Glee ArvinAmanda J. Manson PasseyBrown, OA an ophthalmic technician. The creation of this record is the provider's dictation and/or activities during the visit.    Electronically signed by: Glee ArvinAmanda J. Manson PasseyBrown, [place] 06.06.2022 1:42 AM  Karie ChimeraBrian G. Klay Sobotka, M.D., Ph.D. Diseases & Surgery of the Retina and Vitreous [facility]  I have reviewed the above documentation for accuracy and completeness, and I agree with the above. Karie ChimeraBrian G. Kareen Hitsman, M.D., Ph.D. 12/03/20 1:42 AM   Abbreviations: M myopia (nearsighted); A astigmatism; H hyperopia (farsighted); P presbyopia; Mrx spectacle prescription;  CTL contact lenses; OD right eye; OS left eye; OU both eyes  XT exotropia; ET esotropia; PEK punctate epithelial keratitis; PEE punctate epithelial erosions; DES dry eye syndrome; MGD meibomian gland dysfunction; ATs artificial tears; PFAT's preservative free artificial tears; NSC nuclear sclerotic cataract; PSC posterior subcapsular cataract; ERM epi-retinal membrane; PVD posterior vitreous detachment; RD retinal detachment; DM diabetes mellitus; DR diabetic retinopathy; NPDR non-proliferative diabetic retinopathy; PDR proliferative diabetic retinopathy; CSME clinically significant macular edema; DME diabetic macular edema; dbh dot blot hemorrhages; CWS cotton wool spot; POAG primary open angle glaucoma; C/D cup-to-disc ratio; HVF humphrey visual field; GVF goldmann visual field; OCT optical coherence tomography; IOP intraocular pressure; BRVO Branch retinal vein occlusion; CRVO central retinal vein occlusion; CRAO central retinal artery occlusion; BRAO branch retinal artery occlusion; RT retinal tear; SB scleral buckle; PPV pars plana vitrectomy; VH  Vitreous hemorrhage; PRP panretinal laser photocoagulation; IVK intravitreal kenalog; VMT vitreomacular traction; MH Macular hole;  NVD neovascularization of the disc; NVE neovascularization elsewhere; AREDS age related eye disease study; ARMD age related macular degeneration; POAG primary open angle glaucoma; EBMD epithelial/anterior basement membrane dystrophy; ACIOL anterior chamber intraocular lens; IOL intraocular lens; PCIOL posterior chamber intraocular lens; Phaco/IOL phacoemulsification with intraocular lens placement; PRK photorefractive keratectomy; LASIK laser assisted in situ keratomileusis; HTN hypertension; DM diabetes mellitus; COPD chronic obstructive pulmonary disease

## 2020-12-01 ENCOUNTER — Other Ambulatory Visit: Payer: Self-pay

## 2020-12-01 ENCOUNTER — Ambulatory Visit (INDEPENDENT_AMBULATORY_CARE_PROVIDER_SITE_OTHER): Payer: Medicare Other | Admitting: Ophthalmology

## 2020-12-01 ENCOUNTER — Encounter (INDEPENDENT_AMBULATORY_CARE_PROVIDER_SITE_OTHER): Payer: Self-pay | Admitting: Ophthalmology

## 2020-12-01 DIAGNOSIS — H34831 Tributary (branch) retinal vein occlusion, right eye, with macular edema: Secondary | ICD-10-CM

## 2020-12-01 DIAGNOSIS — H25813 Combined forms of age-related cataract, bilateral: Secondary | ICD-10-CM

## 2020-12-01 DIAGNOSIS — I10 Essential (primary) hypertension: Secondary | ICD-10-CM | POA: Diagnosis not present

## 2020-12-01 DIAGNOSIS — H00012 Hordeolum externum right lower eyelid: Secondary | ICD-10-CM

## 2020-12-01 DIAGNOSIS — H43811 Vitreous degeneration, right eye: Secondary | ICD-10-CM

## 2020-12-01 DIAGNOSIS — H3581 Retinal edema: Secondary | ICD-10-CM

## 2020-12-01 DIAGNOSIS — H35033 Hypertensive retinopathy, bilateral: Secondary | ICD-10-CM | POA: Diagnosis not present

## 2020-12-03 ENCOUNTER — Encounter (INDEPENDENT_AMBULATORY_CARE_PROVIDER_SITE_OTHER): Payer: Self-pay | Admitting: Ophthalmology

## 2020-12-03 MED ORDER — AFLIBERCEPT 2MG/0.05ML IZ SOLN FOR KALEIDOSCOPE
2.0000 mg | INTRAVITREAL | Status: AC | PRN
  Administered 2020-12-01: 2 mg via INTRAVITREAL

## 2021-01-11 NOTE — Progress Notes (Signed)
[facility] - Clinic Note  01/12/2021     CHIEF COMPLAINT Patient presents for Retina Follow Up   HISTORY OF PRESENT ILLNESS: [name] [name] is a 75 y.o. male who presents to the clinic today for:   HPI     Retina Follow Up   Patient presents with  CRVO/BRVO.  In right eye.  Duration of 6 weeks.  Since onset it is stable.  I, the attending physician,  performed the HPI with the patient and updated documentation appropriately.        Comments   6 week follow up BRVO OD-  Vision appears stable OU.   (Pt has added a unknown pill to relax bladder)      Last edited by [name], MD on 01/12/2021  1:07 PM.      Referring physician: Margot Ables, MD [address] [place],  [place] [address]  HISTORICAL INFORMATION:   Selected notes from the MEDICAL RECORD NUMBER Referred by [name] for concern of vein occlusion OD LEE:  Ocular Hx- PMH-   CURRENT MEDICATIONS: No current outpatient medications on file. (Ophthalmic Drugs)   No current facility-administered medications for this visit. (Ophthalmic Drugs)   Current Outpatient Medications (Other)  Medication Sig   acetaminophen (TYLENOL) 500 MG tablet Take 500-1,000 mg by mouth every 6 (six) hours as needed for moderate pain or headache.   Cyanocobalamin (B-12 PO) Take 1 capsule by mouth daily.   finasteride (PROSCAR) 5 MG tablet Take 5 mg by mouth daily.   levothyroxine (SYNTHROID, LEVOTHROID) 100 MCG tablet TAKE 1 TABLET BY MOUTH ONCE DAILY   MELATONIN PO Take 1 tablet by mouth at bedtime.   Multiple Vitamin (MULTIVITAMIN WITH MINERALS) TABS tablet Take 1 tablet by mouth daily.   pravastatin (PRAVACHOL) 40 MG tablet TAKE 1 TABLET BY MOUTH EVERY DAY   sertraline (ZOLOFT) 100 MG tablet Take 1.5 tablets (150 mg total) by mouth daily.   sildenafil (VIAGRA) 100 MG tablet Take 50-100 mg by mouth daily as needed.   tamsulosin (FLOMAX) 0.4 MG CAPS capsule Take by mouth.    VITAMIN D PO Take 1 capsule by mouth daily.   No current facility-administered medications for this visit. (Other)      REVIEW OF SYSTEMS: ROS   Positive for: Genitourinary, Cardiovascular, Eyes Negative for: Constitutional, Gastrointestinal, Neurological, Skin, Musculoskeletal, HENT, Endocrine, Respiratory, Psychiatric, Allergic/Imm, Heme/Lymph Last edited by Joni Reining, COA on 01/12/2021  9:25 AM.        ALLERGIES Allergies  Allergen Reactions   Egg White (Diagnostic)     Showed up on allergy test   Penicillins     Did it involve swelling of the face/tongue/throat, SOB, or low BP? No Did it involve sudden or severe rash/hives, skin peeling, or any reaction on the inside of your mouth or nose? No Did you need to seek medical attention at a hospital or doctor's office? Unknown When did it last happen?      Childhood allergy If all above answers are "NO", may proceed with cephalosporin use.    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   Cataract    Mixed form OU   History of skin cancer 04/29/2017   History of skin cancer  04/29/2017   Hypertensive retinopathy    OU   Hypothyroidism 04/29/2017   Itching 04/29/2017   Scabies 05/28/2017   Past Surgical History:  Procedure Laterality Date   PPM GENERATOR CHANGEOUT N/A 06/14/2019   Procedure: PPM GENERATOR CHANGEOUT;  Surgeon: Marinus Mawaylor, Gregg W, MD;  Location: [facility];  Service: Cardiovascular;  Laterality: N/A;    FAMILY HISTORY Family History  Problem Relation Age of Onset   Hypertension Brother     SOCIAL HISTORY Social History   Tobacco Use   Smoking status: Never   Smokeless tobacco: Never  Substance Use Topics   Alcohol use: Yes   Drug use: No         OPHTHALMIC EXAM:  Base Eye Exam     Visual Acuity  (Snellen - Linear)       Right Left   Dist [place] 20/20- 20/30-   Dist ph [place]  20/20         Tonometry (Tonopen, 9:30 AM)       Right Left   Pressure 15 12         Pupils       Dark Light Shape React APD   Right 3 2 Round Brisk None   Left 3 2 Round Brisk None         Visual Fields (Counting fingers)       Left Right    Full Full         Extraocular Movement       Right Left    Full Full         Neuro/Psych     Oriented x3: Yes   Mood/Affect: Normal         Dilation     Both eyes: 2.5% Phenylephrine, 1.0% Mydriacyl @ 9:30 AM           Slit Lamp and Fundus Exam     Slit Lamp Exam       Right Left   Lids/Lashes Dermatochalasis - upper lid, Meibomian gland dysfunction, inflammed Hordeolum - lower lid - improving Dermatochalasis - upper lid, Meibomian gland dysfunction   Conjunctiva/Sclera White and quiet White and quiet   Cornea trace Punctate epithelial erosions Trace inferior Punctate epithelial erosions, mild EBMD   Anterior Chamber Deep and quiet Deep and quiet   Iris Round and dilated Round and dilated   Lens 2-3+ Nuclear sclerosis, 2-3+ Cortical cataract 2-3+ Nuclear sclerosis, 2-3+ Cortical cataract   Vitreous Vitreous syneresis, Posterior vitreous detachment, vitreous condensations Vitreous syneresis, Posterior vitreous detachment         Fundus Exam       Right Left   Disc Pink and Sharp Pink and Sharp, mild PPA, mild, focal PPP temporal   C/D Ratio 0.5 0.6   Macula Blunted foveal reflex, focal IRH (improved), +edema IT to fovea -- improved, prominent MA's temporal and inferior to fovea -- improved, +focal laser changes Flat, Good foveal reflex, Retinal pigment epithelial mottling, No heme or edema   Vessels attenuated, Tortuous attenuated, Tortuous   Periphery Attached, no heme, RT/RD Attached               IMAGING AND PROCEDURES  Imaging and Procedures for @TODAY @  OCT, Retina - OU - Both Eyes       Right Eye Quality  was good. Central Foveal Thickness: 358. Progression has improved. Findings include abnormal foveal contour, intraretinal fluid, no SRF, intraretinal hyper-reflective material (Mild interval improvement in IRF, Partial PVD).  Left Eye Quality was good. Central Foveal Thickness: 329. Progression has been stable. Findings include normal foveal contour, no IRF, no SRF (Partial PVD).   Notes *Images captured and stored on drive  Diagnosis / Impression:  OD: BRVO w/ Mild interval decrease in IRF, Partial PVD OS: NFP, no IRF/SRF  Clinical management:  See below  Abbreviations: NFP - Normal foveal profile. CME - cystoid macular edema. PED - pigment epithelial detachment. IRF - intraretinal fluid. SRF - subretinal fluid. EZ - ellipsoid zone. ERM - epiretinal membrane. ORA - outer retinal atrophy. ORT - outer retinal tubulation. SRHM - subretinal hyper-reflective material      Intravitreal Injection, Pharmacologic Agent - OD - Right Eye       Time Out 01/12/2021. 10:28 AM. Confirmed correct patient, procedure, site, and patient consented.   Anesthesia Topical anesthesia was used. Anesthetic medications included Lidocaine 2%, Proparacaine 0.5%.   Procedure Preparation included 5% betadine to ocular surface, eyelid speculum. A (32g) needle was used.   Injection: 2 mg aflibercept 2 MG/0.05ML   Route: Intravitreal, Site: Right Eye   NDC: L6038910, Lot: [id number], Expiration date: 09/22/2021, Waste: 0.05 mL   Post-op Post injection exam found visual acuity of at least counting fingers. The patient tolerated the procedure well. There were no complications. The patient received written and verbal post procedure care education. Post injection medications were not given.            ASSESSMENT/PLAN:    ICD-10-CM   1. Branch retinal vein occlusion of right eye with macular edema  H34.8310 Intravitreal Injection, Pharmacologic Agent - OD - Right Eye    aflibercept (EYLEA) SOLN 2 mg     2. Retinal edema  H35.81 OCT, Retina - OU - Both Eyes    3. Essential hypertension  I10     4. Hypertensive retinopathy of both eyes  H35.033     5. Combined forms of age-related cataract of both eyes  H25.813     6. Posterior vitreous detachment of right eye  H43.811      1,2. BRVO with CME OD  - FA 5.14.21 shows +focal telangectasias with late leakage inferior macula consistent with remote inf BRVO  - S/P IVA OD #1 (05.14.21), #2 (06.14.21), #3 (07.16.21) -- IVA resistance  - s/p IVE OD #1 (08.18.21) -- sample, #2 (09.15.21), #3 (10.18.21), #4 (11.17.21), #5 (12.15.21), #6 (01.14.22), #7 (02.11.22), #8 (03.18.22), #9 (04.15.22), #10 (06.10.22)  - s/p focal laser OD (05.13.22)  - BCVA 20/20 OD (stable)  - OCT shows mild interval improvement in cystic changes at 6 weeks  - recommend IVE OD #11 today, 07.22.22 w/ return in 7 wks  - pt wishes to proceed with injection  - RBA of procedure discussed, questions answered  - see procedure note  - Eylea informed consent form signed and scanned on 08.18.2021  - [email or web address] benefits investigation started, 08.18.21 -- approved as of 09.15.21 -- secondary ins pays -- verified for 2022  - F/U 7 weeks -- DFE/OCT/possible injection  3,4. Hypertensive retinopathy OU  - discussed importance of tight BP control  - monitor   5. Mixed form cataracts OU  - The symptoms of cataract, surgical options, and treatments and risks were discussed with patient.  - discussed diagnosis and progression  - not yet visually significant  - monitor for now  6. Acute PVD / vitreous syneresis  - Discussed findings and prognosis  - No RT or RD on 360 scleral depressed exam  - Reviewed s/s  of RT/RD  - Strict return precautions for any such RT/RD signs/symptoms  - monitor  Ophthalmic Meds Ordered this visit:  Meds ordered this encounter  Medications   aflibercept (EYLEA) SOLN 2 mg      Return in about 7 weeks (around 03/02/2021) for f/u BRVO OD, DFE,  OCT.  There are no Patient Instructions on file for this visit.  This document serves as a record of services personally performed by Karie Chimera, MD, PhD. It was created on their behalf by Glee Arvin. Manson Passey, OA an ophthalmic technician. The creation of this record is the provider's dictation and/or activities during the visit.    Electronically signed by: Glee Arvin. Manson Passey, [place] 07.21.2022 1:14 PM  Karie Chimera, M.D., Ph.D. Diseases & Surgery of the Retina and Vitreous [facility]  I have reviewed the above documentation for accuracy and completeness, and I agree with the above. Karie Chimera, M.D., Ph.D. 01/12/21 1:14 PM   Abbreviations: M myopia (nearsighted); A astigmatism; H hyperopia (farsighted); P presbyopia; Mrx spectacle prescription;  CTL contact lenses; OD right eye; OS left eye; OU both eyes  XT exotropia; ET esotropia; PEK punctate epithelial keratitis; PEE punctate epithelial erosions; DES dry eye syndrome; MGD meibomian gland dysfunction; ATs artificial tears; PFAT's preservative free artificial tears; NSC nuclear sclerotic cataract; PSC posterior subcapsular cataract; ERM epi-retinal membrane; PVD posterior vitreous detachment; RD retinal detachment; DM diabetes mellitus; DR diabetic retinopathy; NPDR non-proliferative diabetic retinopathy; PDR proliferative diabetic retinopathy; CSME clinically significant macular edema; DME diabetic macular edema; dbh dot blot hemorrhages; CWS cotton wool spot; POAG primary open angle glaucoma; C/D cup-to-disc ratio; HVF humphrey visual field; GVF goldmann visual field; OCT optical coherence tomography; IOP intraocular pressure; BRVO Branch retinal vein occlusion; CRVO central retinal vein occlusion; CRAO central retinal artery occlusion; BRAO branch retinal artery occlusion; RT retinal tear; SB scleral buckle; PPV pars plana vitrectomy; VH Vitreous hemorrhage; PRP panretinal laser photocoagulation; IVK intravitreal  kenalog; VMT vitreomacular traction; MH Macular hole;  NVD neovascularization of the disc; NVE neovascularization elsewhere; AREDS age related eye disease study; ARMD age related macular degeneration; POAG primary open angle glaucoma; EBMD epithelial/anterior basement membrane dystrophy; ACIOL anterior chamber intraocular lens; IOL intraocular lens; PCIOL posterior chamber intraocular lens; Phaco/IOL phacoemulsification with intraocular lens placement; PRK photorefractive keratectomy; LASIK laser assisted in situ keratomileusis; HTN hypertension; DM diabetes mellitus; COPD chronic obstructive pulmonary disease

## 2021-01-12 ENCOUNTER — Encounter (INDEPENDENT_AMBULATORY_CARE_PROVIDER_SITE_OTHER): Payer: Self-pay | Admitting: Ophthalmology

## 2021-01-12 ENCOUNTER — Ambulatory Visit (INDEPENDENT_AMBULATORY_CARE_PROVIDER_SITE_OTHER): Payer: Medicare Other | Admitting: Ophthalmology

## 2021-01-12 ENCOUNTER — Other Ambulatory Visit: Payer: Self-pay

## 2021-01-12 ENCOUNTER — Ambulatory Visit (INDEPENDENT_AMBULATORY_CARE_PROVIDER_SITE_OTHER): Payer: Medicare Other

## 2021-01-12 DIAGNOSIS — H35033 Hypertensive retinopathy, bilateral: Secondary | ICD-10-CM | POA: Diagnosis not present

## 2021-01-12 DIAGNOSIS — I10 Essential (primary) hypertension: Secondary | ICD-10-CM

## 2021-01-12 DIAGNOSIS — H34831 Tributary (branch) retinal vein occlusion, right eye, with macular edema: Secondary | ICD-10-CM

## 2021-01-12 DIAGNOSIS — I442 Atrioventricular block, complete: Secondary | ICD-10-CM | POA: Diagnosis not present

## 2021-01-12 DIAGNOSIS — H25813 Combined forms of age-related cataract, bilateral: Secondary | ICD-10-CM

## 2021-01-12 DIAGNOSIS — H43811 Vitreous degeneration, right eye: Secondary | ICD-10-CM

## 2021-01-12 DIAGNOSIS — H3581 Retinal edema: Secondary | ICD-10-CM

## 2021-01-12 MED ORDER — AFLIBERCEPT 2MG/0.05ML IZ SOLN FOR KALEIDOSCOPE
2.0000 mg | INTRAVITREAL | Status: AC | PRN
  Administered 2021-01-12: 2 mg via INTRAVITREAL

## 2021-01-15 LAB — CUP PACEART REMOTE DEVICE CHECK
Battery Remaining Longevity: 102 mo
Battery Remaining Percentage: 87 %
Battery Voltage: 3.02 V
Brady Statistic AP VP Percent: 45 %
Brady Statistic AP VS Percent: 1 %
Brady Statistic AS VP Percent: 55 %
Brady Statistic AS VS Percent: 1 %
Brady Statistic RA Percent Paced: 45 %
Brady Statistic RV Percent Paced: 99 %
Date Time Interrogation Session: 20220722020018
Implantable Lead Implant Date: 20101229
Implantable Lead Implant Date: 20101229
Implantable Lead Location: 753859
Implantable Lead Location: 753860
Implantable Pulse Generator Implant Date: 20201221
Lead Channel Impedance Value: 440 Ohm
Lead Channel Impedance Value: 530 Ohm
Lead Channel Pacing Threshold Amplitude: 0.75 V
Lead Channel Pacing Threshold Amplitude: 0.75 V
Lead Channel Pacing Threshold Pulse Width: 0.5 ms
Lead Channel Pacing Threshold Pulse Width: 0.5 ms
Lead Channel Sensing Intrinsic Amplitude: 11.4 mV
Lead Channel Sensing Intrinsic Amplitude: 3.9 mV
Lead Channel Setting Pacing Amplitude: 1 V
Lead Channel Setting Pacing Amplitude: 1.75 V
Lead Channel Setting Pacing Pulse Width: 0.5 ms
Lead Channel Setting Sensing Sensitivity: 4 mV
Pulse Gen Model: 2272
Pulse Gen Serial Number: 9190527

## 2021-02-05 NOTE — Progress Notes (Signed)
Remote pacemaker transmission.   

## 2021-03-01 NOTE — Progress Notes (Signed)
[facility] - Clinic Note  03/02/2021     CHIEF COMPLAINT Patient presents for Retina Follow Up   HISTORY OF PRESENT ILLNESS: [name] [name] is a 75 y.o. male who presents to the clinic today for:   HPI     Retina Follow Up   Patient presents with  CRVO/BRVO.  In right eye.  This started 7 weeks ago.  I, the attending physician,  performed the HPI with the patient and updated documentation appropriately.        Comments   Patient here for 7 weeks retina follow up for BRVO OD. Patient states vision doing fine. No eye pain.       Last edited by [name], MD on 03/02/2021  9:04 AM.    Pt states no change in vision   Referring physician: Margot Ables, MD [address] [place],  [place] [address]  HISTORICAL INFORMATION:   Selected notes from the MEDICAL RECORD NUMBER Referred by [name] for concern of vein occlusion OD LEE:  Ocular Hx- PMH-   CURRENT MEDICATIONS: No current outpatient medications on file. (Ophthalmic Drugs)   No current facility-administered medications for this visit. (Ophthalmic Drugs)   Current Outpatient Medications (Other)  Medication Sig   acetaminophen (TYLENOL) 500 MG tablet Take 500-1,000 mg by mouth every 6 (six) hours as needed for moderate pain or headache.   Cyanocobalamin (B-12 PO) Take 1 capsule by mouth daily.   finasteride (PROSCAR) 5 MG tablet Take 5 mg by mouth daily.   levothyroxine (SYNTHROID, LEVOTHROID) 100 MCG tablet TAKE 1 TABLET BY MOUTH ONCE DAILY   MELATONIN PO Take 1 tablet by mouth at bedtime.   Multiple Vitamin (MULTIVITAMIN WITH MINERALS) TABS tablet Take 1 tablet by mouth daily.   pravastatin (PRAVACHOL) 40 MG tablet TAKE 1 TABLET BY MOUTH EVERY DAY   sertraline (ZOLOFT) 100 MG tablet Take 1.5 tablets (150 mg total) by mouth daily.   sildenafil (VIAGRA) 100 MG tablet Take 50-100 mg by mouth daily as needed.   tamsulosin (FLOMAX) 0.4 MG CAPS capsule Take by mouth.    VITAMIN D PO Take 1 capsule by mouth daily.   No current facility-administered medications for this visit. (Other)    REVIEW OF SYSTEMS: ROS   Positive for: Genitourinary, Cardiovascular, Eyes Negative for: Constitutional, Gastrointestinal, Neurological, Skin, Musculoskeletal, HENT, Endocrine, Respiratory, Psychiatric, Allergic/Imm, Heme/Lymph Last edited by Laddie Aquas, COA on 03/02/2021  8:40 AM.      ALLERGIES Allergies  Allergen Reactions   Egg White (Diagnostic)     Showed up on allergy test   Penicillins     Did it involve swelling of the face/tongue/throat, SOB, or low BP? No Did it involve sudden or severe rash/hives, skin peeling, or any reaction on the inside of your mouth or nose? No Did you need to seek medical attention at a hospital or doctor's office? Unknown When did it last happen?      Childhood allergy If all above answers are "NO", may proceed with cephalosporin use.    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   Cataract    Mixed form OU   History of skin cancer 04/29/2017   History of skin cancer 04/29/2017   Hypertensive  retinopathy    OU   Hypothyroidism 04/29/2017   Itching 04/29/2017   Scabies 05/28/2017   Past Surgical History:  Procedure Laterality Date   PPM GENERATOR CHANGEOUT N/A 06/14/2019   Procedure: PPM GENERATOR CHANGEOUT;  Surgeon: [name], MD;  Location: [facility] INVASIVE CV [facility];  Service: Cardiovascular;  Laterality: N/A;    FAMILY HISTORY Family History  Problem Relation Age of Onset   Hypertension Brother     SOCIAL HISTORY Social History   Tobacco Use   Smoking status: Never   Smokeless tobacco: Never  Substance Use Topics   Alcohol use: Yes   Drug use: No         OPHTHALMIC EXAM:  Base Eye Exam     Visual Acuity  (Snellen - Linear)       Right Left   Dist [place] 20/20 20/25 -2   Dist ph [place]  20/20         Tonometry (Tonopen, 8:38 AM)       Right Left   Pressure 15 13         Pupils       Dark Light Shape React APD   Right 3 2 Round Brisk None   Left 3 2 Round Brisk None         Visual Fields (Counting fingers)       Left Right    Full Full         Extraocular Movement       Right Left    Full Full         Neuro/Psych     Oriented x3: Yes   Mood/Affect: Normal         Dilation     Both eyes: 1.0% Mydriacyl, 2.5% Phenylephrine @ 8:37 AM           Slit Lamp and Fundus Exam     Slit Lamp Exam       Right Left   Lids/Lashes Dermatochalasis - upper lid, Meibomian gland dysfunction, inflammed Hordeolum - lower lid - improving Dermatochalasis - upper lid, Meibomian gland dysfunction   Conjunctiva/Sclera White and quiet White and quiet   Cornea trace Punctate epithelial erosions Trace inferior Punctate epithelial erosions, mild EBMD   Anterior Chamber Deep and quiet Deep and quiet   Iris Round and dilated Round and dilated   Lens 2-3+ Nuclear sclerosis, 2-3+ Cortical cataract 2-3+ Nuclear sclerosis, 2-3+ Cortical cataract   Vitreous Vitreous syneresis, Posterior vitreous detachment, vitreous condensations Vitreous syneresis, Posterior vitreous detachment         Fundus Exam       Right Left   Disc Pink and Sharp Pink and Sharp, mild PPA, mild, focal PPP temporal   C/D Ratio 0.5 0.6   Macula Blunted foveal reflex, focal IRH (improved), +edema IT to fovea -- improved, prominent MA's temporal and inferior to fovea -- improved, +focal laser changes Flat, Good foveal reflex, Retinal pigment epithelial mottling, No heme or edema   Vessels attenuated, Tortuous attenuated, Tortuous   Periphery Attached, no heme, RT/RD Attached               IMAGING AND PROCEDURES  Imaging and Procedures for @TODAY @  OCT, Retina - OU - Both Eyes       Right Eye Quality  was good. Central Foveal Thickness: 359. Progression has been stable. Findings include abnormal foveal contour, intraretinal fluid, no SRF, intraretinal hyper-reflective material (Persistent cystic changes - slightly decreased, Partial PVD).   Left  Eye Quality was good. Central Foveal Thickness: 328. Progression has been stable. Findings include normal foveal contour, no IRF, no SRF (Partial PVD).   Notes *Images captured and stored on drive  Diagnosis / Impression:  OD: BRVO w/ Persistent cystic changes - slightly decreased, Partial PVD OS: NFP, no IRF/SRF  Clinical management:  See below  Abbreviations: NFP - Normal foveal profile. CME - cystoid macular edema. PED - pigment epithelial detachment. IRF - intraretinal fluid. SRF - subretinal fluid. EZ - ellipsoid zone. ERM - epiretinal membrane. ORA - outer retinal atrophy. ORT - outer retinal tubulation. SRHM - subretinal hyper-reflective material      Intravitreal Injection, Pharmacologic Agent - OD - Right Eye       Time Out 03/02/2021. 9:07 AM. Confirmed correct patient, procedure, site, and patient consented.   Anesthesia Topical anesthesia was used. Anesthetic medications included Lidocaine 2%, Proparacaine 0.5%.   Procedure Preparation included 5% betadine to ocular surface, eyelid speculum. A (32g) needle was used.   Injection: 2 mg aflibercept 2 MG/0.05ML   Route: Intravitreal, Site: Right Eye   NDC: L6038910, Lot: [id number], Expiration date: 03/21/2021, Waste: 0.05 mL   Post-op Post injection exam found visual acuity of at least counting fingers. The patient tolerated the procedure well. There were no complications. The patient received written and verbal post procedure care education. Post injection medications were not given.            ASSESSMENT/PLAN:    ICD-10-CM   1. Branch retinal vein occlusion of right eye with macular edema  H34.8310 Intravitreal Injection, Pharmacologic Agent - OD - Right Eye     aflibercept (EYLEA) SOLN 2 mg    2. Retinal edema  H35.81 OCT, Retina - OU - Both Eyes    3. Essential hypertension  I10     4. Hypertensive retinopathy of both eyes  H35.033     5. Combined forms of age-related cataract of both eyes  H25.813     6. Posterior vitreous detachment of right eye  H43.811      1,2. BRVO with CME OD  - FA 5.14.21 shows +focal telangectasias with late leakage inferior macula consistent with remote inf BRVO  - S/P IVA OD #1 (05.14.21), #2 (06.14.21), #3 (07.16.21) -- IVA resistance  - s/p IVE OD #1 (08.18.21) -- sample, #2 (09.15.21), #3 (10.18.21), #4 (11.17.21), #5 (12.15.21), #6 (01.14.22), #7 (02.11.22), #8 (03.18.22), #9 (04.15.22), #10 (06.10.22), #11 (07.22.22)  - s/p focal laser OD (05.13.22)  - BCVA 20/20 OD (stable)  - OCT shows mild interval improvement in cystic changes at 7 weeks  - recommend IVE OD #12 today, 09.09.22 w/ ext return in 8 wks  - pt wishes to proceed with injection  - RBA of procedure discussed, questions answered  - see procedure note  - Eylea informed consent form signed and scanned on 08.18.2021  - [email or web address] benefits investigation started, 08.18.21 -- approved as of 09.15.21 -- secondary ins pays -- verified for 2022  - F/U 8 weeks -- DFE/OCT/possible injection  3,4. Hypertensive retinopathy OU  - discussed importance of tight BP control  - monitor    5. Mixed form cataracts OU  - The symptoms of cataract, surgical options, and treatments and risks were discussed with patient.  - discussed diagnosis and progression  - not yet visually significant  - monitor for now  6. PVD / vitreous syneresis  - Discussed findings and prognosis  - No RT or RD on 360 scleral depressed exam  -  Reviewed s/s of RT/RD  - Strict return precautions for any such RT/RD signs/symptoms  - monitor   Ophthalmic Meds Ordered this visit:  Meds ordered this encounter  Medications   aflibercept (EYLEA) SOLN 2 mg       Return in about 8 weeks  (around 04/27/2021) for f/u 8 weeks, BRVO OD, DFE, OCT.  There are no Patient Instructions on file for this visit.  This document serves as a record of services personally performed by Karie ChimeraBrian G. Shianne Zeiser, MD, PhD. It was created on their behalf by Joni Reiningobin Hodges, an ophthalmic technician. The creation of this record is the provider's dictation and/or activities during the visit.    Electronically signed by: Joni Reiningobin Hodges COA, 03/02/21  12:32 PM   Karie ChimeraBrian G. Lesette Frary, M.D., Ph.D. Diseases & Surgery of the Retina and Vitreous [facility] 03/02/2021  I have reviewed the above documentation for accuracy and completeness, and I agree with the above. Karie ChimeraBrian G. Gee Habig, M.D., Ph.D. 03/02/21 12:32 PM  Abbreviations: M myopia (nearsighted); A astigmatism; H hyperopia (farsighted); P presbyopia; Mrx spectacle prescription;  CTL contact lenses; OD right eye; OS left eye; OU both eyes  XT exotropia; ET esotropia; PEK punctate epithelial keratitis; PEE punctate epithelial erosions; DES dry eye syndrome; MGD meibomian gland dysfunction; ATs artificial tears; PFAT's preservative free artificial tears; NSC nuclear sclerotic cataract; PSC posterior subcapsular cataract; ERM epi-retinal membrane; PVD posterior vitreous detachment; RD retinal detachment; DM diabetes mellitus; DR diabetic retinopathy; NPDR non-proliferative diabetic retinopathy; PDR proliferative diabetic retinopathy; CSME clinically significant macular edema; DME diabetic macular edema; dbh dot blot hemorrhages; CWS cotton wool spot; POAG primary open angle glaucoma; C/D cup-to-disc ratio; HVF humphrey visual field; GVF goldmann visual field; OCT optical coherence tomography; IOP intraocular pressure; BRVO Branch retinal vein occlusion; CRVO central retinal vein occlusion; CRAO central retinal artery occlusion; BRAO branch retinal artery occlusion; RT retinal tear; SB scleral buckle; PPV pars plana vitrectomy; VH Vitreous hemorrhage; PRP  panretinal laser photocoagulation; IVK intravitreal kenalog; VMT vitreomacular traction; MH Macular hole;  NVD neovascularization of the disc; NVE neovascularization elsewhere; AREDS age related eye disease study; ARMD age related macular degeneration; POAG primary open angle glaucoma; EBMD epithelial/anterior basement membrane dystrophy; ACIOL anterior chamber intraocular lens; IOL intraocular lens; PCIOL posterior chamber intraocular lens; Phaco/IOL phacoemulsification with intraocular lens placement; PRK photorefractive keratectomy; LASIK laser assisted in situ keratomileusis; HTN hypertension; DM diabetes mellitus; COPD chronic obstructive pulmonary disease

## 2021-03-02 ENCOUNTER — Encounter (INDEPENDENT_AMBULATORY_CARE_PROVIDER_SITE_OTHER): Payer: Medicare Other | Admitting: Ophthalmology

## 2021-03-02 ENCOUNTER — Ambulatory Visit (INDEPENDENT_AMBULATORY_CARE_PROVIDER_SITE_OTHER): Payer: Medicare Other | Admitting: Ophthalmology

## 2021-03-02 ENCOUNTER — Other Ambulatory Visit: Payer: Self-pay

## 2021-03-02 ENCOUNTER — Encounter (INDEPENDENT_AMBULATORY_CARE_PROVIDER_SITE_OTHER): Payer: Self-pay | Admitting: Ophthalmology

## 2021-03-02 DIAGNOSIS — H34831 Tributary (branch) retinal vein occlusion, right eye, with macular edema: Secondary | ICD-10-CM | POA: Diagnosis not present

## 2021-03-02 DIAGNOSIS — H35033 Hypertensive retinopathy, bilateral: Secondary | ICD-10-CM | POA: Diagnosis not present

## 2021-03-02 DIAGNOSIS — H3581 Retinal edema: Secondary | ICD-10-CM

## 2021-03-02 DIAGNOSIS — I10 Essential (primary) hypertension: Secondary | ICD-10-CM | POA: Diagnosis not present

## 2021-03-02 DIAGNOSIS — H43811 Vitreous degeneration, right eye: Secondary | ICD-10-CM

## 2021-03-02 DIAGNOSIS — H25813 Combined forms of age-related cataract, bilateral: Secondary | ICD-10-CM

## 2021-03-02 MED ORDER — AFLIBERCEPT 2MG/0.05ML IZ SOLN FOR KALEIDOSCOPE
2.0000 mg | INTRAVITREAL | Status: AC | PRN
  Administered 2021-03-02: 2 mg via INTRAVITREAL

## 2021-04-13 ENCOUNTER — Telehealth: Payer: Self-pay

## 2021-04-13 ENCOUNTER — Ambulatory Visit (INDEPENDENT_AMBULATORY_CARE_PROVIDER_SITE_OTHER): Payer: Medicare Other

## 2021-04-13 DIAGNOSIS — I442 Atrioventricular block, complete: Secondary | ICD-10-CM | POA: Diagnosis not present

## 2021-04-13 NOTE — Telephone Encounter (Signed)
[name] [name] 75 year old male is requesting preoperative cardiac evaluation for biopsy of lesion from left buccal mucosa.  He was last seen in the clinic on 10/25/2020.  During that time he denied chest pain and shortness of breath.  His PPM was working/functioning well at that time.  His PMH includes CHB status post PPM insertion, PPM changed out 06/14/2019, dyslipidemia, skin cancer, and hypothyroidism.  May his aspirin be held prior to his procedure?  Thank you for your help.  Please direct response to CV DIV preop pool.  Thomasene Ripple. Maxcine Strong NP-C    04/13/2021, 3:34 PM [facility] [address] Office [phone] Fax [phone]

## 2021-04-13 NOTE — Telephone Encounter (Signed)
   [facility] Pre-operative Risk Assessment    Patient Name: [name] [name]  DOB: [birth year] MRN: [id number]  HEARTCARE STAFF:  - IMPORTANT!!!!!! Under Visit Info/Reason for Call, type in Other and utilize the format Clearance MM/DD/YY or Clearance TBD. Do not use dashes or single digits. - Please review there is not already an duplicate clearance open for this procedure. - If request is for dental extraction, please clarify the # of teeth to be extracted. - If the patient is currently at the dentist's office, call Pre-Op Callback Staff (MA/nurse) to input urgent request.  - If the patient is not currently in the dentist office, please route to the Pre-Op pool.  Request for surgical clearance:  What type of surgery is being performed? BIOPSY OF LESION AT THE COMMISURE OF THE LEFT BUCCAL [place]  When is this surgery scheduled? TBD  What type of clearance is required (medical clearance vs. Pharmacy clearance to hold med vs. Both)? PHARMACY  Are there any medications that need to be held prior to surgery and how long? ASA 81MG BID - NEEDS INSTRUCTIONS ON HOW LONG PT NEEDS TO HOLD  Practice name and name of physician performing surgery? [facility] What is the office phone number? [phone]   7.   What is the office fax number? [phone]  8.   Anesthesia type (None, local, MAC, general) ? NONE LISTED   Jacinta Shoe 04/13/2021, 3:14 PM  _________________________________________________________________   (provider comments below)

## 2021-04-15 LAB — CUP PACEART REMOTE DEVICE CHECK
Battery Remaining Longevity: 101 mo
Battery Remaining Percentage: 85 %
Battery Voltage: 3.02 V
Brady Statistic AP VP Percent: 45 %
Brady Statistic AP VS Percent: 1 %
Brady Statistic AS VP Percent: 55 %
Brady Statistic AS VS Percent: 1 %
Brady Statistic RA Percent Paced: 45 %
Brady Statistic RV Percent Paced: 99 %
Date Time Interrogation Session: 20221021020025
Implantable Lead Implant Date: 20101229
Implantable Lead Implant Date: 20101229
Implantable Lead Location: 753859
Implantable Lead Location: 753860
Implantable Pulse Generator Implant Date: 20201221
Lead Channel Impedance Value: 430 Ohm
Lead Channel Impedance Value: 530 Ohm
Lead Channel Pacing Threshold Amplitude: 0.625 V
Lead Channel Pacing Threshold Amplitude: 0.625 V
Lead Channel Pacing Threshold Pulse Width: 0.5 ms
Lead Channel Pacing Threshold Pulse Width: 0.5 ms
Lead Channel Sensing Intrinsic Amplitude: 11.4 mV
Lead Channel Sensing Intrinsic Amplitude: 3.1 mV
Lead Channel Setting Pacing Amplitude: 0.875
Lead Channel Setting Pacing Amplitude: 1.625
Lead Channel Setting Pacing Pulse Width: 0.5 ms
Lead Channel Setting Sensing Sensitivity: 4 mV
Pulse Gen Model: 2272
Pulse Gen Serial Number: 9190527

## 2021-04-19 NOTE — Progress Notes (Signed)
[facility] - Clinic Note  04/27/2021     CHIEF COMPLAINT Patient presents for Retina Follow Up   HISTORY OF PRESENT ILLNESS: [name] [name] is a 75 y.o. male who presents to the clinic today for:   HPI     Retina Follow Up   Patient presents with  CRVO/BRVO.  In right eye.  Duration of 8 weeks.  Since onset it is stable.  I, the attending physician,  performed the HPI with the patient and updated documentation appropriately.        Comments   8 week follow up BRVO OD-  No changes with eyes since last visit.  Vision appears stable OU.       Last edited by [name], MD on 04/27/2021  9:46 AM.     Pt states   Referring physician: Margot Ables, MD [address] [place],  [place] [address]  HISTORICAL INFORMATION:   Selected notes from the MEDICAL RECORD NUMBER Referred by [name] for concern of vein occlusion OD LEE:  Ocular Hx- PMH-   CURRENT MEDICATIONS: No current outpatient medications on file. (Ophthalmic Drugs)   No current facility-administered medications for this visit. (Ophthalmic Drugs)   Current Outpatient Medications (Other)  Medication Sig   acetaminophen (TYLENOL) 500 MG tablet Take 500-1,000 mg by mouth every 6 (six) hours as needed for moderate pain or headache.   Cyanocobalamin (B-12 PO) Take 1 capsule by mouth daily.   finasteride (PROSCAR) 5 MG tablet Take 5 mg by mouth daily.   levothyroxine (SYNTHROID, LEVOTHROID) 100 MCG tablet TAKE 1 TABLET BY MOUTH ONCE DAILY   MELATONIN PO Take 1 tablet by mouth at bedtime.   Multiple Vitamin (MULTIVITAMIN WITH MINERALS) TABS tablet Take 1 tablet by mouth daily.   pravastatin (PRAVACHOL) 40 MG tablet TAKE 1 TABLET BY MOUTH EVERY DAY   sertraline (ZOLOFT) 100 MG tablet Take 1.5 tablets (150 mg total) by mouth daily.   sildenafil (VIAGRA) 100 MG tablet Take 50-100 mg by mouth daily as needed.   tamsulosin (FLOMAX) 0.4 MG CAPS capsule Take by mouth.    VITAMIN D PO Take 1 capsule by mouth daily.   No current facility-administered medications for this visit. (Other)    REVIEW OF SYSTEMS: ROS   Positive for: Genitourinary, Cardiovascular, Eyes Negative for: Constitutional, Gastrointestinal, Neurological, Skin, Musculoskeletal, HENT, Endocrine, Respiratory, Psychiatric, Allergic/Imm, Heme/Lymph Last edited by Joni Reining, COA on 04/27/2021  8:34 AM.      ALLERGIES Allergies  Allergen Reactions   Egg White (Diagnostic)     Showed up on allergy test   Penicillins     Did it involve swelling of the face/tongue/throat, SOB, or low BP? No Did it involve sudden or severe rash/hives, skin peeling, or any reaction on the inside of your mouth or nose? No Did you need to seek medical attention at a hospital or doctor's office? Unknown When did it last happen?      Childhood allergy If all above answers are "NO", may proceed with cephalosporin use.    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   Cataract    Mixed form OU   History of skin cancer 04/29/2017   History of skin cancer 04/29/2017  Hypertensive retinopathy    OU   Hypothyroidism 04/29/2017   Itching 04/29/2017   Scabies 05/28/2017   Past Surgical History:  Procedure Laterality Date   PPM GENERATOR CHANGEOUT N/A 06/14/2019   Procedure: PPM GENERATOR CHANGEOUT;  Surgeon: [name], MD;  Location: [facility]VAS[facility];  Service: Cardiovascular;  Laterality: N/A;   FAMILY HISTORY Family History  Problem Relation Age of Onset   Hypertension Brother    SOCIAL HISTORY Social History   Tobacco Use   Smoking status: Never   Smokeless tobacco: Never  Substance Use Topics   Alcohol use: Yes   Drug use: No       OPHTHALMIC EXAM: Base Eye Exam     Visual Acuity (Snellen - Linear)        Right Left   Dist [place] 20/20 20/25 -1   Dist ph [place]  20/20         Tonometry (Tonopen, 8:39 AM)       Right Left   Pressure 16 18         Pupils       Dark Light Shape React APD   Right 3 2 Round Brisk None   Left 3 2 Round Brisk None         Visual Fields (Counting fingers)       Left Right    Full Full         Extraocular Movement       Right Left    Full Full         Neuro/Psych     Oriented x3: Yes   Mood/Affect: Normal         Dilation     Both eyes: 1.0% Mydriacyl, 2.5% Phenylephrine @ 8:39 AM           Slit Lamp and Fundus Exam     Slit Lamp Exam       Right Left   Lids/Lashes Dermatochalasis - upper lid, Meibomian gland dysfunction, inflammed Hordeolum - lower lid - improving Dermatochalasis - upper lid, Meibomian gland dysfunction   Conjunctiva/Sclera White and quiet White and quiet   Cornea trace Punctate epithelial erosions,trace  EBMD Trace Punctate epithelial erosions   Anterior Chamber Deep and quiet Deep and quiet   Iris Round and dilated Round and dilated   Lens 2-3+ Nuclear sclerosis, 2-3+ Cortical cataract 2-3+ Nuclear sclerosis, 2-3+ Cortical cataract   Vitreous Vitreous syneresis, Posterior vitreous detachment, vitreous condensations Vitreous syneresis, Posterior vitreous detachment         Fundus Exam       Right Left   Disc Pink and Sharp Pink and Sharp, mild PPA, mild, focal PPP temporal   C/D Ratio 0.5 0.6   Macula Blunted foveal reflex, focal IRH (improved), +edema IT to fovea -- improved, prominent MA's temporal and inferior to fovea -- improved, +focal laser changes Flat, Good foveal reflex, Retinal pigment epithelial mottling, No heme or edema   Vessels attenuated, Tortuous attenuated, Tortuous   Periphery Attached, no heme, RT/RD Attached               IMAGING AND PROCEDURES  Imaging and Procedures for @TODAY @  Intravitreal Injection, Pharmacologic Agent - OD - Right Eye       Time  Out 04/27/2021. 9:08 AM. Confirmed correct patient, procedure, site, and patient consented.   Anesthesia Topical anesthesia was used. Anesthetic medications included Lidocaine 2%, Proparacaine 0.5%.   Procedure Preparation included 5% betadine to ocular surface, eyelid speculum.  A (32g) needle was used.   Injection: 2 mg aflibercept 2 MG/0.05ML   Route: Intravitreal, Site: Right Eye   NDC: [id number], Lot: [id number], Expiration date: 02/21/2022, Waste: 0.05 mL   Post-op Post injection exam found visual acuity of at least counting fingers. The patient tolerated the procedure well. There were no complications. The patient received written and verbal post procedure care education.             ASSESSMENT/PLAN:    ICD-10-CM   1. Branch retinal vein occlusion of right eye with macular edema  H34.8310 Intravitreal Injection, Pharmacologic Agent - OD - Right Eye    aflibercept (EYLEA) SOLN 2 mg    2. Retinal edema  H35.81 CANCELED: OCT, Retina - OU - Both Eyes    3. Essential hypertension  I10     4. Hypertensive retinopathy of both eyes  H35.033     5. Combined forms of age-related cataract of both eyes  H25.813     6. Posterior vitreous detachment of right eye  H43.811      1,2. BRVO with CME OD  - FA 5.14.21 shows +focal telangectasias with late leakage inferior macula consistent with remote inf BRVO  - S/P IVA OD #1 (05.14.21), #2 (06.14.21), #3 (07.16.21) -- IVA resistance  - s/p IVE OD #1 (08.18.21) -- sample, #2 (09.15.21), #3 (10.18.21), #4 (11.17.21), #5 (12.15.21), #6 (01.14.22), #7 (02.11.22), #8 (03.18.22), #9 (04.15.22), #10 (06.10.22), #11 (07.22.22), #12 (09.09.22)  - s/p focal laser OD (05.13.22)  - BCVA 20/20 OD (stable)  - OCT not taken today -- IT issues  - recommend IVE OD #13 today, 11.04.22 w/ return in 8 wks  - pt wishes to proceed with injection  - RBA of procedure discussed, questions answered  - see procedure note  - Eylea informed consent form  signed and scanned on 08.18.2021  - [employer] benefits investigation started, 08.18.21 -- approved as of 09.15.21 -- secondary ins pays -- verified for 2022  - F/U 8 weeks -- DFE/OCT/possible injection  3,4. Hypertensive retinopathy OU  - discussed importance of tight BP control  - monitor    5. Mixed form cataracts OU  - The symptoms of cataract, surgical options, and treatments and risks were discussed with patient.  - discussed diagnosis and progression  - not yet visually significant  - monitor for now  6. PVD / vitreous syneresis  - Discussed findings and prognosis  - No RT or RD on 360 scleral depressed exam  - Reviewed s/s of RT/RD  - Strict return precautions for any such RT/RD signs/symptoms  - monitor   Ophthalmic Meds Ordered this visit:  Meds ordered this encounter  Medications   aflibercept (EYLEA) SOLN 2 mg     No follow-ups on file.  There are no Patient Instructions on file for this visit.  This document serves as a record of services personally performed by Karie Chimera, MD, PhD. It was created on their behalf by Joni Reining, an ophthalmic technician. The creation of this record is the provider's dictation and/or activities during the visit.    Electronically signed by: Joni Reining COA, 04/27/21  9:59 AM  This document serves as a record of services personally performed by Karie Chimera, MD, PhD. It was created on their behalf by Glee Arvin. Manson Passey, OA an ophthalmic technician. The creation of this record is the provider's dictation and/or activities during the visit.    Electronically signed by: Glee Arvin. Manson Passey, OA 11.04.2022 9:59 AM  Karie Chimera, M.D., Ph.D. Diseases & Surgery of the Retina and Vitreous [facility] 04/27/2021  I have reviewed the above documentation for accuracy and completeness, and I agree with the above. Karie Chimera, M.D., Ph.D. 04/27/21 9:59 AM   Abbreviations: M myopia (nearsighted); A  astigmatism; H hyperopia (farsighted); P presbyopia; Mrx spectacle prescription;  CTL contact lenses; OD right eye; OS left eye; OU both eyes  XT exotropia; ET esotropia; PEK punctate epithelial keratitis; PEE punctate epithelial erosions; DES dry eye syndrome; MGD meibomian gland dysfunction; ATs artificial tears; PFAT's preservative free artificial tears; NSC nuclear sclerotic cataract; PSC posterior subcapsular cataract; ERM epi-retinal membrane; PVD posterior vitreous detachment; RD retinal detachment; DM diabetes mellitus; DR diabetic retinopathy; NPDR non-proliferative diabetic retinopathy; PDR proliferative diabetic retinopathy; CSME clinically significant macular edema; DME diabetic macular edema; dbh dot blot hemorrhages; CWS cotton wool spot; POAG primary open angle glaucoma; C/D cup-to-disc ratio; HVF humphrey visual field; GVF goldmann visual field; OCT optical coherence tomography; IOP intraocular pressure; BRVO Branch retinal vein occlusion; CRVO central retinal vein occlusion; CRAO central retinal artery occlusion; BRAO branch retinal artery occlusion; RT retinal tear; SB scleral buckle; PPV pars plana vitrectomy; VH Vitreous hemorrhage; PRP panretinal laser photocoagulation; IVK intravitreal kenalog; VMT vitreomacular traction; MH Macular hole;  NVD neovascularization of the disc; NVE neovascularization elsewhere; AREDS age related eye disease study; ARMD age related macular degeneration; POAG primary open angle glaucoma; EBMD epithelial/anterior basement membrane dystrophy; ACIOL anterior chamber intraocular lens; IOL intraocular lens; PCIOL posterior chamber intraocular lens; Phaco/IOL phacoemulsification with intraocular lens placement; PRK photorefractive keratectomy; LASIK laser assisted in situ keratomileusis; HTN hypertension; DM diabetes mellitus; COPD chronic obstructive pulmonary disease

## 2021-04-20 NOTE — Progress Notes (Signed)
Remote pacemaker transmission.   

## 2021-04-22 NOTE — Telephone Encounter (Signed)
May proceed with biopsy. Low risk.

## 2021-04-23 NOTE — Telephone Encounter (Signed)
I will forward to the pre op pool for final notes.

## 2021-04-23 NOTE — Telephone Encounter (Signed)
    Patient Name: [name] [name]  DOB: December 04, 1945 MRN: [id number]  Primary Cardiologist: Lewayne Bunting, MD  Chart reviewed as part of pre-operative protocol coverage.   Per Dr. Ladona Ridgel, patient is low risk for procedure.  Okay to hold aspirin 7 days if needed with plans to restart as soon as he is cleared to do so.  I will route this recommendation to the requesting party via Epic fax function and remove from pre-op pool.  Please call with questions.  Beatriz Stallion, PA-C 04/23/2021, 12:35 PM

## 2021-04-27 ENCOUNTER — Ambulatory Visit (INDEPENDENT_AMBULATORY_CARE_PROVIDER_SITE_OTHER): Payer: Medicare Other | Admitting: Ophthalmology

## 2021-04-27 ENCOUNTER — Encounter (INDEPENDENT_AMBULATORY_CARE_PROVIDER_SITE_OTHER): Payer: Self-pay | Admitting: Ophthalmology

## 2021-04-27 ENCOUNTER — Other Ambulatory Visit: Payer: Self-pay

## 2021-04-27 DIAGNOSIS — I10 Essential (primary) hypertension: Secondary | ICD-10-CM | POA: Diagnosis not present

## 2021-04-27 DIAGNOSIS — H35033 Hypertensive retinopathy, bilateral: Secondary | ICD-10-CM | POA: Diagnosis not present

## 2021-04-27 DIAGNOSIS — H3581 Retinal edema: Secondary | ICD-10-CM

## 2021-04-27 DIAGNOSIS — H34831 Tributary (branch) retinal vein occlusion, right eye, with macular edema: Secondary | ICD-10-CM

## 2021-04-27 DIAGNOSIS — H25813 Combined forms of age-related cataract, bilateral: Secondary | ICD-10-CM

## 2021-04-27 DIAGNOSIS — H43811 Vitreous degeneration, right eye: Secondary | ICD-10-CM

## 2021-04-27 MED ORDER — AFLIBERCEPT 2MG/0.05ML IZ SOLN FOR KALEIDOSCOPE
2.0000 mg | INTRAVITREAL | Status: AC | PRN
  Administered 2021-04-27: 2 mg via INTRAVITREAL

## 2021-06-13 NOTE — Progress Notes (Signed)
[facility] - Clinic Note  06/22/2021     CHIEF COMPLAINT Patient presents for Retina Follow Up   HISTORY OF PRESENT ILLNESS: [name] [name] is a 75 y.o. male who presents to the clinic today for:   HPI     Retina Follow Up   Patient presents with  CRVO/BRVO.  In right eye.  Duration of 8 weeks.  Since onset it is stable.  I, the attending physician,  performed the HPI with the patient and updated documentation appropriately.        Comments   8 week follow up BRVO OD- Doing well, vision appears stable OU.       Last edited by [name], MD on 06/22/2021  1:15 PM.     Referring physician: Margot Ables, MD [address] [place],  [place] [address]  HISTORICAL INFORMATION:   Selected notes from the MEDICAL RECORD NUMBER Referred by [name] for concern of vein occlusion OD LEE:  Ocular Hx- PMH-   CURRENT MEDICATIONS: No current outpatient medications on file. (Ophthalmic Drugs)   No current facility-administered medications for this visit. (Ophthalmic Drugs)   Current Outpatient Medications (Other)  Medication Sig   acetaminophen (TYLENOL) 500 MG tablet Take 500-1,000 mg by mouth every 6 (six) hours as needed for moderate pain or headache.   Cyanocobalamin (B-12 PO) Take 1 capsule by mouth daily.   finasteride (PROSCAR) 5 MG tablet Take 5 mg by mouth daily.   levothyroxine (SYNTHROID, LEVOTHROID) 100 MCG tablet TAKE 1 TABLET BY MOUTH ONCE DAILY   MELATONIN PO Take 1 tablet by mouth at bedtime.   Multiple Vitamin (MULTIVITAMIN WITH MINERALS) TABS tablet Take 1 tablet by mouth daily.   pravastatin (PRAVACHOL) 40 MG tablet TAKE 1 TABLET BY MOUTH EVERY DAY   sertraline (ZOLOFT) 100 MG tablet Take 1.5 tablets (150 mg total) by mouth daily.   sildenafil (VIAGRA) 100 MG tablet Take 50-100 mg by mouth daily as needed.   tamsulosin (FLOMAX) 0.4 MG CAPS capsule Take by mouth.   VITAMIN D PO Take 1 capsule by mouth daily.    No current facility-administered medications for this visit. (Other)   REVIEW OF SYSTEMS: ROS   Positive for: Genitourinary, Cardiovascular, Eyes Negative for: Constitutional, Gastrointestinal, Neurological, Skin, Musculoskeletal, HENT, Endocrine, Respiratory, Psychiatric, Allergic/Imm, Heme/Lymph Last edited by Joni Reining, COA on 06/22/2021  8:36 AM.     ALLERGIES Allergies  Allergen Reactions   Egg White (Diagnostic)     Showed up on allergy test   Penicillins     Did it involve swelling of the face/tongue/throat, SOB, or low BP? No Did it involve sudden or severe rash/hives, skin peeling, or any reaction on the inside of your mouth or nose? No Did you need to seek medical attention at a hospital or doctor's office? Unknown When did it last happen?      Childhood allergy If all above answers are NO, may proceed with cephalosporin use.    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   Cataract    Mixed form OU   History of skin cancer 04/29/2017   History of skin cancer 04/29/2017   Hypertensive retinopathy    OU   Hypothyroidism 04/29/2017   Itching  04/29/2017   Scabies 05/28/2017   Past Surgical History:  Procedure Laterality Date   PPM GENERATOR CHANGEOUT N/A 06/14/2019   Procedure: PPM GENERATOR CHANGEOUT;  Surgeon: [name], MD;  Location: [facility];  Service: Cardiovascular;  Laterality: N/A;   FAMILY HISTORY Family History  Problem Relation Age of Onset   Hypertension Brother    SOCIAL HISTORY Social History   Tobacco Use   Smoking status: Never   Smokeless tobacco: Never  Substance Use Topics   Alcohol use: Yes   Drug use: No       OPHTHALMIC EXAM: Base Eye Exam     Visual Acuity (Snellen - Linear)       Right Left   Dist [place] 20/20 -2 20/40    Dist ph   20/20         Tonometry (Tonopen, 8:42 AM)       Right Left   Pressure 15 14         Pupils       Dark Light Shape React APD   Right 3 2 Round Brisk None   Left 3 2 Round Brisk None         Visual Fields (Counting fingers)       Left Right    Full Full         Extraocular Movement       Right Left    Full Full         Neuro/Psych     Oriented x3: Yes   Mood/Affect: Normal         Dilation     Both eyes: 1.0% Mydriacyl, 2.5% Phenylephrine @ 8:42 AM           Slit Lamp and Fundus Exam     Slit Lamp Exam       Right Left   Lids/Lashes Dermatochalasis - upper lid, Meibomian gland dysfunction, inflammed Hordeolum - lower lid - improving Dermatochalasis - upper lid, Meibomian gland dysfunction   Conjunctiva/Sclera White and quiet White and quiet   Cornea trace Punctate epithelial erosions,trace  EBMD Trace Punctate epithelial erosions   Anterior Chamber Deep and quiet Deep and quiet   Iris Round and dilated Round and dilated   Lens 2-3+ Nuclear sclerosis, 2-3+ Cortical cataract 2-3+ Nuclear sclerosis, 2-3+ Cortical cataract   Anterior Vitreous Vitreous syneresis, Posterior vitreous detachment, vitreous condensations Vitreous syneresis, Posterior vitreous detachment         Fundus Exam       Right Left   Disc Pink and Sharp Pink and sharp, mild PPA, mild, focal PPP temporal   C/D Ratio 0.5 0.6   Macula Blunted foveal reflex, focal IRH (improved), + cystic changes/edema IT to fovea -- improved, prominent MA's temporal and inferior to fovea -- improved, +focal laser changes Flat, good foveal reflex, retinal pigment epithelial mottling, No heme or edema   Vessels attenuated, tortuous attenuated, tortuous   Periphery Attached, no heme, RT/RD Attached               IMAGING AND PROCEDURES  Imaging and Procedures for @TODAY @  OCT, Retina - OU - Both Eyes       Right Eye Quality was good. Central Foveal Thickness: 355.  Progression has improved. Findings include abnormal foveal contour, intraretinal fluid, no SRF, intraretinal hyper-reflective material (Persistent cystic changes - slightly decreased).   Left Eye Quality was good. Central Foveal Thickness: 330. Progression has been stable. Findings include normal foveal contour, no  IRF, no SRF (Partial PVD).   Notes *Images captured and stored on drive  Diagnosis / Impression:  OD: BRVO w/ Persistent cystic changes - slightly decreased OS: NFP, no IRF/SRF  Clinical management:  See below  Abbreviations: NFP - Normal foveal profile. CME - cystoid macular edema. PED - pigment epithelial detachment. IRF - intraretinal fluid. SRF - subretinal fluid. EZ - ellipsoid zone. ERM - epiretinal membrane. ORA - outer retinal atrophy. ORT - outer retinal tubulation. SRHM - subretinal hyper-reflective material      Intravitreal Injection, Pharmacologic Agent - OD - Right Eye       Time Out 06/22/2021. 9:23 AM. Confirmed correct patient, procedure, site, and patient consented.   Anesthesia Topical anesthesia was used. Anesthetic medications included Lidocaine 2%, Proparacaine 0.5%.   Procedure Preparation included 5% betadine to ocular surface, eyelid speculum. A (32g) needle was used.   Injection: 2 mg aflibercept 2 MG/0.05ML   Route: Intravitreal, Site: Right Eye   NDC: L6038910, Lot: [id number], Expiration date: 04/24/2022, Waste: 0.05 mL   Post-op Post injection exam found visual acuity of at least counting fingers. The patient tolerated the procedure well. There were no complications. The patient received written and verbal post procedure care education. Post injection medications were not given.            ASSESSMENT/PLAN:    ICD-10-CM   1. Branch retinal vein occlusion of right eye with macular edema  H34.8310 OCT, Retina - OU - Both Eyes    Intravitreal Injection, Pharmacologic Agent - OD - Right Eye    aflibercept (EYLEA) SOLN 2 mg     2. Retinal edema  H35.81     3. Essential hypertension  I10     4. Hypertensive retinopathy of both eyes  H35.033 OCT, Retina - OU - Both Eyes    5. Combined forms of age-related cataract of both eyes  H25.813     6. Posterior vitreous detachment of right eye  H43.811      1,2. BRVO with CME OD  - FA 5.14.21 shows +focal telangectasias with late leakage inferior macula consistent with remote inf BRVO  - S/P IVA OD #1 (05.14.21), #2 (06.14.21), #3 (07.16.21) -- IVA resistance  - s/p IVE OD #1 (08.18.21) -- sample, #2 (09.15.21), #3 (10.18.21), #4 (11.17.21), #5 (12.15.21), #6 (01.14.22), #7 (02.11.22), #8 (03.18.22), #9 (04.15.22), #10 (06.10.22), #11 (07.22.22), #12 (09.09.22), #13 (11.04.22)  - s/p focal laser OD (05.13.22)  - BCVA 20/20 OD (stable)  - OCT shows BRVO w/ Persistent cystic changes - slightly decreased at 8 wks  - recommend IVE OD #14 today, 12.30.22 w/ return in 8 wks  - pt wishes to proceed with injection  - RBA of procedure discussed, questions answered  - see procedure note  - Eylea informed consent form signed and scanned on 08.18.2021  - [email or web address] benefits investigation started, 08.18.21 -- approved as of 09.15.21 -- secondary ins pays -- verified for 2022  - F/U 8 weeks -- DFE/OCT/possible injection  3,4. Hypertensive retinopathy OU  - discussed importance of tight BP control  -  monitor     5. Mixed form cataracts OU  - The symptoms of cataract, surgical options, and treatments and risks were discussed with patient.  - discussed diagnosis and progression  - not yet visually significant  - monitor for now  6. PVD / vitreous syneresis  - Discussed findings and prognosis  - No RT or RD on 360 scleral depressed exam  - Reviewed s/s  of RT/RD  - Strict return precautions for any such RT/RD signs/symptoms  - monitor   Ophthalmic Meds Ordered this visit:  Meds ordered this encounter  Medications   aflibercept (EYLEA) SOLN 2 mg     Return in about 8  weeks (around 08/17/2021) for BRVO OD w/DFE/OCT/likely IVE OD.  There are no Patient Instructions on file for this visit.  This document serves as a record of services personally performed by Karie Chimera, MD, PhD. It was created on their behalf by Joni Reining, an ophthalmic technician. The creation of this record is the provider's dictation and/or activities during the visit.    Electronically signed by: Joni Reining COA, 06/22/21  1:16 PM   Karie Chimera, M.D., Ph.D. Diseases & Surgery of the Retina and Vitreous [facility] 06/22/2021  I have reviewed the above documentation for accuracy and completeness, and I agree with the above. Karie Chimera, M.D., Ph.D. 06/22/21 1:17 PM   Abbreviations: M myopia (nearsighted); A astigmatism; H hyperopia (farsighted); P presbyopia; Mrx spectacle prescription;  CTL contact lenses; OD right eye; OS left eye; OU both eyes  XT exotropia; ET esotropia; PEK punctate epithelial keratitis; PEE punctate epithelial erosions; DES dry eye syndrome; MGD meibomian gland dysfunction; ATs artificial tears; PFAT's preservative free artificial tears; NSC nuclear sclerotic cataract; PSC posterior subcapsular cataract; ERM epi-retinal membrane; PVD posterior vitreous detachment; RD retinal detachment; DM diabetes mellitus; DR diabetic retinopathy; NPDR non-proliferative diabetic retinopathy; PDR proliferative diabetic retinopathy; CSME clinically significant macular edema; DME diabetic macular edema; dbh dot blot hemorrhages; CWS cotton wool spot; POAG primary open angle glaucoma; C/D cup-to-disc ratio; HVF humphrey visual field; GVF goldmann visual field; OCT optical coherence tomography; IOP intraocular pressure; BRVO Branch retinal vein occlusion; CRVO central retinal vein occlusion; CRAO central retinal artery occlusion; BRAO branch retinal artery occlusion; RT retinal tear; SB scleral buckle; PPV pars plana vitrectomy; VH Vitreous hemorrhage;  PRP panretinal laser photocoagulation; IVK intravitreal kenalog; VMT vitreomacular traction; MH Macular hole;  NVD neovascularization of the disc; NVE neovascularization elsewhere; AREDS age related eye disease study; ARMD age related macular degeneration; POAG primary open angle glaucoma; EBMD epithelial/anterior basement membrane dystrophy; ACIOL anterior chamber intraocular lens; IOL intraocular lens; PCIOL posterior chamber intraocular lens; Phaco/IOL phacoemulsification with intraocular lens placement; PRK photorefractive keratectomy; LASIK laser assisted in situ keratomileusis; HTN hypertension; DM diabetes mellitus; COPD chronic obstructive pulmonary disease

## 2021-06-22 ENCOUNTER — Encounter (INDEPENDENT_AMBULATORY_CARE_PROVIDER_SITE_OTHER): Payer: Self-pay | Admitting: Ophthalmology

## 2021-06-22 ENCOUNTER — Other Ambulatory Visit: Payer: Self-pay

## 2021-06-22 ENCOUNTER — Ambulatory Visit (INDEPENDENT_AMBULATORY_CARE_PROVIDER_SITE_OTHER): Payer: Medicare Other | Admitting: Ophthalmology

## 2021-06-22 DIAGNOSIS — H3581 Retinal edema: Secondary | ICD-10-CM

## 2021-06-22 DIAGNOSIS — H43811 Vitreous degeneration, right eye: Secondary | ICD-10-CM

## 2021-06-22 DIAGNOSIS — H35033 Hypertensive retinopathy, bilateral: Secondary | ICD-10-CM

## 2021-06-22 DIAGNOSIS — I10 Essential (primary) hypertension: Secondary | ICD-10-CM

## 2021-06-22 DIAGNOSIS — H34831 Tributary (branch) retinal vein occlusion, right eye, with macular edema: Secondary | ICD-10-CM

## 2021-06-22 DIAGNOSIS — H25813 Combined forms of age-related cataract, bilateral: Secondary | ICD-10-CM

## 2021-06-22 MED ORDER — AFLIBERCEPT 2MG/0.05ML IZ SOLN FOR KALEIDOSCOPE
2.0000 mg | INTRAVITREAL | Status: AC | PRN
  Administered 2021-06-22: 13:00:00 2 mg via INTRAVITREAL

## 2021-07-13 ENCOUNTER — Ambulatory Visit (INDEPENDENT_AMBULATORY_CARE_PROVIDER_SITE_OTHER): Payer: Medicare Other

## 2021-07-13 DIAGNOSIS — I442 Atrioventricular block, complete: Secondary | ICD-10-CM | POA: Diagnosis not present

## 2021-07-13 LAB — CUP PACEART REMOTE DEVICE CHECK
Battery Remaining Longevity: 99 mo
Battery Remaining Percentage: 82 %
Battery Voltage: 3.01 V
Brady Statistic AP VP Percent: 47 %
Brady Statistic AP VS Percent: 1 %
Brady Statistic AS VP Percent: 53 %
Brady Statistic AS VS Percent: 1 %
Brady Statistic RA Percent Paced: 46 %
Brady Statistic RV Percent Paced: 99 %
Date Time Interrogation Session: 20230120020013
Implantable Lead Implant Date: 20101229
Implantable Lead Implant Date: 20101229
Implantable Lead Location: 753859
Implantable Lead Location: 753860
Implantable Pulse Generator Implant Date: 20201221
Lead Channel Impedance Value: 450 Ohm
Lead Channel Impedance Value: 530 Ohm
Lead Channel Pacing Threshold Amplitude: 0.75 V
Lead Channel Pacing Threshold Amplitude: 0.75 V
Lead Channel Pacing Threshold Pulse Width: 0.5 ms
Lead Channel Pacing Threshold Pulse Width: 0.5 ms
Lead Channel Sensing Intrinsic Amplitude: 11.4 mV
Lead Channel Sensing Intrinsic Amplitude: 2.7 mV
Lead Channel Setting Pacing Amplitude: 1 V
Lead Channel Setting Pacing Amplitude: 1.75 V
Lead Channel Setting Pacing Pulse Width: 0.5 ms
Lead Channel Setting Sensing Sensitivity: 4 mV
Pulse Gen Model: 2272
Pulse Gen Serial Number: 9190527

## 2021-07-26 NOTE — Progress Notes (Signed)
Remote pacemaker transmission.   

## 2021-08-10 NOTE — Progress Notes (Signed)
[facility] - Clinic Note  08/17/2021     CHIEF COMPLAINT Patient presents for Retina Follow Up   HISTORY OF PRESENT ILLNESS: [name] [name] is a 76 y.o. male who presents to the clinic today for:   HPI     Retina Follow Up   Patient presents with  CRVO/BRVO.  In right eye.  Duration of 8 weeks.  Since onset it is stable.  I, the attending physician,  performed the HPI with the patient and updated documentation appropriately.        Comments   8 week follow up BRVO OD-  No change in vision since last visit OU.        Last edited by [name], MD on 08/17/2021  9:59 AM.      Referring physician: Margot Ables, MD [address] [place],  [place] [address]  HISTORICAL INFORMATION:   Selected notes from the MEDICAL RECORD NUMBER Referred by [name] for concern of vein occlusion OD LEE:  Ocular Hx- PMH-   CURRENT MEDICATIONS: No current outpatient medications on file. (Ophthalmic Drugs)   No current facility-administered medications for this visit. (Ophthalmic Drugs)   Current Outpatient Medications (Other)  Medication Sig   acetaminophen (TYLENOL) 500 MG tablet Take 500-1,000 mg by mouth every 6 (six) hours as needed for moderate pain or headache.   Cyanocobalamin (B-12 PO) Take 1 capsule by mouth daily.   finasteride (PROSCAR) 5 MG tablet Take 5 mg by mouth daily.   levothyroxine (SYNTHROID, LEVOTHROID) 100 MCG tablet TAKE 1 TABLET BY MOUTH ONCE DAILY   MELATONIN PO Take 1 tablet by mouth at bedtime.   Multiple Vitamin (MULTIVITAMIN WITH MINERALS) TABS tablet Take 1 tablet by mouth daily.   pravastatin (PRAVACHOL) 40 MG tablet TAKE 1 TABLET BY MOUTH EVERY DAY   sertraline (ZOLOFT) 100 MG tablet Take 1.5 tablets (150 mg total) by mouth daily.   sildenafil (VIAGRA) 100 MG tablet Take 50-100 mg by mouth daily as needed.   tamsulosin (FLOMAX) 0.4 MG CAPS capsule Take by mouth.   VITAMIN D PO Take 1 capsule by mouth  daily.   No current facility-administered medications for this visit. (Other)   REVIEW OF SYSTEMS: ROS   Positive for: Genitourinary, Cardiovascular, Eyes Negative for: Constitutional, Gastrointestinal, Neurological, Skin, Musculoskeletal, HENT, Endocrine, Respiratory, Psychiatric, Allergic/Imm, Heme/Lymph Last edited by Joni Reining, COA on 08/17/2021  9:05 AM.     ALLERGIES Allergies  Allergen Reactions   Egg White (Diagnostic)     Showed up on allergy test   Penicillins     Did it involve swelling of the face/tongue/throat, SOB, or low BP? No Did it involve sudden or severe rash/hives, skin peeling, or any reaction on the inside of your mouth or nose? No Did you need to seek medical attention at a hospital or doctor's office? Unknown When did it last happen?      Childhood allergy If all above answers are NO, may proceed with cephalosporin use.    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   Cataract    Mixed form OU   History of skin cancer 04/29/2017   History of skin cancer 04/29/2017   Hypertensive retinopathy    OU   Hypothyroidism  04/29/2017   Itching 04/29/2017   Scabies 05/28/2017   Past Surgical History:  Procedure Laterality Date   PPM GENERATOR CHANGEOUT N/A 06/14/2019   Procedure: PPM GENERATOR CHANGEOUT;  Surgeon: [name], MD;  Location: [facility] INVASIVE CV LAB;  Service: Cardiovascular;  Laterality: N/A;   FAMILY HISTORY Family History  Problem Relation Age of Onset   Hypertension Brother    SOCIAL HISTORY Social History   Tobacco Use   Smoking status: Never   Smokeless tobacco: Never  Substance Use Topics   Alcohol use: Yes   Drug use: No       OPHTHALMIC EXAM: Base Eye Exam     Visual Acuity (Snellen - Linear)       Right Left   Dist [place] 20/25 +2  20/40 -2   Dist ph  20/20 20/20         Tonometry (Tonopen, 9:13 AM)       Right Left   Pressure 16 17         Pupils       Dark Light Shape React APD   Right 3 2 Round Brisk None   Left 3 2 Round Brisk None         Visual Fields (Counting fingers)       Left Right    Full Full         Extraocular Movement       Right Left    Full Full         Neuro/Psych     Oriented x3: Yes   Mood/Affect: Normal         Dilation     Both eyes: 1.0% Mydriacyl, 2.5% Phenylephrine @ 9:13 AM           Slit Lamp and Fundus Exam     Slit Lamp Exam       Right Left   Lids/Lashes Dermatochalasis - upper lid, Meibomian gland dysfunction, inflammed Hordeolum - lower lid - improving Dermatochalasis - upper lid, Meibomian gland dysfunction   Conjunctiva/Sclera White and quiet White and quiet   Cornea trace Punctate epithelial erosions,trace  EBMD Trace Punctate epithelial erosions   Anterior Chamber Deep and quiet Deep and quiet   Iris Round and dilated Round and dilated   Lens 2-3+ Nuclear sclerosis, 2-3+ Cortical cataract 2-3+ Nuclear sclerosis, 2-3+ Cortical cataract   Anterior Vitreous Vitreous syneresis, Posterior vitreous detachment, vitreous condensations Vitreous syneresis, Posterior vitreous detachment         Fundus Exam       Right Left   Disc Pink and Sharp Pink and sharp, mild PPA, mild, focal PPP temporal   C/D Ratio 0.5 0.5   Macula Good foveal reflex, focal IRH (improved), +cystic changes/edema IT to fovea -- improved, prominent MA's temporal and inferior to fovea -- improved, mild focal laser changes Flat, good foveal reflex, retinal pigment epithelial mottling, No heme or edema   Vessels attenuated, tortuous attenuated, tortuous   Periphery Attached, no heme, RT/RD Attached              IMAGING AND PROCEDURES  Imaging and Procedures for @TODAY @  OCT, Retina - OU - Both Eyes       Right Eye Quality was good. Central Foveal  Thickness: 331. Progression has improved. Findings include abnormal foveal contour, intraretinal fluid, no SRF, intraretinal hyper-reflective material (Interval improvement in IRF/cystic changes inferior fovea and macula).   Left Eye Quality was good. Central Foveal Thickness: 326. Progression has been  stable. Findings include normal foveal contour, no IRF, no SRF (Partial PVD).   Notes *Images captured and stored on drive  Diagnosis / Impression:  OD: BRVO w/ Interval improvement in IRF/cystic changes inferior fovea and macula OS: NFP, no IRF/SRF  Clinical management:  See below  Abbreviations: NFP - Normal foveal profile. CME - cystoid macular edema. PED - pigment epithelial detachment. IRF - intraretinal fluid. SRF - subretinal fluid. EZ - ellipsoid zone. ERM - epiretinal membrane. ORA - outer retinal atrophy. ORT - outer retinal tubulation. SRHM - subretinal hyper-reflective material      Intravitreal Injection, Pharmacologic Agent - OD - Right Eye       Time Out 08/17/2021. 9:57 AM. Confirmed correct patient, procedure, site, and patient consented.   Anesthesia Topical anesthesia was used. Anesthetic medications included Lidocaine 2%, Proparacaine 0.5%.   Procedure Preparation included 5% betadine to ocular surface, eyelid speculum. A (32g) needle was used.   Injection: 2 mg aflibercept 2 MG/0.05ML   Route: Intravitreal, Site: Right Eye   NDC: [id number], Lot: [id number], Expiration date: 06/23/2022, Waste: 0.05 mL   Post-op Post injection exam found visual acuity of at least counting fingers. The patient tolerated the procedure well. There were no complications. The patient received written and verbal post procedure care education. Post injection medications were not given.            ASSESSMENT/PLAN:    ICD-10-CM   1. Branch retinal vein occlusion of right eye with macular edema  H34.8310 OCT, Retina - OU - Both Eyes    Intravitreal Injection, Pharmacologic  Agent - OD - Right Eye    aflibercept (EYLEA) SOLN 2 mg    2. Essential hypertension  I10     3. Hypertensive retinopathy of both eyes  H35.033     4. Combined forms of age-related cataract of both eyes  H25.813     5. Posterior vitreous detachment of right eye  H43.811      1. BRVO with CME OD  - FA 5.14.21 shows +focal telangectasias with late leakage inferior macula consistent with remote inf BRVO  - S/P IVA OD #1 (05.14.21), #2 (06.14.21), #3 (07.16.21) -- IVA resistance  - s/p IVE OD #1 (08.18.21) -- sample, #2 (09.15.21), #3 (10.18.21), #4 (11.17.21), #5 (12.15.21), #6 (01.14.22), #7 (02.11.22), #8 (03.18.22), #9 (04.15.22), #10 (06.10.22), #11 (07.22.22), #12 (09.09.22), #13 (11.04.22), #14 (12.30.22)  - s/p focal laser OD (05.13.22)  - BCVA 20/20 OD (stable)  - OCT shows BRVO w/ Persistent cystic changes - improved at 8 wks  - recommend IVE OD #15 today, 02.24.23 w/ ext f/u to 10 wks  - pt wishes to proceed with injection  - RBA of procedure discussed, questions answered  - see procedure note  - Eylea informed consent form signed and scanned on 08.18.2021  - [employer] benefits investigation started, 08.18.21 -- approved as of 09.15.21 -- secondary ins pays -- verified for 2022  - F/U 10 weeks -- DFE/OCT/possible injection  2,3. Hypertensive retinopathy OU  - discussed importance of tight BP control  -  monitor    4. Mixed form cataracts OU  - The symptoms of cataract, surgical options, and treatments and risks were discussed with patient.  - discussed diagnosis and progression  - not yet visually significant  - monitor for now  5. PVD / vitreous syneresis  - Discussed findings and prognosis  - No RT or RD on 360 scleral depressed exam  - Reviewed s/s of RT/RD  -  Strict return precautions for any such RT/RD signs/symptoms  - monitor   Ophthalmic Meds Ordered this visit:  Meds ordered this encounter  Medications   aflibercept (EYLEA) SOLN 2 mg     Return in  about 10 weeks (around 10/26/2021) for BRVO OD, Dilated Exam, OCT, Possible Injxn.  There are no Patient Instructions on file for this visit.  This document serves as a record of services personally performed by Karie ChimeraBrian G. Rober Skeels, MD, PhD. It was created on their behalf by Joni Reiningobin Hodges, an ophthalmic technician. The creation of this record is the provider's dictation and/or activities during the visit.    Electronically signed by: Joni Reiningobin Hodges COA, 08/17/21  11:55 AM  Karie ChimeraBrian G. Takira Sherrin, M.D., Ph.D. Diseases & Surgery of the Retina and Vitreous [facility]  I have reviewed the above documentation for accuracy and completeness, and I agree with the above. Karie ChimeraBrian G. Yehudit Fulginiti, M.D., Ph.D. 08/17/21 11:55 AM   Abbreviations: M myopia (nearsighted); A astigmatism; H hyperopia (farsighted); P presbyopia; Mrx spectacle prescription;  CTL contact lenses; OD right eye; OS left eye; OU both eyes  XT exotropia; ET esotropia; PEK punctate epithelial keratitis; PEE punctate epithelial erosions; DES dry eye syndrome; MGD meibomian gland dysfunction; ATs artificial tears; PFAT's preservative free artificial tears; NSC nuclear sclerotic cataract; PSC posterior subcapsular cataract; ERM epi-retinal membrane; PVD posterior vitreous detachment; RD retinal detachment; DM diabetes mellitus; DR diabetic retinopathy; NPDR non-proliferative diabetic retinopathy; PDR proliferative diabetic retinopathy; CSME clinically significant macular edema; DME diabetic macular edema; dbh dot blot hemorrhages; CWS cotton wool spot; POAG primary open angle glaucoma; C/D cup-to-disc ratio; HVF humphrey visual field; GVF goldmann visual field; OCT optical coherence tomography; IOP intraocular pressure; BRVO Branch retinal vein occlusion; CRVO central retinal vein occlusion; CRAO central retinal artery occlusion; BRAO branch retinal artery occlusion; RT retinal tear; SB scleral buckle; PPV pars plana vitrectomy; VH Vitreous  hemorrhage; PRP panretinal laser photocoagulation; IVK intravitreal kenalog; VMT vitreomacular traction; MH Macular hole;  NVD neovascularization of the disc; NVE neovascularization elsewhere; AREDS age related eye disease study; ARMD age related macular degeneration; POAG primary open angle glaucoma; EBMD epithelial/anterior basement membrane dystrophy; ACIOL anterior chamber intraocular lens; IOL intraocular lens; PCIOL posterior chamber intraocular lens; Phaco/IOL phacoemulsification with intraocular lens placement; PRK photorefractive keratectomy; LASIK laser assisted in situ keratomileusis; HTN hypertension; DM diabetes mellitus; COPD chronic obstructive pulmonary disease

## 2021-08-17 ENCOUNTER — Encounter (INDEPENDENT_AMBULATORY_CARE_PROVIDER_SITE_OTHER): Payer: Self-pay | Admitting: Ophthalmology

## 2021-08-17 ENCOUNTER — Ambulatory Visit (INDEPENDENT_AMBULATORY_CARE_PROVIDER_SITE_OTHER): Payer: Medicare Other | Admitting: Ophthalmology

## 2021-08-17 ENCOUNTER — Other Ambulatory Visit: Payer: Self-pay

## 2021-08-17 DIAGNOSIS — H25813 Combined forms of age-related cataract, bilateral: Secondary | ICD-10-CM

## 2021-08-17 DIAGNOSIS — H35033 Hypertensive retinopathy, bilateral: Secondary | ICD-10-CM | POA: Diagnosis not present

## 2021-08-17 DIAGNOSIS — H43811 Vitreous degeneration, right eye: Secondary | ICD-10-CM | POA: Diagnosis not present

## 2021-08-17 DIAGNOSIS — I10 Essential (primary) hypertension: Secondary | ICD-10-CM

## 2021-08-17 DIAGNOSIS — H34831 Tributary (branch) retinal vein occlusion, right eye, with macular edema: Secondary | ICD-10-CM

## 2021-08-17 MED ORDER — AFLIBERCEPT 2MG/0.05ML IZ SOLN FOR KALEIDOSCOPE
2.0000 mg | INTRAVITREAL | Status: AC | PRN
  Administered 2021-08-17: 2 mg via INTRAVITREAL

## 2021-10-12 ENCOUNTER — Ambulatory Visit (INDEPENDENT_AMBULATORY_CARE_PROVIDER_SITE_OTHER): Payer: Medicare Other

## 2021-10-12 DIAGNOSIS — I442 Atrioventricular block, complete: Secondary | ICD-10-CM | POA: Diagnosis not present

## 2021-10-12 LAB — CUP PACEART REMOTE DEVICE CHECK
Battery Remaining Longevity: 97 mo
Battery Remaining Percentage: 80 %
Battery Voltage: 3.01 V
Brady Statistic AP VP Percent: 46 %
Brady Statistic AP VS Percent: 1 %
Brady Statistic AS VP Percent: 54 %
Brady Statistic AS VS Percent: 1 %
Brady Statistic RA Percent Paced: 46 %
Brady Statistic RV Percent Paced: 99 %
Date Time Interrogation Session: 20230421020015
Implantable Lead Implant Date: 20101229
Implantable Lead Implant Date: 20101229
Implantable Lead Location: 753859
Implantable Lead Location: 753860
Implantable Pulse Generator Implant Date: 20201221
Lead Channel Impedance Value: 460 Ohm
Lead Channel Impedance Value: 550 Ohm
Lead Channel Pacing Threshold Amplitude: 0.625 V
Lead Channel Pacing Threshold Amplitude: 0.75 V
Lead Channel Pacing Threshold Pulse Width: 0.5 ms
Lead Channel Pacing Threshold Pulse Width: 0.5 ms
Lead Channel Sensing Intrinsic Amplitude: 11.4 mV
Lead Channel Sensing Intrinsic Amplitude: 2.8 mV
Lead Channel Setting Pacing Amplitude: 0.875
Lead Channel Setting Pacing Amplitude: 1.75 V
Lead Channel Setting Pacing Pulse Width: 0.5 ms
Lead Channel Setting Sensing Sensitivity: 4 mV
Pulse Gen Model: 2272
Pulse Gen Serial Number: 9190527

## 2021-10-30 ENCOUNTER — Encounter (INDEPENDENT_AMBULATORY_CARE_PROVIDER_SITE_OTHER): Payer: Medicare Other | Admitting: Ophthalmology

## 2021-10-30 NOTE — Progress Notes (Signed)
Remote pacemaker transmission.   

## 2021-10-31 ENCOUNTER — Encounter (INDEPENDENT_AMBULATORY_CARE_PROVIDER_SITE_OTHER): Payer: Medicare Other | Admitting: Ophthalmology

## 2021-10-31 ENCOUNTER — Ambulatory Visit (INDEPENDENT_AMBULATORY_CARE_PROVIDER_SITE_OTHER): Payer: Medicare Other | Admitting: Ophthalmology

## 2021-10-31 ENCOUNTER — Encounter (INDEPENDENT_AMBULATORY_CARE_PROVIDER_SITE_OTHER): Payer: Self-pay | Admitting: Ophthalmology

## 2021-10-31 DIAGNOSIS — H35033 Hypertensive retinopathy, bilateral: Secondary | ICD-10-CM | POA: Diagnosis not present

## 2021-10-31 DIAGNOSIS — I10 Essential (primary) hypertension: Secondary | ICD-10-CM | POA: Diagnosis not present

## 2021-10-31 DIAGNOSIS — H25813 Combined forms of age-related cataract, bilateral: Secondary | ICD-10-CM

## 2021-10-31 DIAGNOSIS — H43811 Vitreous degeneration, right eye: Secondary | ICD-10-CM

## 2021-10-31 DIAGNOSIS — H34831 Tributary (branch) retinal vein occlusion, right eye, with macular edema: Secondary | ICD-10-CM

## 2021-10-31 MED ORDER — AFLIBERCEPT 2MG/0.05ML IZ SOLN FOR KALEIDOSCOPE
2.0000 mg | INTRAVITREAL | Status: AC | PRN
  Administered 2021-10-31: 2 mg via INTRAVITREAL

## 2021-10-31 NOTE — Progress Notes (Signed)
?[facility] - Clinic Note ? ?10/31/2021 ? ?  ? ?CHIEF COMPLAINT ?Patient presents for Retina Follow Up ? ? ?HISTORY OF PRESENT ILLNESS: ?[name] [name] is a 76 y.o. male who presents to the clinic today for:  ? ?HPI   ? ? Retina Follow Up   ?Patient presents with  CRVO/BRVO (IVE OD #15 (02.24.23)).  In right eye.  This started years ago.  Duration of 10 weeks.  Since onset it is stable.  I, the attending physician,  performed the HPI with the patient and updated documentation appropriately. ? ?  ?  ? ? Comments   ?Patient denies any visual changes at this time. He states that his blood pressure is under control with medication.  ? ?  ?  ?Last edited by [name], MD on 10/31/2021 11:42 AM.  ?  ? ? ?Referring physician: ?No referring provider defined for this encounter. ? ?HISTORICAL INFORMATION:  ? ?Selected notes from the MEDICAL RECORD NUMBER ?Referred by [name] for concern of vein occlusion OD ?LEE:  ?Ocular Hx- ?PMH-  ? ?CURRENT MEDICATIONS: ?No current outpatient medications on file. (Ophthalmic Drugs)  ? ?No current facility-administered medications for this visit. (Ophthalmic Drugs)  ? ?Current Outpatient Medications (Other)  ?Medication Sig  ? acetaminophen (TYLENOL) 500 MG tablet Take 500-1,000 mg by mouth every 6 (six) hours as needed for moderate pain or headache.  ? Cyanocobalamin (B-12 PO) Take 1 capsule by mouth daily.  ? finasteride (PROSCAR) 5 MG tablet Take 5 mg by mouth daily.  ? levothyroxine (SYNTHROID, LEVOTHROID) 100 MCG tablet TAKE 1 TABLET BY MOUTH ONCE DAILY  ? MELATONIN PO Take 1 tablet by mouth at bedtime.  ? Multiple Vitamin (MULTIVITAMIN WITH MINERALS) TABS tablet Take 1 tablet by mouth daily.  ? pravastatin (PRAVACHOL) 40 MG tablet TAKE 1 TABLET BY MOUTH EVERY DAY  ? sertraline (ZOLOFT) 100 MG tablet Take 1.5 tablets (150 mg total) by mouth daily.  ? sildenafil (VIAGRA) 100 MG tablet Take 50-100 mg by mouth daily as needed.  ? tamsulosin (FLOMAX) 0.4  MG CAPS capsule Take by mouth.  ? VITAMIN D PO Take 1 capsule by mouth daily.  ? ?No current facility-administered medications for this visit. (Other)  ? ?REVIEW OF SYSTEMS: ?ROS   ?Positive for: Genitourinary, Cardiovascular, Eyes ?Negative for: Constitutional, Gastrointestinal, Neurological, Skin, Musculoskeletal, HENT, Endocrine, Respiratory, Psychiatric, Allergic/Imm, Heme/Lymph ?Last edited by Julieanne Cotton, [name] on 10/31/2021  8:48 AM.  ?  ? ?ALLERGIES ?Allergies  ?Allergen Reactions  ? Egg White (Diagnostic)   ?  Showed up on allergy test  ? Penicillins   ?  Did it involve swelling of the face/tongue/throat, SOB, or low BP? No ?Did it involve sudden or severe rash/hives, skin peeling, or any reaction on the inside of your mouth or nose? No ?Did you need to seek medical attention at a hospital or doctor's office? Unknown ?When did it last happen?      Childhood allergy ?If all above answers are ?NO?, may proceed with cephalosporin use. ?  ? Shrimp [Shellfish Allergy]   ?  Showed up on allergy test  ? Wheat Bran   ?  Showed up on allergy test  ? Sulfa Antibiotics Rash  ? ?PAST MEDICAL HISTORY ?Past Medical History:  ?Diagnosis Date  ? 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 Cataract   ? Mixed form OU  ? History of skin cancer 04/29/2017  ? History of skin cancer 04/29/2017  ?  Hypertensive retinopathy   ? OU  ? Hypothyroidism 04/29/2017  ? Itching 04/29/2017  ? Scabies 05/28/2017  ? ?Past Surgical History:  ?Procedure Laterality Date  ? PPM GENERATOR CHANGEOUT N/A 06/14/2019  ? Procedure: PPM GENERATOR CHANGEOUT;  Surgeon: Marinus Mawaylor, Gregg W, MD;  [facility];  Service: Cardiovascular;  Laterality: N/A;  ? ?FAMILY HISTORY ?Family History  ?Problem Relation Age of Onset  ? Hypertension Brother   ? ?SOCIAL HISTORY ?Social History  ? ?Tobacco Use  ? Smoking status: Never  ? Smokeless tobacco: Never  ?Substance Use Topics  ? Alcohol use: Yes  ? Drug use: No  ?  ? ?  ?OPHTHALMIC EXAM: ?Base Eye Exam    ? ? Visual Acuity (Snellen - Linear)   ? ?   Right Left  ? Dist [place] 20/20 -1 20/20 +2  ? ?  ?  ? ? Tonometry (Tonopen, 8:53 AM)   ? ?   Right Left  ? Pressure 12 15  ? ?  ?  ? ? Pupils   ? ?   Dark Light Shape React APD  ? Right 3 2 Round Brisk None  ? Left 3 2 Round Brisk None  ? ?  ?  ? ? Visual Fields   ? ?   Left Right  ?  Full Full  ? ?  ?  ? ? Extraocular Movement   ? ?   Right Left  ?  Full Full  ? ?  ?  ? ? Neuro/Psych   ? ? Oriented x3: Yes  ? Mood/Affect: Normal  ? ?  ?  ? ? Dilation   ? ? Both eyes: 2.5% Phenylephrine @ 8:49 AM  ? ?  ?  ? ?  ? ?Slit Lamp and Fundus Exam   ? ? Slit Lamp Exam   ? ?   Right Left  ? Lids/Lashes Dermatochalasis - upper lid, Meibomian gland dysfunction, inflammed Hordeolum - lower lid - improving Dermatochalasis - upper lid, Meibomian gland dysfunction  ? Conjunctiva/Sclera White and quiet White and quiet  ? Cornea trace Punctate epithelial erosions,trace  EBMD Trace Punctate epithelial erosions  ? Anterior Chamber Deep and quiet Deep and quiet  ? Iris Round and dilated Round and dilated  ? Lens 2-3+ Nuclear sclerosis, 2-3+ Cortical cataract 2-3+ Nuclear sclerosis, 2-3+ Cortical cataract  ? Anterior Vitreous Vitreous syneresis, Posterior vitreous detachment, vitreous condensations Vitreous syneresis, Posterior vitreous detachment  ? ?  ?  ? ? Fundus Exam   ? ?   Right Left  ? Disc Pink and Sharp Pink and sharp, mild PPA, mild, focal PPP temporal  ? C/D Ratio 0.5 0.5  ? Macula Good foveal reflex, +cystic changes/edema IT to fovea -- improved, prominent MA's temporal and inferior to fovea -- improved, mild focal laser changes, no heme Flat, good foveal reflex, retinal pigment epithelial mottling, No heme or edema  ? Vessels attenuated, tortuous attenuated, tortuous  ? Periphery Attached, no heme, RT/RD Attached     ? ?  ?  ? ?  ? ?IMAGING AND PROCEDURES  ?Imaging and Procedures for @TODAY @ ? ?OCT, Retina - OU - Both Eyes   ? ?   ?Right Eye ?Quality was good. Central Foveal  Thickness: 328. Progression has improved. Findings include abnormal foveal contour, intraretinal fluid, no SRF, intraretinal hyper-reflective material (Interval improvement in IRF/cystic changes inferior fovea and macula).  ? ?Left Eye ?Quality was good. Central Foveal Thickness: 330. Progression has been stable. Findings include  normal foveal contour, no IRF, no SRF (Partial PVD).  ? ?Notes ?*Images captured and stored on drive ? ?Diagnosis / Impression:  ?OD: BRVO w/ Interval improvement in IRF/cystic changes inferior fovea and macula ?OS: NFP, no IRF/SRF ? ?Clinical management:  ?See below ? ?Abbreviations: NFP - Normal foveal profile. CME - cystoid macular edema. PED - pigment epithelial detachment. IRF - intraretinal fluid. SRF - subretinal fluid. EZ - ellipsoid zone. ERM - epiretinal membrane. ORA - outer retinal atrophy. ORT - outer retinal tubulation. SRHM - subretinal hyper-reflective material ? ? ?  ? ?Intravitreal Injection, Pharmacologic Agent - OD - Right Eye   ? ?   ?Time Out ?10/31/2021. 9:17 AM. Confirmed correct patient, procedure, site, and patient consented.  ? ?Anesthesia ?Topical anesthesia was used. Anesthetic medications included Lidocaine 2%, Proparacaine 0.5%.  ? ?Procedure ?Preparation included 5% betadine to ocular surface, eyelid speculum. A (32g) needle was used.  ? ?Injection: ?2 mg aflibercept 2 MG/0.05ML ?  Route: Intravitreal, Site: Right Eye ?  NDC: [id number], Lot: [id number], Expiration date: 09/22/2022, Waste: 0 mL  ? ?Post-op ?Post injection exam found visual acuity of at least counting fingers. The patient tolerated the procedure well. There were no complications. The patient received written and verbal post procedure care education. Post injection medications were not given.  ? ?  ? ?  ?  ? ?  ?ASSESSMENT/PLAN: ? ?  ICD-10-CM   ?1. Branch retinal vein occlusion of right eye with macular edema  H34.8310 OCT, Retina - OU - Both Eyes  ?  Intravitreal Injection, Pharmacologic  Agent - OD - Right Eye  ?  aflibercept (EYLEA) SOLN 2 mg  ?  ?2. Essential hypertension  I10   ?  ?3. Hypertensive retinopathy of both eyes  H35.033 OCT, Retina - OU - Both Eyes  ?  ?4. Combined forms of age-relat

## 2021-11-02 ENCOUNTER — Encounter (INDEPENDENT_AMBULATORY_CARE_PROVIDER_SITE_OTHER): Payer: Medicare Other | Admitting: Ophthalmology

## 2022-01-11 ENCOUNTER — Ambulatory Visit (INDEPENDENT_AMBULATORY_CARE_PROVIDER_SITE_OTHER): Payer: Medicare Other

## 2022-01-11 DIAGNOSIS — I442 Atrioventricular block, complete: Secondary | ICD-10-CM

## 2022-01-11 LAB — CUP PACEART REMOTE DEVICE CHECK
Battery Remaining Longevity: 91 mo
Battery Remaining Percentage: 77 %
Battery Voltage: 3.01 V
Brady Statistic AP VP Percent: 47 %
Brady Statistic AP VS Percent: 1 %
Brady Statistic AS VP Percent: 53 %
Brady Statistic AS VS Percent: 1 %
Brady Statistic RA Percent Paced: 46 %
Brady Statistic RV Percent Paced: 99 %
Date Time Interrogation Session: 20230721020014
Implantable Lead Implant Date: 20101229
Implantable Lead Implant Date: 20101229
Implantable Lead Location: 753859
Implantable Lead Location: 753860
Implantable Pulse Generator Implant Date: 20201221
Lead Channel Impedance Value: 480 Ohm
Lead Channel Impedance Value: 540 Ohm
Lead Channel Pacing Threshold Amplitude: 0.75 V
Lead Channel Pacing Threshold Amplitude: 0.75 V
Lead Channel Pacing Threshold Pulse Width: 0.5 ms
Lead Channel Pacing Threshold Pulse Width: 0.5 ms
Lead Channel Sensing Intrinsic Amplitude: 11.4 mV
Lead Channel Sensing Intrinsic Amplitude: 3.5 mV
Lead Channel Setting Pacing Amplitude: 1 V
Lead Channel Setting Pacing Amplitude: 1.75 V
Lead Channel Setting Pacing Pulse Width: 0.5 ms
Lead Channel Setting Sensing Sensitivity: 4 mV
Pulse Gen Model: 2272
Pulse Gen Serial Number: 9190527

## 2022-01-17 NOTE — Progress Notes (Signed)
[facility] - Clinic Note  01/23/2022     CHIEF COMPLAINT Patient presents for Retina Follow Up   HISTORY OF PRESENT ILLNESS: [name] [name] is a 76 y.o. male who presents to the clinic today for:   HPI     Retina Follow Up   Patient presents with  CRVO/BRVO.  In right eye.  This started 12 weeks ago.  I, the attending physician,  performed the HPI with the patient and updated documentation appropriately.        Comments   Patient here for 12 weeks retina follow up for  BRVO OD. Patient states vision doing good. It is fine. Tiny bit of irritation. Eyes are dry. No eye pain.       Last edited by [name], MD on 01/23/2022 11:41 AM.    Pt states VA is stable, no new floaters or FOL noted.   Referring physician: No referring provider defined for this encounter.  HISTORICAL INFORMATION:   Selected notes from the MEDICAL RECORD NUMBER Referred by [name] for concern of vein occlusion OD LEE:  Ocular Hx- PMH-   CURRENT MEDICATIONS: Current Outpatient Medications (Ophthalmic Drugs)  Medication Sig   prednisoLONE acetate (PRED FORTE) 1 % ophthalmic suspension Place 1 drop into the right eye 4 (four) times daily.   No current facility-administered medications for this visit. (Ophthalmic Drugs)   Current Outpatient Medications (Other)  Medication Sig   acetaminophen (TYLENOL) 500 MG tablet Take 500-1,000 mg by mouth every 6 (six) hours as needed for moderate pain or headache.   Cyanocobalamin (B-12 PO) Take 1 capsule by mouth daily.   finasteride (PROSCAR) 5 MG tablet Take 5 mg by mouth daily.   levothyroxine (SYNTHROID, LEVOTHROID) 100 MCG tablet TAKE 1 TABLET BY MOUTH ONCE DAILY   MELATONIN PO Take 1 tablet by mouth at bedtime.   Multiple Vitamin (MULTIVITAMIN WITH MINERALS) TABS tablet Take 1 tablet by mouth daily.   pravastatin (PRAVACHOL) 40 MG tablet TAKE 1 TABLET BY MOUTH EVERY DAY   sertraline (ZOLOFT) 100 MG tablet Take 1.5  tablets (150 mg total) by mouth daily.   sildenafil (VIAGRA) 100 MG tablet Take 50-100 mg by mouth daily as needed.   tamsulosin (FLOMAX) 0.4 MG CAPS capsule Take by mouth.   VITAMIN D PO Take 1 capsule by mouth daily.   No current facility-administered medications for this visit. (Other)   REVIEW OF SYSTEMS: ROS   Positive for: Genitourinary, Cardiovascular, Eyes Negative for: Constitutional, Gastrointestinal, Neurological, Skin, Musculoskeletal, HENT, Endocrine, Respiratory, Psychiatric, Allergic/Imm, Heme/Lymph Last edited by Laddie Aquas, COA on 01/23/2022  9:51 AM.      ALLERGIES Allergies  Allergen Reactions   Egg White (Diagnostic)     Showed up on allergy test   Penicillins     Did it involve swelling of the face/tongue/throat, SOB, or low BP? No Did it involve sudden or severe rash/hives, skin peeling, or any reaction on the inside of your mouth or nose? No Did you need to seek medical attention at a hospital or doctor's office? Unknown When did it last happen?      Childhood allergy If all above answers are "NO", may proceed with cephalosporin use.    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   Cataract    Mixed form OU   History of skin cancer 04/29/2017   History of skin cancer 04/29/2017   Hypertensive retinopathy    OU   Hypothyroidism 04/29/2017   Itching 04/29/2017   Scabies 05/28/2017   Past Surgical History:  Procedure Laterality Date   PPM GENERATOR CHANGEOUT N/A 06/14/2019   Procedure: PPM GENERATOR CHANGEOUT;  Surgeon: [name], MD;  Location: [facility];  Service: Cardiovascular;  Laterality: N/A;   FAMILY HISTORY Family History  Problem Relation Age of Onset   Hypertension Brother    SOCIAL HISTORY Social History   Tobacco Use   Smoking  status: Never   Smokeless tobacco: Never  Vaping Use   Vaping Use: Never used  Substance Use Topics   Alcohol use: Yes   Drug use: No       OPHTHALMIC EXAM: Base Eye Exam     Visual Acuity (Snellen - Linear)       Right Left   Dist Amer[place] 20/20 -1 20/20         Tonometry (Tonopen, 9:49 AM)       Right Left   Pressure 15 13         Pupils       Dark Light Shape React APD   Right 3 2 Round Brisk None   Left 3 2 Round Brisk None         Visual Fields (Counting fingers)       Left Right    Full Full         Extraocular Movement       Right Left    Full, Ortho Full, Ortho         Neuro/Psych     Oriented x3: Yes   Mood/Affect: Normal         Dilation     Both eyes: 1.0% Mydriacyl, 2.5% Phenylephrine @ 9:48 AM           Slit Lamp and Fundus Exam     Slit Lamp Exam       Right Left   Lids/Lashes Dermatochalasis - upper lid, Meibomian gland dysfunction, inflammed Hordeolum - lower lid - improving Dermatochalasis - upper lid, Meibomian gland dysfunction   Conjunctiva/Sclera White and quiet White and quiet   Cornea trace Punctate epithelial erosions,trace  EBMD Trace Punctate epithelial erosions   Anterior Chamber Deep and quiet Deep and quiet   Iris Round and dilated Round and dilated   Lens 2-3+ Nuclear sclerosis, 2-3+ Cortical cataract 2-3+ Nuclear sclerosis, 2-3+ Cortical cataract   Anterior Vitreous Vitreous syneresis, Posterior vitreous detachment, vitreous condensations Vitreous syneresis, Posterior vitreous detachment         Fundus Exam       Right Left   Disc Pink and Sharp Pink and sharp, mild PPA, mild, focal PPP temporal   C/D Ratio 0.4 0.5   Macula Good foveal reflex, stable improvement of cystic changes/edema IT to fovea -- resolved, prominent MA's temporal and inferior to fovea -- stably improved, mild focal laser changes, no heme Flat, good foveal reflex, retinal pigment epithelial mottling, No heme or edema   Vessels  attenuated, mild tortuosity attenuated, tortuous   Periphery Attached, no heme; pigmented operculated hole @ 0700 midzone--no SRF Attached, no RT/RD           IMAGING AND PROCEDURES  Imaging and Procedures for @TODAY @  OCT, Retina - OU - Both Eyes  Right Eye Quality was good. Central Foveal Thickness: 327. Progression has improved. Findings include normal foveal contour, no IRF, no SRF (Interval improvement in IRF/cystic changes inferior fovea and macula--essentially resolved).   Left Eye Quality was good. Central Foveal Thickness: 326. Progression has been stable. Findings include normal foveal contour, no IRF, no SRF (Inteval release of partial PVD).   Notes *Images captured and stored on drive  Diagnosis / Impression:  OD: BRVO w/ Interval improvement in IRF/cystic changes inferior fovea and macula-essentially resolved OS: NFP, no IRF/SRF, interval release of partial PVD  Clinical management:  See below  Abbreviations: NFP - Normal foveal profile. CME - cystoid macular edema. PED - pigment epithelial detachment. IRF - intraretinal fluid. SRF - subretinal fluid. EZ - ellipsoid zone. ERM - epiretinal membrane. ORA - outer retinal atrophy. ORT - outer retinal tubulation. SRHM - subretinal hyper-reflective material      Repair Retinal Breaks, Laser - OD - Right Eye       LASER PROCEDURE NOTE  Procedure:  Barrier laser retinopexy using slit lamp laser, RIGHT eye   Diagnosis:   Operculated retinal hole, RIGHT eye                     0700 o'clock midzone   Surgeon: [name], MD, PhD  Anesthesia: Topical  Informed consent obtained, operative eye marked, and time out performed prior to initiation of laser.   Laser settings:  Lumenis Smart532 laser, slit lamp Lens: Mainster PRP 165 Power: 320 mW Spot size: 200 microns Duration: 30 msec  # spots: 113  Placement of laser: Using a Mainster PRP 165 contact lens at the slit lamp, laser was placed in three  confluent rows around small operculated hole at 0700 oclock midzone.  Complications: None.  Patient tolerated the procedure well and received written and verbal post-procedure care information/education.            ASSESSMENT/PLAN:    ICD-10-CM   1. Branch retinal vein occlusion of right eye with macular edema  H34.8310 OCT, Retina - OU - Both Eyes    2. Essential hypertension  I10     3. Hypertensive retinopathy of both eyes  H35.033     4. Retinal hole of right eye  H33.321 Repair Retinal Breaks, Laser - OD - Right Eye    5. Posterior vitreous detachment of both eyes  H43.813     6. Combined forms of age-related cataract of both eyes  H25.813       1. BRVO with CME OD  - FA 5.14.21 shows +focal telangectasias with late leakage inferior macula consistent with remote inf BRVO  - S/P IVA OD #1 (05.14.21), #2 (06.14.21), #3 (07.16.21) -- IVA resistance  - s/p IVE OD #1 (08.18.21) -- sample, #2 (09.15.21), #3 (10.18.21), #4 (11.17.21), #5 (12.15.21), #6 (01.14.22), #7 (02.11.22), #8 (03.18.22), #9 (04.15.22), #10 (06.10.22), #11 (07.22.22), #12 (09.09.22), #13 (11.04.22), #14 (12.30.22), #15 (02.24.23)  - s/p focal laser OD (05.13.22)  - BCVA 20/20 OD (stable)  - OCT shows BRVO w/ interval improvement in cystic changes - essentially resolved at 12 wks  - recommend holding IVE OD today, 08.02.23 w/ f/u in 2 wks for retinal hole / laser OD as abelow  - pt in agreement to hold injection  - [place] informed consent form re-signed and scanned on 05.10.2023  - [email or web address] benefits investigation started, 08.18.21 -- approved as of 09.15.21 -- secondary ins pays -- verified for 2023  - F/U 2 weeks --  DFE/OCT  2,3. Hypertensive retinopathy OU  - discussed importance of tight BP control  -  monitor    4. Operculated retinal hole OD - pt with long-standing history of PVD OD - pt asymptomatic --- no flashes / floaters   - The incidence, risk factors, and natural history of retinal tear  was discussed with patient.   - Potential treatment options including laser retinopexy and cryotherapy discussed with patient. - small, pigmented operculated hole at 0700 midzone OD - recommend laser retinopexy OD today, 08.02.23, pt in agreement.  - RBA of procedure discussed, questions answered - informed consent obtained and signed 08.02.23 - see procedure note - start PF QID OD x7 days - f/u in 2-3 wks   5. PVD OU  - OD: long standing, asymptomatic  - OS: interval release of partial PVD on OCT--noted on 08.02.23, asymptomatic  - Discussed findings and prognosis  - No RT or RD on 360 peripheral exam OS  - Reviewed s/s of RT/RD  - Strict return precautions for any such RT/RD signs/symptoms  - monitor   6. Mixed form cataracts OU  - The symptoms of cataract, surgical options, and treatments and risks were discussed with patient.  - discussed diagnosis and progression  - not yet visually significant  - monitor for now   Ophthalmic Meds Ordered this visit:  Meds ordered this encounter  Medications   prednisoLONE acetate (PRED FORTE) 1 % ophthalmic suspension    Sig: Place 1 drop into the right eye 4 (four) times daily.    Dispense:  10 mL    Refill:  0     Return for 2-3 wks for s/p retinopexy OD , DFE, OCT.  There are no Patient Instructions on file for this visit.  This document serves as a record of services personally performed by Karie Chimera, MD, PhD. It was created on their behalf by De Blanch, an ophthalmic technician. The creation of this record is the provider's dictation and/or activities during the visit.    Electronically signed by: De Blanch, OA, 01/23/22  11:51 AM  This document serves as a record of services personally performed by Karie Chimera, MD, PhD. It was created on their behalf by De Blanch, an ophthalmic technician. The creation of this record is the provider's dictation and/or activities during the visit.     Electronically signed by: De Blanch, OA, 01/23/22  11:51 AM   Karie Chimera, M.D., Ph.D. Diseases & Surgery of the Retina and Vitreous [facility]  I have reviewed the above documentation for accuracy and completeness, and I agree with the above. Karie Chimera, M.D., Ph.D. 01/23/22 11:51 AM   Abbreviations: M myopia (nearsighted); A astigmatism; H hyperopia (farsighted); P presbyopia; Mrx spectacle prescription;  CTL contact lenses; OD right eye; OS left eye; OU both eyes  XT exotropia; ET esotropia; PEK punctate epithelial keratitis; PEE punctate epithelial erosions; DES dry eye syndrome; MGD meibomian gland dysfunction; ATs artificial tears; PFAT's preservative free artificial tears; NSC nuclear sclerotic cataract; PSC posterior subcapsular cataract; ERM epi-retinal membrane; PVD posterior vitreous detachment; RD retinal detachment; DM diabetes mellitus; DR diabetic retinopathy; NPDR non-proliferative diabetic retinopathy; PDR proliferative diabetic retinopathy; CSME clinically significant macular edema; DME diabetic macular edema; dbh dot blot hemorrhages; CWS cotton wool spot; POAG primary open angle glaucoma; C/D cup-to-disc ratio; HVF humphrey visual field; GVF goldmann visual field; OCT optical coherence tomography; IOP intraocular pressure; BRVO Branch retinal vein occlusion; CRVO central retinal vein  occlusion; CRAO central retinal artery occlusion; BRAO branch retinal artery occlusion; RT retinal tear; SB scleral buckle; PPV pars plana vitrectomy; VH Vitreous hemorrhage; PRP panretinal laser photocoagulation; IVK intravitreal kenalog; VMT vitreomacular traction; MH Macular hole;  NVD neovascularization of the disc; NVE neovascularization elsewhere; AREDS age related eye disease study; ARMD age related macular degeneration; POAG primary open angle glaucoma; EBMD epithelial/anterior basement membrane dystrophy; ACIOL anterior chamber intraocular lens; IOL  intraocular lens; PCIOL posterior chamber intraocular lens; Phaco/IOL phacoemulsification with intraocular lens placement; [place] photorefractive keratectomy; LASIK laser assisted in situ keratomileusis; HTN hypertension; DM diabetes mellitus; COPD chronic obstructive pulmonary disease

## 2022-01-23 ENCOUNTER — Encounter (INDEPENDENT_AMBULATORY_CARE_PROVIDER_SITE_OTHER): Payer: Self-pay | Admitting: Ophthalmology

## 2022-01-23 ENCOUNTER — Ambulatory Visit (INDEPENDENT_AMBULATORY_CARE_PROVIDER_SITE_OTHER): Payer: Medicare Other | Admitting: Ophthalmology

## 2022-01-23 DIAGNOSIS — H43813 Vitreous degeneration, bilateral: Secondary | ICD-10-CM | POA: Diagnosis not present

## 2022-01-23 DIAGNOSIS — H33321 Round hole, right eye: Secondary | ICD-10-CM | POA: Diagnosis not present

## 2022-01-23 DIAGNOSIS — I10 Essential (primary) hypertension: Secondary | ICD-10-CM | POA: Diagnosis not present

## 2022-01-23 DIAGNOSIS — H25813 Combined forms of age-related cataract, bilateral: Secondary | ICD-10-CM

## 2022-01-23 DIAGNOSIS — H34831 Tributary (branch) retinal vein occlusion, right eye, with macular edema: Secondary | ICD-10-CM | POA: Diagnosis not present

## 2022-01-23 DIAGNOSIS — H35033 Hypertensive retinopathy, bilateral: Secondary | ICD-10-CM | POA: Diagnosis not present

## 2022-01-23 DIAGNOSIS — H43811 Vitreous degeneration, right eye: Secondary | ICD-10-CM

## 2022-01-23 MED ORDER — PREDNISOLONE ACETATE 1 % OP SUSP: 1.0000 [drp] | Freq: Four times a day (QID) | OPHTHALMIC | 0 refills | Status: DC

## 2022-01-28 NOTE — Progress Notes (Signed)
Remote pacemaker transmission.   

## 2022-01-30 ENCOUNTER — Encounter (INDEPENDENT_AMBULATORY_CARE_PROVIDER_SITE_OTHER): Payer: Self-pay | Admitting: Ophthalmology

## 2022-02-05 NOTE — Progress Notes (Addendum)
[facility] - Clinic Note  02/13/2022     CHIEF COMPLAINT Patient presents for Retina Follow Up   HISTORY OF PRESENT ILLNESS: [name] [name] is a 76 y.o. male who presents to the clinic today for:   HPI     Retina Follow Up   Patient presents with  CRVO/BRVO.  In right eye.  Severity is mild.  Duration of 3 weeks.  Since onset it is stable.  I, the attending physician,  performed the HPI with the patient and updated documentation appropriately.        Comments   3 week Retina eval for Brvo od. Patient states vision is the same. Patient had retinopexy od 01/23/22      Last edited by [name], MD on 02/13/2022  8:56 AM.      Referring physician: No referring provider defined for this encounter.  HISTORICAL INFORMATION:   Selected notes from the MEDICAL RECORD NUMBER Referred by [name] for concern of vein occlusion OD LEE:  Ocular Hx- PMH-   CURRENT MEDICATIONS: Current Outpatient Medications (Ophthalmic Drugs)  Medication Sig   prednisoLONE acetate (PRED FORTE) 1 % ophthalmic suspension Place 1 drop into the right eye 4 (four) times daily.   No current facility-administered medications for this visit. (Ophthalmic Drugs)   Current Outpatient Medications (Other)  Medication Sig   acetaminophen (TYLENOL) 500 MG tablet Take 500-1,000 mg by mouth every 6 (six) hours as needed for moderate pain or headache.   Cyanocobalamin (B-12 PO) Take 1 capsule by mouth daily.   finasteride (PROSCAR) 5 MG tablet Take 5 mg by mouth daily.   levothyroxine (SYNTHROID, LEVOTHROID) 100 MCG tablet TAKE 1 TABLET BY MOUTH ONCE DAILY   MELATONIN PO Take 1 tablet by mouth at bedtime.   Multiple Vitamin (MULTIVITAMIN WITH MINERALS) TABS tablet Take 1 tablet by mouth daily.   pravastatin (PRAVACHOL) 40 MG tablet TAKE 1 TABLET BY MOUTH EVERY DAY   sertraline (ZOLOFT) 100 MG tablet Take 1.5 tablets (150 mg total) by mouth daily.   sildenafil (VIAGRA) 100 MG  tablet Take 50-100 mg by mouth daily as needed.   tamsulosin (FLOMAX) 0.4 MG CAPS capsule Take by mouth.   VITAMIN D PO Take 1 capsule by mouth daily.   No current facility-administered medications for this visit. (Other)   REVIEW OF SYSTEMS: ROS   Positive for: Genitourinary, Cardiovascular, Eyes Negative for: Constitutional, Gastrointestinal, Neurological, Skin, Musculoskeletal, HENT, Endocrine, Respiratory, Psychiatric, Allergic/Imm, Heme/Lymph Last edited by [name], [name] on 02/13/2022  8:29 AM.     ALLERGIES Allergies  Allergen Reactions   Egg White (Diagnostic)     Showed up on allergy test   Penicillins     Did it involve swelling of the face/tongue/throat, SOB, or low BP? No Did it involve sudden or severe rash/hives, skin peeling, or any reaction on the inside of your mouth or nose? No Did you need to seek medical attention at a hospital or doctor's office? Unknown When did it last happen?      Childhood allergy If all above answers are "NO", may proceed with cephalosporin use.    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   Cataract    Mixed form OU  History of skin cancer 04/29/2017   History of skin cancer 04/29/2017   Hypertensive retinopathy    OU   Hypothyroidism 04/29/2017   Itching 04/29/2017   Scabies 05/28/2017   Past Surgical History:  Procedure Laterality Date   PPM GENERATOR CHANGEOUT N/A 06/14/2019   Procedure: PPM GENERATOR CHANGEOUT;  Surgeon: [name], MD;  Location: [facility];  Service: Cardiovascular;  Laterality: N/A;   FAMILY HISTORY Family History  Problem Relation Age of Onset   Hypertension Brother    SOCIAL HISTORY Social History   Tobacco Use   Smoking status: Never   Smokeless tobacco: Never  Vaping Use   Vaping Use: Never  used  Substance Use Topics   Alcohol use: Yes   Drug use: No       OPHTHALMIC EXAM: Base Eye Exam     Visual Acuity (Snellen - Linear)       Right Left   Dist [place] 20/20 20/20-2         Tonometry (Tonopen, 8:32 AM)       Right Left   Pressure 11 9         Pupils       Dark Light Shape React APD   Right 3 2 Round Brisk None   Left 3 2 Round Brisk None         Visual Fields (Counting fingers)       Left Right    Full Full         Extraocular Movement       Right Left    Full, Ortho Full, Ortho         Neuro/Psych     Oriented x3: Yes   Mood/Affect: Normal         Dilation     Both eyes: 1.0% Mydriacyl, 2.5% Phenylephrine @ 8:32 AM           Slit Lamp and Fundus Exam     Slit Lamp Exam       Right Left   Lids/Lashes Dermatochalasis - upper lid, Meibomian gland dysfunction, inflammed Hordeolum - lower lid - improving Dermatochalasis - upper lid, Meibomian gland dysfunction   Conjunctiva/Sclera White and quiet White and quiet   Cornea trace Punctate epithelial erosions,trace  EBMD Trace Punctate epithelial erosions   Anterior Chamber Deep and quiet Deep and quiet   Iris Round and dilated Round and dilated   Lens 2-3+ Nuclear sclerosis, 2-3+ Cortical cataract 2-3+ Nuclear sclerosis, 2-3+ Cortical cataract   Anterior Vitreous Vitreous syneresis, Posterior vitreous detachment, vitreous condensations Vitreous syneresis, Posterior vitreous detachment         Fundus Exam       Right Left   Disc Pink and Sharp Pink and sharp, mild PPA, mild, focal PPP temporal   C/D Ratio 0.4 0.5   Macula Flat, good foveal reflex, trace cystic changes/edema IT to fovea, prominent MA's temporal and inferior to fovea -- stably improved, mild focal laser changes, no heme Flat, good foveal reflex, retinal pigment epithelial mottling, No heme or edema   Vessels attenuated, Tortuous attenuated, tortuous   Periphery Attached, no heme; pigmented operculated hole at  0700 midzone -- no SRF -- good laser surrounding, no new RT/RD Attached, no RT/RD           IMAGING AND PROCEDURES  Imaging and Procedures for @TODAY @  OCT, Retina - OU - Both Eyes       Right Eye Quality was good. Central Foveal Thickness:  342. Progression has worsened. Findings include normal foveal contour, no IRF, no SRF (Interval increase in IRF/cystic changes inferior fovea and macula).   Left Eye Quality was good. Central Foveal Thickness: 325. Progression has been stable. Findings include normal foveal contour, no IRF, no SRF (stable release of partial PVD).   Notes *Images captured and stored on drive  Diagnosis / Impression:  OD: BRVO w/ Interval increase in IRF/cystic changes inferior fovea and macula OS: NFP, no IRF/SRF, stable release of partial PVD  Clinical management:  See below  Abbreviations: NFP - Normal foveal profile. CME - cystoid macular edema. PED - pigment epithelial detachment. IRF - intraretinal fluid. SRF - subretinal fluid. EZ - ellipsoid zone. ERM - epiretinal membrane. ORA - outer retinal atrophy. ORT - outer retinal tubulation. SRHM - subretinal hyper-reflective material      Intravitreal Injection, Pharmacologic Agent - OD - Right Eye       Time Out 02/13/2022. 8:46 AM. Confirmed correct patient, procedure, site, and patient consented.   Anesthesia Topical anesthesia was used. Anesthetic medications included Lidocaine 2%, Proparacaine 0.5%.   Procedure Preparation included 5% betadine to ocular surface, eyelid speculum. A (32g) needle was used.   Injection: 2 mg aflibercept 2 MG/0.05ML   Route: Intravitreal, Site: Right Eye   NDC: L6038910, Lot: [id number], Expiration date: 10/22/2022, Waste: 0 mL   Post-op Post injection exam found visual acuity of at least counting fingers. The patient tolerated the procedure well. There were no complications. The patient received written and verbal post procedure care education. Post injection  medications were not given.            ASSESSMENT/PLAN:    ICD-10-CM   1. Branch retinal vein occlusion of right eye with macular edema  H34.8310 OCT, Retina - OU - Both Eyes    Intravitreal Injection, Pharmacologic Agent - OD - Right Eye    aflibercept (EYLEA) SOLN 2 mg    2. Essential hypertension  I10     3. Hypertensive retinopathy of both eyes  H35.033     4. Retinal hole of right eye  H33.321     5. Posterior vitreous detachment of both eyes  H43.813     6. Combined forms of age-related cataract of both eyes  H25.813      1. BRVO with CME OD  - FA 5.14.21 shows +focal telangectasias with late leakage inferior macula consistent with remote inf BRVO  - S/P IVA OD #1 (05.14.21), #2 (06.14.21), #3 (07.16.21) -- IVA resistance  - s/p IVE OD #1 (08.18.21) -- sample, #2 (09.15.21), #3 (10.18.21), #4 (11.17.21), #5 (12.15.21), #6 (01.14.22), #7 (02.11.22), #8 (03.18.22), #9 (04.15.22), #10 (06.10.22), #11 (07.22.22), #12 (09.09.22), #13 (11.04.22), #14 (12.30.22), #15 (02.24.23)  - s/p focal laser OD (05.13.22)  - BCVA 20/20 OD (stable)  - OCT shows BRVO w/ interval increase in cystic changes -- 6 months since last injection  - recommend IVE OD #16 today, 08.23.23 for recurrent IRF/cystic changes  - pt wishes to proceed with injection  - RBA of procedure discussed, questions answered - informed consent obtained and signed - see procedure note  - Eylea informed consent form re-signed and scanned on 05.10.2023  - [email or web address] benefits investigation started, 08.18.21 -- approved as of 09.15.21 -- secondary ins pays -- verified for 2023  - F/U 3 months -- DFE/OCT  2,3. Hypertensive retinopathy OU  - discussed importance of tight BP control  -  monitor    4. Operculated retinal hole OD -  pt with long-standing history of PVD OD - pt asymptomatic --- no flashes / floaters   - small, pigmented operculated hole at 0700 midzone OD - s/p laser retinopexy OD (08.02.23) -- good laser  surrounding - f/u in 3 months, DFE, OCT   5. PVD OU  - OD: long standing, asymptomatic  - OS: interval release of partial PVD on OCT--noted on 08.02.23, asymptomatic  - Discussed findings and prognosis  - No RT or RD on 360 peripheral exam OS  - Reviewed s/s of RT/RD  - Strict return precautions for any such RT/RD signs/symptoms  - monitor   6. Mixed form cataracts OU  - The symptoms of cataract, surgical options, and treatments and risks were discussed with patient.  - discussed diagnosis and progression  - not yet visually significant  - monitor for now   Ophthalmic Meds Ordered this visit:  Meds ordered this encounter  Medications   aflibercept (EYLEA) SOLN 2 mg     Return in about 3 months (around 05/16/2022) for f/u BRVO OD, DFE, OCT.  There are no Patient Instructions on file for this visit.  This document serves as a record of services personally performed by Karie Chimera, MD, PhD. It was created on their behalf by De Blanch, an ophthalmic technician. The creation of this record is the provider's dictation and/or activities during the visit.    Electronically signed by: De Blanch, OA, 02/13/22  9:02 AM  This document serves as a record of services personally performed by Karie Chimera, MD, PhD. It was created on their behalf by Glee Arvin. Manson Passey, OA an ophthalmic technician. The creation of this record is the provider's dictation and/or activities during the visit.    Electronically signed by: Glee Arvin. Manson Passey, [place] 08.23.2023 9:02 AM  Karie Chimera, M.D., Ph.D. Diseases & Surgery of the Retina and Vitreous [facility]  I have reviewed the above documentation for accuracy and completeness, and I agree with the above. Karie Chimera, M.D., Ph.D. 02/13/22 9:02 AM   Abbreviations: M myopia (nearsighted); A astigmatism; H hyperopia (farsighted); P presbyopia; Mrx spectacle prescription;  CTL contact lenses; OD right eye; OS left  eye; OU both eyes  XT exotropia; ET esotropia; PEK punctate epithelial keratitis; PEE punctate epithelial erosions; DES dry eye syndrome; MGD meibomian gland dysfunction; ATs artificial tears; PFAT's preservative free artificial tears; NSC nuclear sclerotic cataract; PSC posterior subcapsular cataract; ERM epi-retinal membrane; PVD posterior vitreous detachment; RD retinal detachment; DM diabetes mellitus; DR diabetic retinopathy; NPDR non-proliferative diabetic retinopathy; PDR proliferative diabetic retinopathy; CSME clinically significant macular edema; DME diabetic macular edema; dbh dot blot hemorrhages; CWS cotton wool spot; POAG primary open angle glaucoma; C/D cup-to-disc ratio; HVF humphrey visual field; GVF goldmann visual field; OCT optical coherence tomography; IOP intraocular pressure; BRVO Branch retinal vein occlusion; CRVO central retinal vein occlusion; CRAO central retinal artery occlusion; BRAO branch retinal artery occlusion; RT retinal tear; SB scleral buckle; PPV pars plana vitrectomy; VH Vitreous hemorrhage; PRP panretinal laser photocoagulation; IVK intravitreal kenalog; VMT vitreomacular traction; MH Macular hole;  NVD neovascularization of the disc; NVE neovascularization elsewhere; AREDS age related eye disease study; ARMD age related macular degeneration; POAG primary open angle glaucoma; EBMD epithelial/anterior basement membrane dystrophy; ACIOL anterior chamber intraocular lens; IOL intraocular lens; PCIOL posterior chamber intraocular lens; Phaco/IOL phacoemulsification with intraocular lens placement; PRK photorefractive keratectomy; LASIK laser assisted in situ keratomileusis; HTN hypertension; DM diabetes mellitus; COPD chronic obstructive pulmonary disease

## 2022-02-06 ENCOUNTER — Encounter (INDEPENDENT_AMBULATORY_CARE_PROVIDER_SITE_OTHER): Payer: Medicare Other | Admitting: Ophthalmology

## 2022-02-06 DIAGNOSIS — H25813 Combined forms of age-related cataract, bilateral: Secondary | ICD-10-CM

## 2022-02-06 DIAGNOSIS — I10 Essential (primary) hypertension: Secondary | ICD-10-CM

## 2022-02-06 DIAGNOSIS — H34831 Tributary (branch) retinal vein occlusion, right eye, with macular edema: Secondary | ICD-10-CM

## 2022-02-06 DIAGNOSIS — H43813 Vitreous degeneration, bilateral: Secondary | ICD-10-CM

## 2022-02-06 DIAGNOSIS — H33321 Round hole, right eye: Secondary | ICD-10-CM

## 2022-02-06 DIAGNOSIS — H35033 Hypertensive retinopathy, bilateral: Secondary | ICD-10-CM

## 2022-02-06 DIAGNOSIS — H43811 Vitreous degeneration, right eye: Secondary | ICD-10-CM

## 2022-02-13 ENCOUNTER — Ambulatory Visit (INDEPENDENT_AMBULATORY_CARE_PROVIDER_SITE_OTHER): Payer: Medicare Other | Admitting: Ophthalmology

## 2022-02-13 ENCOUNTER — Encounter (INDEPENDENT_AMBULATORY_CARE_PROVIDER_SITE_OTHER): Payer: Self-pay | Admitting: Ophthalmology

## 2022-02-13 DIAGNOSIS — H35033 Hypertensive retinopathy, bilateral: Secondary | ICD-10-CM

## 2022-02-13 DIAGNOSIS — I10 Essential (primary) hypertension: Secondary | ICD-10-CM

## 2022-02-13 DIAGNOSIS — H33321 Round hole, right eye: Secondary | ICD-10-CM

## 2022-02-13 DIAGNOSIS — H43813 Vitreous degeneration, bilateral: Secondary | ICD-10-CM

## 2022-02-13 DIAGNOSIS — H34831 Tributary (branch) retinal vein occlusion, right eye, with macular edema: Secondary | ICD-10-CM | POA: Diagnosis not present

## 2022-02-13 DIAGNOSIS — H25813 Combined forms of age-related cataract, bilateral: Secondary | ICD-10-CM

## 2022-02-13 DIAGNOSIS — H43811 Vitreous degeneration, right eye: Secondary | ICD-10-CM

## 2022-02-13 MED ORDER — AFLIBERCEPT 2MG/0.05ML IZ SOLN FOR KALEIDOSCOPE
2.0000 mg | INTRAVITREAL | Status: AC | PRN
  Administered 2022-02-13: 2 mg via INTRAVITREAL

## 2022-02-21 ENCOUNTER — Encounter: Payer: Self-pay | Admitting: Primary Care

## 2022-02-21 ENCOUNTER — Ambulatory Visit (INDEPENDENT_AMBULATORY_CARE_PROVIDER_SITE_OTHER): Payer: Medicare Other | Admitting: Primary Care

## 2022-02-21 VITALS — BP 118/72 | HR 72 | Temp 99.3°F | Ht 74.0 in | Wt 207.6 lb

## 2022-02-21 DIAGNOSIS — N401 Enlarged prostate with lower urinary tract symptoms: Secondary | ICD-10-CM | POA: Diagnosis not present

## 2022-02-21 DIAGNOSIS — N189 Chronic kidney disease, unspecified: Secondary | ICD-10-CM | POA: Insufficient documentation

## 2022-02-21 DIAGNOSIS — E785 Hyperlipidemia, unspecified: Secondary | ICD-10-CM | POA: Diagnosis not present

## 2022-02-21 DIAGNOSIS — N183 Chronic kidney disease, stage 3 unspecified: Secondary | ICD-10-CM | POA: Diagnosis not present

## 2022-02-21 DIAGNOSIS — N138 Other obstructive and reflux uropathy: Secondary | ICD-10-CM

## 2022-02-21 DIAGNOSIS — G479 Sleep disorder, unspecified: Secondary | ICD-10-CM | POA: Diagnosis not present

## 2022-02-21 DIAGNOSIS — F32A Depression, unspecified: Secondary | ICD-10-CM

## 2022-02-21 HISTORY — DX: Other obstructive and reflux uropathy: N13.8

## 2022-02-21 HISTORY — DX: Depression, unspecified: F32.A

## 2022-02-21 HISTORY — DX: Chronic kidney disease, unspecified: N18.9

## 2022-02-21 HISTORY — DX: Hyperlipidemia, unspecified: E78.5

## 2022-02-21 NOTE — Patient Instructions (Signed)
Sleep apnea is defined as period of 10 seconds or longer when you stop breathing at night. This can happen multiple times a night. Dx sleep apnea is when this occurs more than 5 times an hour.    Mild OSA 5-15 apneic events an hour Moderate OSA 15-30 apneic events an hour Severe OSA > 30 apneic events an hour   Untreated sleep apnea puts you at higher risk for cardiac arrhythmias, pulmonary HTN, stroke and diabetes   Treatment options include weight loss, side sleeping position, oral appliance, CPAP therapy or referral to ENT for possible surgical options    Recommendations: Focus on side sleeping position Do not drive if experiencing excessive daytime sleepiness of fatigue  If sleep study is negative for sleep apnea, will recommend you return to see urologist    Orders: In-lab sleep study re: sleep disruption/ at risk for sleep apnea    Follow-up: Please call to schedule follow-up with [facility] NP 1-2 weeks after completing home sleep study to review results and treatment if needed   Sleep Apnea Sleep apnea affects breathing during sleep. It causes breathing to stop for 10 seconds or more, or to become shallow. People with sleep apnea usually snore loudly. It can also increase the risk of: Heart attack. Stroke. Being very overweight (obese). Diabetes. Heart failure. Irregular heartbeat. High blood pressure. The goal of treatment is to help you breathe normally again. What are the causes?  The most common cause of this condition is a collapsed or blocked airway. There are three kinds of sleep apnea: Obstructive sleep apnea. This is caused by a blocked or collapsed airway. Central sleep apnea. This happens when the brain does not send the right signals to the muscles that control breathing. Mixed sleep apnea. This is a combination of obstructive and central sleep apnea. What increases the risk? Being overweight. Smoking. Having a small airway. Being older. Being  male. Drinking alcohol. Taking medicines to calm yourself (sedatives or tranquilizers). Having family members with the condition. Having a tongue or tonsils that are larger than normal. What are the signs or symptoms? Trouble staying asleep. Loud snoring. Headaches in the morning. Waking up gasping. Dry mouth or sore throat in the morning. Being sleepy or tired during the day. If you are sleepy or tired during the day, you may also: Not be able to focus your mind (concentrate). Forget things. Get angry a lot and have mood swings. Feel sad (depressed). Have changes in your personality. Have less interest in sex, if you are male. Be unable to have an erection, if you are male. How is this treated?  Sleeping on your side. Using a medicine to get rid of mucus in your nose (decongestant). Avoiding the use of alcohol, medicines to help you relax, or certain pain medicines (narcotics). Losing weight, if needed. Changing your diet. Quitting smoking. Using a machine to open your airway while you sleep, such as: An oral appliance. This is a mouthpiece that shifts your lower jaw forward. A CPAP device. This device blows air through a mask when you breathe out (exhale). An EPAP device. This has valves that you put in each nostril. A BIPAP device. This device blows air through a mask when you breathe in (inhale) and breathe out. Having surgery if other treatments do not work. Follow these instructions at home: Lifestyle Make changes that your doctor recommends. Eat a healthy diet. Lose weight if needed. Avoid alcohol, medicines to help you relax, and some pain medicines. Do not  smoke or use any products that contain nicotine or tobacco. If you need help quitting, ask your doctor. General instructions Take over-the-counter and prescription medicines only as told by your doctor. If you were given a machine to use while you sleep, use it only as told by your doctor. If you are having  surgery, make sure to tell your doctor you have sleep apnea. You may need to bring your device with you. Keep all follow-up visits. Contact a doctor if: The machine that you were given to use during sleep bothers you or does not seem to be working. You do not get better. You get worse. Get help right away if: Your chest hurts. You have trouble breathing in enough air. You have an uncomfortable feeling in your back, arms, or stomach. You have trouble talking. One side of your body feels weak. A part of your face is hanging down. These symptoms may be an emergency. Get help right away. Call your local emergency services (911 in the [place] Do not wait to see if the symptoms will go away. Do not drive yourself to the hospital. Summary This condition affects breathing during sleep. The most common cause is a collapsed or blocked airway. The goal of treatment is to help you breathe normally while you sleep. This information is not intended to replace advice given to you by your health care provider. Make sure you discuss any questions you have with your health care provider. Document Revised: 01/17/2021 Document Reviewed: 05/19/2020 Elsevier Patient Education  2023 [employer].

## 2022-02-21 NOTE — Progress Notes (Signed)
@Patient  ID: , male    DOB: 1946/05/29, 76 y.o.   MRN: [id number]  Chief Complaint  Patient presents with   Consult    Referring provider: [id number], MD  HPI: 76 year old male, never smoked.  Past medical history significant for complete heart block, hypothyroidism, BPH with urinary obstruction, chronic kidney disease stage III, hyperlipidemia, major depression, thrombocytopenia.  02/21/2022 Patient presents today for sleep consult. His sleep is fragmented and restless. Recently he has felt very tired when he wakes up in the morning.  Unsure if he snores. He wakes up multiple times at night to use the restroom. He does have history of BPH with urinary obstruction, following with urology.  Difficult bedtime is between 10 and 11 PM.  It takes him 20 minutes to fall asleep.  He wakes up on average 5-6 times a night.  He starts his day at 6 AM.  No previous sleep studies.  He does not wear CPAP or oxygen.  Epworth score 9.  Sleep questionnaire Symptoms-  Sleep disruption/fragmented sleep, non-restorative sleep, daytime sleepiness  Prior sleep study- None Bedtime- 10-11pm Time to fall asleep- 20 mins  Nocturnal awakenings- 5-6  Out of bed/start of day- 6am Weight changes- No change  Do you operate heavy machinery- No Do you currently wear CPAP- No Do you current wear oxygen- No Epworth- 9  Allergies  Allergen Reactions   Egg White (Diagnostic)     Showed up on allergy test   Penicillins     Did it involve swelling of the face/tongue/throat, SOB, or low BP? No Did it involve sudden or severe rash/hives, skin peeling, or any reaction on the inside of your mouth or nose? No Did you need to seek medical attention at a hospital or doctor's office? Unknown When did it last happen?      Childhood allergy If all above answers are "NO", may proceed with cephalosporin use.    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   Cataract    Mixed form OU   Chronic kidney disease 02/21/2022   Depression 02/21/2022   History of skin cancer 04/29/2017   History of skin cancer 04/29/2017   Hyperlipidemia 02/21/2022   Hypertensive retinopathy    OU   Hypothyroidism 04/29/2017   Itching 04/29/2017   Scabies 05/28/2017    Tobacco History: Social History   Tobacco Use  Smoking Status Never   Passive exposure: Never  Smokeless Tobacco Never   Counseling given: Not Answered   Outpatient Medications Prior to Visit  Medication Sig Dispense Refill   acetaminophen (TYLENOL) 500 MG tablet Take 500-1,000 mg by mouth every 6 (six) hours as needed for moderate pain or headache.     Cyanocobalamin (B-12 PO) Take 1 capsule by mouth daily.     finasteride (PROSCAR) 5 MG tablet Take 5 mg by mouth daily.     levothyroxine (SYNTHROID, LEVOTHROID) 100 MCG tablet TAKE 1 TABLET BY MOUTH ONCE DAILY 90 tablet 0   MELATONIN PO Take 1 tablet by mouth at bedtime.  Multiple Vitamin (MULTIVITAMIN WITH MINERALS) TABS tablet Take 1 tablet by mouth daily.     pravastatin (PRAVACHOL) 40 MG tablet TAKE 1 TABLET BY MOUTH EVERY DAY 90 tablet 3   sertraline (ZOLOFT) 100 MG tablet Take 1.5 tablets (150 mg total) by mouth daily. 135 tablet 0   sildenafil (VIAGRA) 100 MG tablet Take 50-100 mg by mouth daily as needed.     tamsulosin (FLOMAX) 0.4 MG CAPS capsule Take by mouth.     VITAMIN D PO Take 1 capsule by mouth daily.     prednisoLONE acetate (PRED FORTE) 1 % ophthalmic suspension Place 1 drop into the right eye 4 (four) times daily. 10 mL 0   No facility-administered medications prior to  visit.   Review of Systems  Review of Systems  Constitutional:  Positive for fatigue.  HENT: Negative.    Respiratory: Negative.    Psychiatric/Behavioral:  Positive for sleep disturbance.     Physical Exam  BP 118/72 (BP Location: Right Arm, Patient Position: Sitting, Cuff Size: Large)   Pulse 72   Temp 99.3 F (37.4 C) (Oral)   Ht 6\' 2"  (1.88 m)   Wt 207 lb 9.6 oz (94.2 kg)   SpO2 94%   BMI 26.65 kg/m  Physical Exam Constitutional:      Appearance: Normal appearance.  HENT:     Head: Normocephalic and atraumatic.     Mouth/Throat:     Mouth: Mucous membranes are moist.     Pharynx: Oropharynx is clear.     Comments: Mallampati class I Neck:     Comments: Neck circumference 16 Cardiovascular:     Rate and Rhythm: Normal rate and regular rhythm.  Pulmonary:     Effort: Pulmonary effort is normal.     Breath sounds: Normal breath sounds.  Musculoskeletal:        General: Normal range of motion.     Cervical back: Normal range of motion and neck supple.  Skin:    General: Skin is warm and dry.  Neurological:     General: No focal deficit present.     Mental Status: He is alert and oriented to person, place, and time. Mental status is at baseline.  Psychiatric:        Mood and Affect: Mood normal.        Behavior: Behavior normal.        Thought Content: Thought content normal.        Judgment: Judgment normal.      Lab Results:  CBC    Component Value Date/Time   WBC 6.8 06/10/2019 1629   RBC 4.82 06/10/2019 1629   HGB 14.3 06/10/2019 1629   HCT 43.6 06/10/2019 1629   PLT 150 06/10/2019 1629   MCV 91 06/10/2019 1629   MCH 29.7 06/10/2019 1629   MCHC 32.8 06/10/2019 1629   RDW 12.3 06/10/2019 1629    BMET    Component Value Date/Time   NA 144 06/10/2019 1629   K 4.2 06/10/2019 1629   CL 103 06/10/2019 1629   CO2 29 06/10/2019 1629   GLUCOSE 109 (H) 06/10/2019 1629   BUN 22 06/10/2019 1629   CREATININE 1.16 06/10/2019 1629   CALCIUM 9.7  06/10/2019 1629   GFRNONAA 63 06/10/2019 1629   GFRAA 72 06/10/2019 1629    BNP No results found for: "BNP"  ProBNP No results found for: "PROBNP"  Imaging: Intravitreal Injection, Pharmacologic Agent - OD - Right Eye  Result Date: 02/13/2022 Time Out 02/13/2022.  8:46 AM. Confirmed correct patient, procedure, site, and patient consented. Anesthesia Topical anesthesia was used. Anesthetic medications included Lidocaine 2%, Proparacaine 0.5%. Procedure Preparation included 5% betadine to ocular surface, eyelid speculum. A (32g) needle was used. Injection: 2 mg aflibercept 2 MG/0.05ML   Route: Intravitreal, Site: Right Eye   NDC: [id number], Lot: [id number], Expiration date: 10/22/2022, Waste: 0 mL Post-op Post injection exam found visual acuity of at least counting fingers. The patient tolerated the procedure well. There were no complications. The patient received written and verbal post procedure care education. Post injection medications were not given.   OCT, Retina - OU - Both Eyes  Result Date: 02/13/2022 Right Eye Quality was good. Central Foveal Thickness: 342. Progression has worsened. Findings include normal foveal contour, no IRF, no SRF (Interval increase in IRF/cystic changes inferior fovea and macula). Left Eye Quality was good. Central Foveal Thickness: 325. Progression has been stable. Findings include normal foveal contour, no IRF, no SRF (stable release of partial PVD). Notes *Images captured and stored on drive Diagnosis / Impression: OD: BRVO w/ Interval increase in IRF/cystic changes inferior fovea and macula OS: NFP, no IRF/SRF, stable release of partial PVD Clinical management: See below Abbreviations: NFP - Normal foveal profile. CME - cystoid macular edema. PED - pigment epithelial detachment. IRF - intraretinal fluid. SRF - subretinal fluid. EZ - ellipsoid zone. ERM - epiretinal membrane. ORA - outer retinal atrophy. ORT - outer retinal tubulation. SRHM - subretinal  hyper-reflective material   Repair Retinal Breaks, Laser - OD - Right Eye  Result Date: 01/23/2022 LASER PROCEDURE NOTE Procedure:  Barrier laser retinopexy using slit lamp laser, RIGHT eye Diagnosis:   Operculated retinal hole, RIGHT eye                     0700 o'clock midzone Surgeon: [name], MD, PhD Anesthesia: Topical Informed consent obtained, operative eye marked, and time out performed prior to initiation of laser. Laser settings: Lumenis [id number] laser, slit lamp Lens: Mainster PRP 165 Power: 320 mW Spot size: 200 microns Duration: 30 msec # spots: 113 Placement of laser: Using a Mainster PRP 165 contact lens at the slit lamp, laser was placed in three confluent rows around small operculated hole at 0700 oclock midzone. Complications: None. Patient tolerated the procedure well and received written and verbal post-procedure care information/education.   OCT, Retina - OU - Both Eyes  Result Date: 01/23/2022 Right Eye Quality was good. Central Foveal Thickness: 327. Progression has improved. Findings include normal foveal contour, no IRF, no SRF (Interval improvement in IRF/cystic changes inferior fovea and macula--essentially resolved). Left Eye Quality was good. Central Foveal Thickness: 326. Progression has been stable. Findings include normal foveal contour, no IRF, no SRF (Inteval release of partial PVD). Notes *Images captured and stored on drive Diagnosis / Impression: OD: BRVO w/ Interval improvement in IRF/cystic changes inferior fovea and macula-essentially resolved OS: NFP, no IRF/SRF, interval release of partial PVD Clinical management: See below Abbreviations: NFP - Normal foveal profile. CME - cystoid macular edema. PED - pigment epithelial detachment. IRF - intraretinal fluid. SRF - subretinal fluid. EZ - ellipsoid zone. ERM - epiretinal membrane. ORA - outer retinal atrophy. ORT - outer retinal tubulation. SRHM - subretinal hyper-reflective material     Assessment & Plan:    Sleep disturbance - Patient has symptoms of restless and fragmented sleep.  Associated daytime fatigue. He wakes up on average 5-6 times a night.  Epworth score is 9.  BMI 26. Concern  for underlying sleep apnea causing disrupted sleep, patient needs in-lab polysomnography d/t underlying cardiac history.  Reviewed risks of untreated sleep apnea including cardiac arrhythmias, pulmonary hypertension, stroke and diabetes.  We also discussed treatment options including weight loss, oral appliance, CPAP therapy or referral to ENT for possible surgical options.,  And patient continue to focus on side sleeping position.  Advised patient avoid driving if experiencing excessive daytime sleepiness or fatigue.  If sleep study is negative recommend patient return to see urology due to nocturnal awakenings possibly related to BPH.  Follow-up 1 to 2 weeks after sleep study completed to review results and treatment options if needed.   Martyn Ehrich, NP 02/21/2022

## 2022-02-21 NOTE — Assessment & Plan Note (Signed)
-   Patient has symptoms of restless and fragmented sleep.  Associated daytime fatigue. He wakes up on average 5-6 times a night.  Epworth score is 9.  BMI 26. Concern for underlying sleep apnea causing disrupted sleep, patient needs in-lab polysomnography d/t underlying cardiac history.  Reviewed risks of untreated sleep apnea including cardiac arrhythmias, pulmonary hypertension, stroke and diabetes.  We also discussed treatment options including weight loss, oral appliance, CPAP therapy or referral to ENT for possible surgical options.,  And patient continue to focus on side sleeping position.  Advised patient avoid driving if experiencing excessive daytime sleepiness or fatigue.  If sleep study is negative recommend patient return to see urology due to nocturnal awakenings possibly related to BPH.  Follow-up 1 to 2 weeks after sleep study completed to review results and treatment options if needed.

## 2022-02-21 NOTE — Progress Notes (Signed)
Reviewed and agree with assessment/plan.   Josephene Marrone, MD [facility] 02/21/2022, 12:44 PM Pager:  [phone]  

## 2022-02-28 ENCOUNTER — Ambulatory Visit (HOSPITAL_BASED_OUTPATIENT_CLINIC_OR_DEPARTMENT_OTHER): Payer: Medicare Other | Attending: Primary Care | Admitting: Internal Medicine

## 2022-02-28 DIAGNOSIS — R351 Nocturia: Secondary | ICD-10-CM | POA: Diagnosis not present

## 2022-02-28 DIAGNOSIS — Z950 Presence of cardiac pacemaker: Secondary | ICD-10-CM | POA: Insufficient documentation

## 2022-02-28 DIAGNOSIS — G479 Sleep disorder, unspecified: Secondary | ICD-10-CM | POA: Diagnosis present

## 2022-02-28 DIAGNOSIS — R0683 Snoring: Secondary | ICD-10-CM | POA: Insufficient documentation

## 2022-02-28 DIAGNOSIS — G478 Other sleep disorders: Secondary | ICD-10-CM | POA: Insufficient documentation

## 2022-03-02 DIAGNOSIS — G479 Sleep disorder, unspecified: Secondary | ICD-10-CM | POA: Diagnosis not present

## 2022-03-02 NOTE — Procedures (Signed)
       Patient Name: [name], [name] Date: 02/28/2022 Gender: Male D.O.B: [birth year] Age (years): 83 Referring Provider: Ames Dura NP Height (inches): 74 Interpreting Physician: Jetty Duhamel MD, ABSM Weight (lbs): 205 RPSGT: Armen Pickup BMI: 26 MRN: [id number] Neck Size: 17.00  CLINICAL INFORMATION Sleep Study Type: NPSG Indication for sleep study: Non-refreshing Sleep Epworth Sleepiness Score: 7  SLEEP STUDY TECHNIQUE As per the AASM Manual for the Scoring of Sleep and Associated Events v2.3 (April 2016) with a hypopnea requiring 4% desaturations.  The channels recorded and monitored were frontal, central and occipital EEG, electrooculogram (EOG), submentalis EMG (chin), nasal and oral airflow, thoracic and abdominal wall motion, anterior tibialis EMG, snore microphone, electrocardiogram, and pulse oximetry.  MEDICATIONS Medications self-administered by patient taken the night of the study : FISH OIL, BABY ASPIRIN, PRAVASTATIN, FLOMAX, Vitamin D PO, MELATONIN  SLEEP ARCHITECTURE The study was initiated at 9:22:39 PM and ended at 3:51:17 AM.  Sleep onset time was 8.0 minutes and the sleep efficiency was 85.9%%. The total sleep time was 334 minutes.  Stage REM latency was 318.0 minutes.  The patient spent 5.2%% of the night in stage N1 sleep, 77.2%% in stage N2 sleep, 1.2%% in stage N3 and 16.3% in REM.  Alpha intrusion was absent.  Supine sleep was 0.05%.  RESPIRATORY PARAMETERS The overall apnea/hypopnea index (AHI) was 0.9 per hour. There were 0 total apneas, including 0 obstructive, 0 central and 0 mixed apneas. There were 5 hypopneas and 5 RERAs.  The AHI during Stage REM sleep was 0.0 per hour.  AHI while supine was 0.0 per hour.  The mean oxygen saturation was 92.8%. The minimum SpO2 during sleep was 88.0%.  soft snoring was noted during this study.  CARDIAC DATA The 2 lead EKG demonstrated sinus rhythm, pacemaker generated. The mean heart  rate was 60.1 beats per minute. Other EKG findings include: None.  LEG MOVEMENT DATA The total PLMS were 0 with a resulting PLMS index of 0.0. Associated arousal with leg movement index was 0.9 .  IMPRESSIONS - No significant obstructive sleep apnea occurred during this study (AHI = 0.9/h). - The patient had minimal or no oxygen desaturation during the study (Min O2 = 88.0%). Mean 92.8% - The patient snored with soft snoring volume. - No cardiac abnormalities were noted during this study. - Total Limb Movements 396 (71.1/hr). Limb Movements with arousal 5 (0.9/ hr). - Bathroom- Nocturia x 4.  DIAGNOSIS - Other sleep disorder  RECOMMENDATIONS - Limb movements were not clearly associated with arousal or awakening. Consider if therapeutic trial of Requip or Mirapex would be useful. - Urology to assess nocturia sleep disturbance.. - Sleep hygiene should be reviewed to assess factors that may improve sleep quality. - Weight management and regular exercise should be initiated or continued if appropriate.  [Electronically signed] 03/02/2022 01:21 PM  Jetty Duhamel MD, ABSM Diplomate, American Board of Sleep Medicine NPI: [id number]                      Jetty Duhamel Diplomate, American Board of Sleep Medicine  ELECTRONICALLY SIGNED ON:  03/02/2022, 1:12 PM [facility] PH: [phone]   FX: [phone] ACCREDITED BY THE AMERICAN ACADEMY OF SLEEP MEDICINE

## 2022-03-06 ENCOUNTER — Ambulatory Visit (INDEPENDENT_AMBULATORY_CARE_PROVIDER_SITE_OTHER): Payer: Medicare Other | Admitting: Primary Care

## 2022-03-06 ENCOUNTER — Encounter: Payer: Self-pay | Admitting: Primary Care

## 2022-03-06 VITALS — BP 124/80 | HR 68 | Temp 98.1°F | Ht 74.0 in | Wt 205.0 lb

## 2022-03-06 DIAGNOSIS — Z23 Encounter for immunization: Secondary | ICD-10-CM

## 2022-03-06 DIAGNOSIS — R351 Nocturia: Secondary | ICD-10-CM | POA: Diagnosis not present

## 2022-03-06 DIAGNOSIS — G479 Sleep disorder, unspecified: Secondary | ICD-10-CM | POA: Diagnosis not present

## 2022-03-06 NOTE — Progress Notes (Signed)
@Patient  ID: , male    DOB: 08-Aug-1945, 76 y.o.   MRN: [id number]  Chief Complaint  Patient presents with   Follow-up    Referring provider: No ref. provider found  HPI: 76 year old male, never smoked.  Past medical history significant for complete heart block, hypothyroidism, BPH with urinary obstruction, chronic kidney disease stage III, hyperlipidemia, major depression, thrombocytopenia.  Previous [facility] pulmonary encounter: 02/21/2022 Patient presents today for sleep consult. His sleep is fragmented and restless. Recently he has felt very tired when he wakes up in the morning.  Unsure if he snores. He wakes up multiple times at night to use the restroom. He does have history of BPH with urinary obstruction, following with urology.  Difficult bedtime is between 10 and 11 PM.  It takes him 20 minutes to fall asleep.  He wakes up on average 5-6 times a night.  He starts his day at 6 AM.  No previous sleep studies.  He does not wear CPAP or oxygen.  Epworth score 9.  Sleep questionnaire Symptoms-  Sleep disruption/fragmented sleep, non-restorative sleep, daytime sleepiness  Prior sleep study- None Bedtime- 10-11pm Time to fall asleep- 20 mins  Nocturnal awakenings- 5-6  Out of bed/start of day- 6am Weight changes- No change  Do you operate heavy machinery- No Do you currently wear CPAP- No Do you current wear oxygen- No Epworth- 9  03/06/2022- Interim hx  Patient presents today to review sleep study results.  He had a sleep study on 02/28/2022 that showed no evidence of obstructive sleep apnea.  He did have periodic limb movements that were not associated with arousals.  He was noted to have nocturia, he is following with urology.  Reports no urges or sensation to move his legs at night.  PSG 02/28/22 >>> AHI 0.9/hour   Allergies  Allergen Reactions   Egg White (Diagnostic)     Showed up on allergy test   Penicillins     Did it involve swelling of the  face/tongue/throat, SOB, or low BP? No Did it involve sudden or severe rash/hives, skin peeling, or any reaction on the inside of your mouth or nose? No Did you need to seek medical attention at a hospital or doctor's office? Unknown When did it last happen?      Childhood allergy If all above answers are "NO", may proceed with cephalosporin use.    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   Cataract    Mixed form OU   Chronic kidney disease 02/21/2022   Depression 02/21/2022   History of skin cancer 04/29/2017   History of skin cancer 04/29/2017   Hyperlipidemia 02/21/2022   Hypertensive retinopathy    OU   Hypothyroidism 04/29/2017   Itching 04/29/2017   Scabies 05/28/2017    Tobacco History: Social History   Tobacco Use  Smoking Status Never   Passive exposure: Never  Smokeless Tobacco Never   Counseling given: Not Answered   Outpatient Medications Prior to Visit  Medication Sig Dispense Refill  acetaminophen (TYLENOL) 500 MG tablet Take 500-1,000 mg by mouth every 6 (six) hours as needed for moderate pain or headache.     Cyanocobalamin (B-12 PO) Take 1 capsule by mouth daily.     finasteride (PROSCAR) 5 MG tablet Take 5 mg by mouth daily.     levothyroxine (SYNTHROID, LEVOTHROID) 100 MCG tablet TAKE 1 TABLET BY MOUTH ONCE DAILY 90 tablet 0   MELATONIN PO Take 1 tablet by mouth at bedtime.     Multiple Vitamin (MULTIVITAMIN WITH MINERALS) TABS tablet Take 1 tablet by mouth daily.     pravastatin (PRAVACHOL) 40 MG tablet TAKE 1 TABLET BY  MOUTH EVERY DAY 90 tablet 3   sertraline (ZOLOFT) 100 MG tablet Take 1.5 tablets (150 mg total) by mouth daily. 135 tablet 0   sildenafil (VIAGRA) 100 MG tablet Take 50-100 mg by mouth daily as needed.     tamsulosin (FLOMAX) 0.4 MG CAPS capsule Take by mouth.     VITAMIN D PO Take 1 capsule by mouth daily.     No facility-administered medications prior to visit.    Review of Systems  Review of Systems  Constitutional: Negative.   HENT: Negative.    Respiratory: Negative.    Genitourinary:  Positive for frequency.  Psychiatric/Behavioral:  Positive for sleep disturbance.      Physical Exam  BP 124/80 (BP Location: Left Arm, Patient Position: Sitting, Cuff Size: Large)   Pulse 68   Temp 98.1 F (36.7 C) (Oral)   Ht 6\' 2"  (1.88 m)   Wt 205 lb (93 kg)   SpO2 98%   BMI 26.32 kg/m  Physical Exam Constitutional:      Appearance: Normal appearance.  HENT:     Head: Normocephalic and atraumatic.     Mouth/Throat:     Mouth: Mucous membranes are moist.     Pharynx: Oropharynx is clear.  Cardiovascular:     Rate and Rhythm: Normal rate.  Pulmonary:     Effort: Pulmonary effort is normal.  Musculoskeletal:        General: Normal range of motion.  Skin:    General: Skin is warm and dry.  Neurological:     General: No focal deficit present.     Mental Status: He is alert and oriented to person, place, and time. Mental status is at baseline.  Psychiatric:        Mood and Affect: Mood normal.        Behavior: Behavior normal.        Thought Content: Thought content normal.        Judgment: Judgment normal.      Lab Results:  CBC    Component Value Date/Time   WBC 6.8 06/10/2019 1629   RBC 4.82 06/10/2019 1629   HGB 14.3 06/10/2019 1629   HCT 43.6 06/10/2019 1629   PLT 150 06/10/2019 1629   MCV 91 06/10/2019 1629   MCH 29.7 06/10/2019 1629   MCHC 32.8 06/10/2019 1629   RDW 12.3 06/10/2019 1629    BMET    Component Value Date/Time   NA 144 06/10/2019 1629    K 4.2 06/10/2019 1629   CL 103 06/10/2019 1629   CO2 29 06/10/2019 1629   GLUCOSE 109 (H) 06/10/2019 1629   BUN 22 06/10/2019 1629   CREATININE 1.16 06/10/2019 1629   CALCIUM 9.7 06/10/2019 1629   GFRNONAA 63 06/10/2019 1629   GFRAA 72 06/10/2019 1629    BNP No results found for: "BNP"  ProBNP  No results found for: "PROBNP"  Imaging: Intravitreal Injection, Pharmacologic Agent - OD - Right Eye  Result Date: 02/13/2022 Time Out 02/13/2022. 8:46 AM. Confirmed correct patient, procedure, site, and patient consented. Anesthesia Topical anesthesia was used. Anesthetic medications included Lidocaine 2%, Proparacaine 0.5%. Procedure Preparation included 5% betadine to ocular surface, eyelid speculum. A (32g) needle was used. Injection: 2 mg aflibercept 2 MG/0.05ML   Route: Intravitreal, Site: Right Eye   NDC: [id number], Lot: [id number], Expiration date: 10/22/2022, Waste: 0 mL Post-op Post injection exam found visual acuity of at least counting fingers. The patient tolerated the procedure well. There were no complications. The patient received written and verbal post procedure care education. Post injection medications were not given.   OCT, Retina - OU - Both Eyes  Result Date: 02/13/2022 Right Eye Quality was good. Central Foveal Thickness: 342. Progression has worsened. Findings include normal foveal contour, no IRF, no SRF (Interval increase in IRF/cystic changes inferior fovea and macula). Left Eye Quality was good. Central Foveal Thickness: 325. Progression has been stable. Findings include normal foveal contour, no IRF, no SRF (stable release of partial PVD). Notes *Images captured and stored on drive Diagnosis / Impression: OD: BRVO w/ Interval increase in IRF/cystic changes inferior fovea and macula OS: NFP, no IRF/SRF, stable release of partial PVD Clinical management: See below Abbreviations: NFP - Normal foveal profile. CME - cystoid macular edema. PED - pigment epithelial  detachment. IRF - intraretinal fluid. SRF - subretinal fluid. EZ - ellipsoid zone. ERM - epiretinal membrane. ORA - outer retinal atrophy. ORT - outer retinal tubulation. SRHM - subretinal hyper-reflective material     Assessment & Plan:   Sleep disturbance - PSG on 02/28/2022 showed no evidence of obstructive sleep apnea, AHI 0.9/hour. 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 If sleep disruption continues we could trial medication such a requip for RLS. Patient to return if sleep symptoms worsen.   Nocturia - He was noted to have nocturia on sleep study, advised he follow back up with urology     Martyn Ehrich, NP 03/07/2022

## 2022-03-06 NOTE — Patient Instructions (Signed)
Sleep study on 02/28/2022 showed no evidence of obstructive sleep apnea.  You had no notable oxygen desaturations or cardiac arrhythmias.  You did have periodic leg movements that were not associated with arousals.  Did have nocturia (frequent nighttime urination), recommend you follow-up with urology.  If still having frequent sleep disruptions we could try medication for this restless leg syndrome, please call or send me a message if you would like to try this.

## 2022-03-07 DIAGNOSIS — R351 Nocturia: Secondary | ICD-10-CM | POA: Insufficient documentation

## 2022-03-07 NOTE — Assessment & Plan Note (Signed)
-   PSG on 02/28/2022 showed no evidence of obstructive sleep apnea, AHI 0.9/hour. 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 If sleep disruption continues we could trial medication such a requip for RLS. Patient to return if sleep symptoms worsen.

## 2022-03-07 NOTE — Assessment & Plan Note (Signed)
-   He was noted to have nocturia on sleep study, advised he follow back up with urology

## 2022-03-08 NOTE — Progress Notes (Signed)
Reviewed and agree with assessment/plan.   Aleah Ahlgrim, MD [facility] 03/08/2022, 8:49 AM Pager:  [phone]  

## 2022-03-29 ENCOUNTER — Encounter: Payer: Self-pay | Admitting: Internal Medicine

## 2022-03-29 ENCOUNTER — Ambulatory Visit: Payer: Medicare Other | Attending: Internal Medicine | Admitting: Internal Medicine

## 2022-03-29 VITALS — BP 138/76 | HR 79 | Ht 74.0 in | Wt 206.0 lb

## 2022-03-29 DIAGNOSIS — I442 Atrioventricular block, complete: Secondary | ICD-10-CM

## 2022-03-29 LAB — CUP PACEART INCLINIC DEVICE CHECK
Battery Remaining Longevity: 88 mo
Battery Voltage: 3.01 V
Brady Statistic RA Percent Paced: 47 %
Brady Statistic RV Percent Paced: 99.96 %
Date Time Interrogation Session: 20231006153819
Implantable Lead Implant Date: 20101229
Implantable Lead Implant Date: 20101229
Implantable Lead Location: 753859
Implantable Lead Location: 753860
Implantable Pulse Generator Implant Date: 20201221
Lead Channel Impedance Value: 450 Ohm
Lead Channel Impedance Value: 525 Ohm
Lead Channel Pacing Threshold Amplitude: 0.75 V
Lead Channel Pacing Threshold Amplitude: 0.75 V
Lead Channel Pacing Threshold Amplitude: 0.75 V
Lead Channel Pacing Threshold Amplitude: 0.75 V
Lead Channel Pacing Threshold Pulse Width: 0.5 ms
Lead Channel Pacing Threshold Pulse Width: 0.5 ms
Lead Channel Pacing Threshold Pulse Width: 0.5 ms
Lead Channel Pacing Threshold Pulse Width: 0.5 ms
Lead Channel Sensing Intrinsic Amplitude: 11.4 mV
Lead Channel Sensing Intrinsic Amplitude: 2.5 mV
Lead Channel Setting Pacing Amplitude: 0.875
Lead Channel Setting Pacing Amplitude: 1.75 V
Lead Channel Setting Pacing Pulse Width: 0.5 ms
Lead Channel Setting Sensing Sensitivity: 4 mV
Pulse Gen Model: 2272
Pulse Gen Serial Number: 9190527

## 2022-03-29 NOTE — Progress Notes (Signed)
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 He denies chest pain, or sob. No syncope. 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 He feels well and remains active. He is pending a trip to [place]. Allergies  Allergen Reactions   Egg White (Diagnostic)     Showed up on allergy test   Penicillins     Did it involve swelling of the face/tongue/throat, SOB, or low BP? No Did it involve sudden or severe rash/hives, skin peeling, or any reaction on the inside of your mouth or nose? No Did you need to seek medical attention at a hospital or doctor's office? Unknown When did it last happen?      Childhood allergy If all above answers are "NO", may proceed with cephalosporin use.    Shrimp [Shellfish Allergy]     Showed up on allergy test   Wheat Bran     Showed up on allergy test   Sulfa Antibiotics Rash     Current Outpatient Medications  Medication Sig Dispense Refill   acetaminophen (TYLENOL) 500 MG tablet Take 500-1,000 mg by mouth every 6 (six) hours as needed for moderate pain or headache.     Cyanocobalamin (B-12 PO) Take 1 capsule by mouth daily.     finasteride (PROSCAR) 5 MG tablet Take 5 mg by mouth daily.     levothyroxine (SYNTHROID, LEVOTHROID) 100 MCG tablet TAKE 1 TABLET BY MOUTH ONCE DAILY 90 tablet 0   MELATONIN PO Take 1 tablet by mouth at bedtime.     Multiple Vitamin (MULTIVITAMIN WITH MINERALS) TABS tablet Take 1 tablet by mouth daily.     pravastatin (PRAVACHOL) 40 MG tablet TAKE 1 TABLET BY MOUTH EVERY DAY 90 tablet 3   sertraline (ZOLOFT) 100 MG tablet Take 1.5 tablets (150 mg total) by mouth daily. 135 tablet 0   sildenafil (VIAGRA) 100 MG tablet Take 50-100 mg by mouth daily as needed.     tamsulosin (FLOMAX) 0.4 MG CAPS capsule Take by mouth.     VITAMIN D  PO Take 1 capsule by mouth daily.     No current facility-administered medications for this visit.     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   Past Surgical History:  Procedure Laterality Date   PPM GENERATOR CHANGEOUT N/A 06/14/2019   Procedure: PPM GENERATOR CHANGEOUT;  Surgeon: Evans Lance, MD;  Location: [facility];  Service: Cardiovascular;  Laterality: N/A;     Family History  Problem Relation Age of Onset   Hypertension Brother      Social History   Socioeconomic History   Marital status: Divorced    Spouse name: Not on file   Number of children: Not on file   Years of education: Not on file  Highest education level: Not on file  Occupational History   Not on file  Tobacco Use   Smoking status: Never    Passive exposure: Never   Smokeless tobacco: Never  Vaping Use   Vaping Use: Never used  Substance and Sexual Activity   Alcohol use: Yes   Drug use: No   Sexual activity: Not on file  Other Topics Concern   Not on file  Social History Narrative   Not on file   Social Determinants of Health   Financial Resource Strain: Not on file  Food Insecurity: Not on file  Transportation Needs: Not on file  Physical Activity: Not on file  Stress: Not on file  Social Connections: Not on file  Intimate Partner Violence: Not on file     BP 138/76   Pulse 79   Ht 6\' 2"  (1.88 m)   Wt 206 lb (93.4 kg)   SpO2 97%   BMI 26.45 kg/m   Physical Exam:  Well appearing NAD HEENT: Unremarkable Neck:  No JVD,  no thyromegally Lymphatics:  No adenopathy Back:  No CVA tenderness Lungs:  Clear with no wheezes HEART:  Regular rate rhythm, no murmurs, no rubs, no clicks Abd:  soft, positive bowel sounds, no organomegally, no rebound, no guarding Ext:  2 plus pulses, no edema, no cyanosis, no clubbing;well healed scar Skin:  No rashes no nodules Neuro:  CN II through XII intact, motor grossly intact  EKG - nsr with p synchronous ventricular pacing  DEVICE  Normal device function.  See PaceArt for details.   Assess/Plan:  1. CHB - he is asymptomatic, s/p PPM insertion. 2. PPM - his St. Jude DDD PM is working normally.  3. Dyslipidemia - continue statin therapy. 4. Sarcoma - he is s/p removal. No evidence of recurrence.   Carleene Overlie Genesis Novosad,MD

## 2022-03-29 NOTE — Patient Instructions (Signed)
Medication Instructions:  Your physician recommends that you continue on your current medications as directed. Please refer to the Current Medication list given to you today.  *If you need a refill on your cardiac medications before your next appointment, please call your pharmacy*  Lab Work: None ordered.  If you have labs (blood work) drawn today and your tests are completely normal, you will receive your results only by: [place] (if you have MyChart) OR A paper copy in the mail If you have any lab test that is abnormal or we need to change your treatment, we will call you to review the results.  Testing/Procedures: None ordered.  Follow-Up: At [facility], you and your health needs are our priority.  As part of our continuing mission to provide you with exceptional heart care, we have created designated Provider Care Teams.  These Care Teams include your primary Cardiologist (physician) and Advanced Practice Providers (APPs -  Physician Assistants and Nurse Practitioners) who all work together to provide you with the care you need, when you need it.  We recommend signing up for the patient portal called "MyChart".  Sign up information is provided on this After Visit Summary.  MyChart is used to connect with patients for Virtual Visits (Telemedicine).  Patients are able to view lab/test results, encounter notes, upcoming appointments, etc.  Non-urgent messages can be sent to your provider as well.   To learn more about what you can do with MyChart, go to [email or web address].    Your next appointment:   1 year(s)  The format for your next appointment:   In Person  Provider:   Cristopher Peru, MD{or one of the following Advanced Practice Providers on your designated Care Team:   Tommye Standard, [place] Legrand Como "Jonni Sanger" Chalmers Cater, [place]  Remote monitoring is used to monitor your Pacemaker from home. This monitoring reduces the number of office visits required to check your device to  one time per year. It allows [place] to keep an eye on the functioning of your device to ensure it is working properly. You are scheduled for a device check from home on 04/12/22. You may send your transmission at any time that day. If you have a wireless device, the transmission will be sent automatically. After your physician reviews your transmission, you will receive a postcard with your next transmission date.  Important Information About Sugar

## 2022-04-12 ENCOUNTER — Ambulatory Visit (INDEPENDENT_AMBULATORY_CARE_PROVIDER_SITE_OTHER): Payer: Medicare Other

## 2022-04-12 DIAGNOSIS — I442 Atrioventricular block, complete: Secondary | ICD-10-CM

## 2022-04-12 LAB — CUP PACEART REMOTE DEVICE CHECK
Battery Remaining Longevity: 89 mo
Battery Remaining Percentage: 75 %
Battery Voltage: 3.01 V
Brady Statistic AP VP Percent: 42 %
Brady Statistic AP VS Percent: 1 %
Brady Statistic AS VP Percent: 58 %
Brady Statistic AS VS Percent: 1 %
Brady Statistic RA Percent Paced: 42 %
Brady Statistic RV Percent Paced: 99 %
Date Time Interrogation Session: 20231020020016
Implantable Lead Implant Date: 20101229
Implantable Lead Implant Date: 20101229
Implantable Lead Location: 753859
Implantable Lead Location: 753860
Implantable Pulse Generator Implant Date: 20201221
Lead Channel Impedance Value: 480 Ohm
Lead Channel Impedance Value: 540 Ohm
Lead Channel Pacing Threshold Amplitude: 0.75 V
Lead Channel Pacing Threshold Amplitude: 0.75 V
Lead Channel Pacing Threshold Pulse Width: 0.5 ms
Lead Channel Pacing Threshold Pulse Width: 0.5 ms
Lead Channel Sensing Intrinsic Amplitude: 11.4 mV
Lead Channel Sensing Intrinsic Amplitude: 2.5 mV
Lead Channel Setting Pacing Amplitude: 1 V
Lead Channel Setting Pacing Amplitude: 1.75 V
Lead Channel Setting Pacing Pulse Width: 0.5 ms
Lead Channel Setting Sensing Sensitivity: 4 mV
Pulse Gen Model: 2272
Pulse Gen Serial Number: 9190527

## 2022-04-18 NOTE — Progress Notes (Signed)
Remote pacemaker transmission.   

## 2022-05-14 NOTE — Progress Notes (Signed)
[facility] - Clinic Note  05/20/2022     CHIEF COMPLAINT Patient presents for Retina Follow Up   HISTORY OF PRESENT ILLNESS: [name] [name] is a 76 y.o. male who presents to the clinic today for:   HPI     Retina Follow Up   Patient presents with  CRVO/BRVO.  In right eye.  This started 3 months ago.  Severity is mild.  Duration of 3 months.  Since onset it is stable.  I, the attending physician,  performed the HPI with the patient and updated documentation appropriately.        Comments   3 month retina follow up BRVO OD pt states no vision change noticed he denies flashes or floaters       Last edited by [name], MD on 05/20/2022  9:09 AM.    Pt feels like he had a hard time reading the eye chart this morning, he only used drops if his eyes itch   Referring physician: Mateo Flow, MD [address] [place],  [place] [address]  HISTORICAL INFORMATION:   Selected notes from the MEDICAL RECORD NUMBER Referred by [name] for concern of vein occlusion OD LEE:  Ocular Hx- PMH-   CURRENT MEDICATIONS: No current outpatient medications on file. (Ophthalmic Drugs)   No current facility-administered medications for this visit. (Ophthalmic Drugs)   Current Outpatient Medications (Other)  Medication Sig   acetaminophen (TYLENOL) 500 MG tablet Take 500-1,000 mg by mouth every 6 (six) hours as needed for moderate pain or headache.   Cyanocobalamin (B-12 PO) Take 1 capsule by mouth daily.   finasteride (PROSCAR) 5 MG tablet Take 5 mg by mouth daily.   levothyroxine (SYNTHROID, LEVOTHROID) 100 MCG tablet TAKE 1 TABLET BY MOUTH ONCE DAILY   MELATONIN PO Take 1 tablet by mouth at bedtime.   Multiple Vitamin (MULTIVITAMIN WITH MINERALS) TABS tablet Take 1 tablet by mouth daily.   pravastatin (PRAVACHOL) 40 MG tablet TAKE 1 TABLET BY MOUTH EVERY DAY   sertraline (ZOLOFT) 100 MG tablet Take 1.5 tablets (150 mg total) by mouth daily.    sildenafil (VIAGRA) 100 MG tablet Take 50-100 mg by mouth daily as needed.   tamsulosin (FLOMAX) 0.4 MG CAPS capsule Take by mouth.   VITAMIN D PO Take 1 capsule by mouth daily.   No current facility-administered medications for this visit. (Other)   REVIEW OF SYSTEMS:   ALLERGIES Allergies  Allergen Reactions   Egg White (Diagnostic)     Showed up on allergy test   Penicillins     Did it involve swelling of the face/tongue/throat, SOB, or low BP? No Did it involve sudden or severe rash/hives, skin peeling, or any reaction on the inside of your mouth or nose? No Did you need to seek medical attention at a hospital or doctor's office? Unknown When did it last happen?      Childhood allergy If all above answers are "NO", may proceed with cephalosporin use.    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   Cataract    Mixed form OU   Chronic kidney disease 02/21/2022   Depression 02/21/2022   History of skin cancer 04/29/2017  History of skin cancer 04/29/2017   Hyperlipidemia 02/21/2022   Hypertensive retinopathy    OU   Hypothyroidism 04/29/2017   Itching 04/29/2017   Scabies 05/28/2017   Past Surgical History:  Procedure Laterality Date   PPM GENERATOR CHANGEOUT N/A 06/14/2019   Procedure: PPM GENERATOR CHANGEOUT;  Surgeon: Evans Lance, MD;  Location: [facility];  Service: Cardiovascular;  Laterality: N/A;   FAMILY HISTORY Family History  Problem Relation Age of Onset   Hypertension Brother    SOCIAL HISTORY Social History   Tobacco Use   Smoking status: Never    Passive exposure: Never   Smokeless tobacco: Never  Vaping Use   Vaping Use: Never used  Substance Use Topics   Alcohol use: Yes   Drug use: No       OPHTHALMIC  EXAM: Base Eye Exam     Visual Acuity (Snellen - Linear)       Right Left   Dist [place] 20/20 -2 20/30 -1   Dist ph [place]  20/20 -2         Tonometry (Tonopen, 8:35 AM)       Right Left   Pressure 16 16         Pupils       Pupils Dark Light Shape React APD   Right PERRL 3 2 Round Brisk None   Left PERRL 3 2 Round Brisk None         Visual Fields       Left Right    Full Full         Extraocular Movement       Right Left    Full Full         Neuro/Psych     Oriented x3: Yes   Mood/Affect: Normal         Dilation     Both eyes: 2.5% Phenylephrine @ 8:35 AM           Slit Lamp and Fundus Exam     Slit Lamp Exam       Right Left   Lids/Lashes Dermatochalasis - upper lid, Meibomian gland dysfunction, inflammed Hordeolum - lower lid - improving Dermatochalasis - upper lid, mild MGD   Conjunctiva/Sclera White and quiet White and quiet   Cornea trace Punctate epithelial erosions Trace Punctate epithelial erosions   Anterior Chamber Deep and quiet Deep and quiet   Iris Round and dilated Round and dilated   Lens 2-3+ Nuclear sclerosis, 2-3+ Cortical cataract 2-3+ Nuclear sclerosis, 2-3+ Cortical cataract   Anterior Vitreous Vitreous syneresis, Posterior vitreous detachment, vitreous condensations Vitreous syneresis, Posterior vitreous detachment         Fundus Exam       Right Left   Disc Pink and Sharp Pink and sharp, mild PPA, mild, focal PPP temporal   C/D Ratio 0.6 0.6   Macula Flat, good foveal reflex, trace cystic changes/edema IT to fovea -- improved, prominent MA's temporal and inferior to fovea -- stably improved, mild focal laser changes, no heme, mild ERM Flat, good foveal reflex, retinal pigment epithelial mottling, No heme or edema   Vessels attenuated, Tortuous attenuated, tortuous   Periphery Attached, no heme; pigmented operculated hole at 0700 midzone -- no SRF -- good laser surrounding, no new RT/RD Attached, no RT/RD            IMAGING AND PROCEDURES  Imaging and Procedures for @TODAY @  OCT, Retina - OU - Both Eyes  Right Eye Quality was good. Central Foveal Thickness: 328. Progression has improved. Findings include normal foveal contour, no IRF, no SRF (Interval improvement in IRF/cystic changes inferior fovea and macula).   Left Eye Quality was good. Central Foveal Thickness: 328. Progression has been stable. Findings include normal foveal contour, no IRF, no SRF (stable release of partial PVD).   Notes *Images captured and stored on drive  Diagnosis / Impression:  OD: BRVO w/ Interval improvement in IRF/cystic changes inferior fovea and macula OS: NFP, no IRF/SRF, stable release of partial PVD  Clinical management:  See below  Abbreviations: NFP - Normal foveal profile. CME - cystoid macular edema. PED - pigment epithelial detachment. IRF - intraretinal fluid. SRF - subretinal fluid. EZ - ellipsoid zone. ERM - epiretinal membrane. ORA - outer retinal atrophy. ORT - outer retinal tubulation. SRHM - subretinal hyper-reflective material      Intravitreal Injection, Pharmacologic Agent - OD - Right Eye       Time Out 05/20/2022. 8:57 AM. Confirmed correct patient, procedure, site, and patient consented.   Anesthesia Topical anesthesia was used. Anesthetic medications included Lidocaine 2%, Proparacaine 0.5%.   Procedure Preparation included 5% betadine to ocular surface, eyelid speculum. A (32g) needle was used.   Injection: 2 mg aflibercept 2 MG/0.05ML   Route: Intravitreal, Site: Right Eye   NDC: [id number], Lot: [id number], Expiration date: 08/22/2023, Waste: 0 mL   Post-op Post injection exam found visual acuity of at least counting fingers. The patient tolerated the procedure well. There were no complications. The patient received written and verbal post procedure care education. Post injection medications were not given.            ASSESSMENT/PLAN:    ICD-10-CM   1.  Branch retinal vein occlusion of right eye with macular edema  H34.8310 OCT, Retina - OU - Both Eyes    Intravitreal Injection, Pharmacologic Agent - OD - Right Eye    aflibercept (EYLEA) SOLN 2 mg    2. Essential hypertension  I10     3. Hypertensive retinopathy of both eyes  H35.033     4. Retinal hole of right eye  H33.321     5. Posterior vitreous detachment of both eyes  H43.813     6. Combined forms of age-related cataract of both eyes  H25.813       1. BRVO with CME OD  - FA 5.14.21 shows +focal telangectasias with late leakage inferior macula consistent with remote inf BRVO  - S/P IVA OD #1 (05.14.21), #2 (06.14.21), #3 (07.16.21) -- IVA resistance  - s/p IVE OD #1 (08.18.21) -- sample, #2 (09.15.21), #3 (10.18.21), #4 (11.17.21), #5 (12.15.21), #6 (01.14.22), #7 (02.11.22), #8 (03.18.22), #9 (04.15.22), #10 (06.10.22), #11 (07.22.22), #12 (09.09.22), #13 (11.04.22), #14 (12.30.22), #15 (02.24.23), #16 (08.23.23)  - s/p focal laser OD (05.13.22)  **history of recurrent IRF/cystic changes at 6 mos (2.24.23 - 08.23.23)  - BCVA 20/20 OD (stable)  - OCT shows BRVO w/ interval improvement in cystic changes -- 3 months since last injection  - recommend IVE OD #17 today, 11.27.23 for recurrent IRF/cystic changes with follow up in 4 months  - pt wishes to proceed with injection  - RBA of procedure discussed, questions answered - informed consent obtained and signed - see procedure note  - Eylea informed consent form re-signed and scanned on 05.10.2023  - [email or web address] benefits investigation started, 08.18.21 -- approved as of 09.15.21 -- secondary ins pays -- verified for 2023  - discussed possible  need for maintenance medication q3-4 mos  - F/U 4 months -- DFE/OCT  2,3. Hypertensive retinopathy OU  - discussed importance of tight BP control  - monitor   4. Operculated retinal hole OD - pt with long-standing history of PVD OD - pt asymptomatic --- no flashes / floaters   - small,  pigmented operculated hole at 0700 midzone OD - s/p laser retinopexy OD (08.02.23) -- good laser surrounding - f/u in 3 months DFE, OCT  5. PVD OU  - OD: long standing, asymptomatic  - OS: interval release of partial PVD on OCT--noted on 08.02.23, asymptomatic  - Discussed findings and prognosis  - No RT or RD on 360 peripheral exam OS  - Reviewed s/s of RT/RD  - Strict return precautions for any such RT/RD signs/symptoms  - monitor  6. Mixed form cataracts OU  - The symptoms of cataract, surgical options, and treatments and risks were discussed with patient.  - discussed diagnosis and progression  - has been lost to f/u with [facility]  - will refer back to [facility] for cat eval   Ophthalmic Meds Ordered this visit:  Meds ordered this encounter  Medications   aflibercept (EYLEA) SOLN 2 mg     Return in about 4 months (around 09/18/2022) for f/u BRVO OD, DFE, OCT.  There are no Patient Instructions on file for this visit.  This document serves as a record of services personally performed by Gardiner Sleeper, MD, PhD. It was created on their behalf by Roselee Nova, COMT. The creation of this record is the provider's dictation and/or activities during the visit.  Electronically signed by: Roselee Nova, COMT 05/20/22 12:58 PM  This document serves as a record of services personally performed by Gardiner Sleeper, MD, PhD. It was created on their behalf by San Jetty. Owens Shark, OA an ophthalmic technician. The creation of this record is the provider's dictation and/or activities during the visit.    Electronically signed by: San Jetty. Owens Shark, [place] 11.27.2023 12:58 PM  Gardiner Sleeper, M.D., Ph.D. Diseases & Surgery of the Retina and Vitreous [facility]  I have reviewed the above documentation for accuracy and completeness, and I agree with the above. Gardiner Sleeper, M.D., Ph.D. 05/20/22 1:01 PM  Abbreviations: M myopia (nearsighted); A  astigmatism; H hyperopia (farsighted); P presbyopia; Mrx spectacle prescription;  CTL contact lenses; OD right eye; OS left eye; OU both eyes  XT exotropia; ET esotropia; PEK punctate epithelial keratitis; PEE punctate epithelial erosions; DES dry eye syndrome; MGD meibomian gland dysfunction; ATs artificial tears; PFAT's preservative free artificial tears; [place] nuclear sclerotic cataract; PSC posterior subcapsular cataract; ERM epi-retinal membrane; PVD posterior vitreous detachment; RD retinal detachment; DM diabetes mellitus; DR diabetic retinopathy; NPDR non-proliferative diabetic retinopathy; PDR proliferative diabetic retinopathy; CSME clinically significant macular edema; DME diabetic macular edema; dbh dot blot hemorrhages; CWS cotton wool spot; POAG primary open angle glaucoma; C/D cup-to-disc ratio; HVF humphrey visual field; GVF goldmann visual field; OCT optical coherence tomography; IOP intraocular pressure; BRVO Branch retinal vein occlusion; CRVO central retinal vein occlusion; CRAO central retinal artery occlusion; BRAO branch retinal artery occlusion; RT retinal tear; SB scleral buckle; PPV pars plana vitrectomy; VH Vitreous hemorrhage; PRP panretinal laser photocoagulation; IVK intravitreal kenalog; VMT vitreomacular traction; MH Macular hole;  NVD neovascularization of the disc; NVE neovascularization elsewhere; AREDS age related eye disease study; ARMD age related macular degeneration; POAG primary open angle glaucoma; EBMD epithelial/anterior basement membrane dystrophy; ACIOL anterior  chamber intraocular lens; IOL intraocular lens; PCIOL posterior chamber intraocular lens; Phaco/IOL phacoemulsification with intraocular lens placement; [place] photorefractive keratectomy; LASIK laser assisted in situ keratomileusis; HTN hypertension; DM diabetes mellitus; COPD chronic obstructive pulmonary disease

## 2022-05-20 ENCOUNTER — Ambulatory Visit (INDEPENDENT_AMBULATORY_CARE_PROVIDER_SITE_OTHER): Payer: Medicare Other | Admitting: Ophthalmology

## 2022-05-20 ENCOUNTER — Encounter (INDEPENDENT_AMBULATORY_CARE_PROVIDER_SITE_OTHER): Payer: Self-pay | Admitting: Ophthalmology

## 2022-05-20 DIAGNOSIS — H43813 Vitreous degeneration, bilateral: Secondary | ICD-10-CM | POA: Diagnosis not present

## 2022-05-20 DIAGNOSIS — H33321 Round hole, right eye: Secondary | ICD-10-CM

## 2022-05-20 DIAGNOSIS — I10 Essential (primary) hypertension: Secondary | ICD-10-CM | POA: Diagnosis not present

## 2022-05-20 DIAGNOSIS — H34831 Tributary (branch) retinal vein occlusion, right eye, with macular edema: Secondary | ICD-10-CM

## 2022-05-20 DIAGNOSIS — H35033 Hypertensive retinopathy, bilateral: Secondary | ICD-10-CM

## 2022-05-20 DIAGNOSIS — H25813 Combined forms of age-related cataract, bilateral: Secondary | ICD-10-CM

## 2022-05-20 MED ORDER — AFLIBERCEPT 2MG/0.05ML IZ SOLN FOR KALEIDOSCOPE
2.0000 mg | INTRAVITREAL | Status: AC | PRN
  Administered 2022-05-20: 2 mg via INTRAVITREAL

## 2022-07-12 ENCOUNTER — Ambulatory Visit: Payer: Medicare Other | Attending: Internal Medicine

## 2022-07-12 DIAGNOSIS — I442 Atrioventricular block, complete: Secondary | ICD-10-CM

## 2022-07-12 LAB — CUP PACEART REMOTE DEVICE CHECK
Battery Remaining Longevity: 85 mo
Battery Remaining Percentage: 72 %
Battery Voltage: 3.01 V
Brady Statistic AP VP Percent: 44 %
Brady Statistic AP VS Percent: 1 %
Brady Statistic AS VP Percent: 56 %
Brady Statistic AS VS Percent: 1 %
Brady Statistic RA Percent Paced: 43 %
Brady Statistic RV Percent Paced: 99 %
Date Time Interrogation Session: 20240119020016
Implantable Lead Connection Status: 753985
Implantable Lead Connection Status: 753985
Implantable Lead Implant Date: 20101229
Implantable Lead Implant Date: 20101229
Implantable Lead Location: 753859
Implantable Lead Location: 753860
Implantable Pulse Generator Implant Date: 20201221
Lead Channel Impedance Value: 450 Ohm
Lead Channel Impedance Value: 530 Ohm
Lead Channel Pacing Threshold Amplitude: 0.625 V
Lead Channel Pacing Threshold Amplitude: 0.75 V
Lead Channel Pacing Threshold Pulse Width: 0.5 ms
Lead Channel Pacing Threshold Pulse Width: 0.5 ms
Lead Channel Sensing Intrinsic Amplitude: 11.4 mV
Lead Channel Sensing Intrinsic Amplitude: 2.6 mV
Lead Channel Setting Pacing Amplitude: 0.875
Lead Channel Setting Pacing Amplitude: 1.75 V
Lead Channel Setting Pacing Pulse Width: 0.5 ms
Lead Channel Setting Sensing Sensitivity: 4 mV
Pulse Gen Model: 2272
Pulse Gen Serial Number: 9190527

## 2022-07-29 NOTE — Progress Notes (Signed)
Remote pacemaker transmission.   

## 2022-09-13 ENCOUNTER — Encounter (INDEPENDENT_AMBULATORY_CARE_PROVIDER_SITE_OTHER): Payer: Medicare Other | Admitting: Ophthalmology

## 2022-09-25 NOTE — Progress Notes (Signed)
[facility] - Clinic Note  09/27/2022     [name] COMPLAINT Patient presents for Retina Follow Up   HISTORY OF PRESENT ILLNESS: [name] [name] is a 77 y.o. male who presents to the clinic today for:   HPI     Retina Follow Up   Patient presents with  CRVO/BRVO.  In right eye.  This started months ago.  Severity is mild.  Duration of 4 months.  Since onset it is stable.  I, the attending physician,  performed the HPI with the patient and updated documentation appropriately.        Comments   Patient feels that the vision is the same. He is not using any eye drops at this time.       Last edited by [name], [name] on 09/27/2022 12:16 PM.     Pt feels    Referring physician: No referring provider defined for this encounter.  HISTORICAL INFORMATION:   Selected notes from the MEDICAL RECORD NUMBER Referred by [name] for concern of vein occlusion OD [name] [name]:  Ocular Hx- PMH-   CURRENT MEDICATIONS: No current outpatient medications on file. (Ophthalmic Drugs)   No current facility-administered medications for this visit. (Ophthalmic Drugs)   Current Outpatient Medications (Other)  Medication Sig   acetaminophen (TYLENOL) 500 MG tablet Take 500-1,000 mg by mouth every 6 (six) hours as needed for moderate pain or headache.   Cyanocobalamin (B-12 PO) Take 1 capsule by mouth daily.   finasteride (PROSCAR) 5 MG tablet Take 5 mg by mouth daily.   levothyroxine (SYNTHROID, LEVOTHROID) 100 MCG tablet TAKE 1 TABLET BY MOUTH ONCE DAILY   MELATONIN PO Take 1 tablet by mouth at bedtime.   Multiple Vitamin (MULTIVITAMIN WITH MINERALS) TABS tablet Take 1 tablet by mouth daily.   pravastatin (PRAVACHOL) 40 MG tablet TAKE 1 TABLET BY MOUTH EVERY DAY   sertraline (ZOLOFT) 100 MG tablet Take 1.5 tablets (150 mg total) by mouth daily.   sildenafil (VIAGRA) 100 MG tablet Take 50-100 mg by mouth daily as needed.   tamsulosin (FLOMAX) 0.4 MG CAPS capsule Take by  mouth.   VITAMIN D PO Take 1 capsule by mouth daily.   No current facility-administered medications for this visit. (Other)   REVIEW OF SYSTEMS: ROS   Positive for: Genitourinary, Cardiovascular, Eyes Negative for: Constitutional, Gastrointestinal, Neurological, Skin, Musculoskeletal, HENT, Endocrine, Respiratory, Psychiatric, Allergic/Imm, Heme/Lymph Last edited by Julieanne [name], [name] on 09/27/2022  8:03 AM.      ALLERGIES Allergies  Allergen Reactions   Egg White (Diagnostic)     Showed up on allergy test   Penicillins     Did it involve swelling of the face/tongue/throat, SOB, or low BP? No Did it involve sudden or severe rash/hives, skin peeling, or any reaction on the inside of your mouth or nose? No Did you need to seek medical attention at a hospital or doctor's office? Unknown When did it last happen?      Childhood allergy If all above answers are "NO", may proceed with cephalosporin use.    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   Cataract    Mixed form OU   Chronic kidney disease 02/21/2022  Depression 02/21/2022   History of skin cancer 04/29/2017   History of skin cancer 04/29/2017   Hyperlipidemia 02/21/2022   Hypertensive retinopathy    OU   Hypothyroidism 04/29/2017   Itching 04/29/2017   Scabies 05/28/2017   Past Surgical History:  Procedure Laterality Date   PPM GENERATOR CHANGEOUT N/A 06/14/2019   Procedure: PPM GENERATOR CHANGEOUT;  Surgeon: [name], [name];  Location: [facility];  Service: Cardiovascular;  Laterality: N/A;   FAMILY HISTORY Family History  Problem Relation Age of Onset   Hypertension Brother    SOCIAL HISTORY Social History   Tobacco Use   Smoking status: Never    Passive exposure: Never    Smokeless tobacco: Never  Vaping Use   Vaping Use: Never used  Substance Use Topics   Alcohol use: Yes   Drug use: No       OPHTHALMIC EXAM: Base Eye Exam     Visual Acuity (Snellen - Linear)       Right Left   Dist [place] 20/20 20/30 +1   Dist ph [place]  20/20         Tonometry (Tonopen, 8:08 AM)       Right Left   Pressure 18 16         Pupils       Dark Light Shape React APD   Right 3 2 Round Brisk None   Left 3 2 Round Brisk None         Visual Fields       Left Right    Full Full         Extraocular Movement       Right Left    Full, Ortho Full, Ortho         Neuro/Psych     Oriented x3: Yes   Mood/Affect: Normal         Dilation     Both eyes: 1.0% Mydriacyl, 2.5% Phenylephrine @ 8:04 AM           Slit Lamp and Fundus Exam     Slit Lamp Exam       Right Left   Lids/Lashes Dermatochalasis - upper lid Dermatochalasis - upper lid, mild MGD   Conjunctiva/Sclera White and quiet White and quiet   Cornea trace Punctate epithelial erosions Trace Punctate epithelial erosions   Anterior Chamber deep and clear Deep and quiet   Iris Round and dilated Round and dilated   Lens 2-3+ Nuclear sclerosis with mild brunescence, 2-3+ Cortical cataract 2-3+ Nuclear sclerosis with mild brunescence, 2-3+ Cortical cataract   Anterior Vitreous Vitreous syneresis, Posterior vitreous detachment, vitreous condensations Vitreous syneresis, Posterior vitreous detachment         Fundus Exam       Right Left   Disc Pink and Sharp Pink and sharp, mild PPA, mild, focal PPP temporal   C/D Ratio 0.6 0.6   Macula Flat, good foveal reflex, trace cystic changes/edema IT to fovea -- stably improved, prominent MA's temporal and inferior to fovea -- stably improved, mild focal laser changes, no heme, mild ERM Flat, good foveal reflex, retinal pigment epithelial mottling, No heme or edema   Vessels attenuated, Tortuous attenuated, tortuous   Periphery Attached, no  heme; pigmented operculated hole at 0700 midzone -- no SRF -- good laser surrounding, no new RT/RD Attached, no RT/RD           IMAGING AND PROCEDURES  Imaging and Procedures for @  OCT, Retina -  OU - Both Eyes       Right Eye Quality was good. Central Foveal Thickness: 330. Progression has been stable. Findings include normal foveal contour, no IRF, no SRF (stable improvement in IRF/cystic changes inferior fovea and macula).   Left Eye Quality was good. Central Foveal Thickness: 323. Progression has been stable. Findings include normal foveal contour, no IRF, no SRF (stable release of partial PVD).   Notes *Images captured and stored on drive  Diagnosis / Impression:  OD: BRVO w/ stable improvement in IRF/cystic changes inferior fovea and macula OS: NFP, no IRF/SRF, stable release of partial PVD  Clinical management:  See below  Abbreviations: NFP - Normal foveal profile. CME - cystoid macular edema. PED - pigment epithelial detachment. IRF - intraretinal fluid. SRF - subretinal fluid. EZ - ellipsoid zone. ERM - epiretinal membrane. ORA - outer retinal atrophy. ORT - outer retinal tubulation. SRHM - subretinal hyper-reflective material            ASSESSMENT/PLAN:    ICD-10-CM   1. Branch retinal vein occlusion of right eye with macular edema  H34.8310 OCT, Retina - OU - Both Eyes    2. Essential hypertension  I10     3. Hypertensive retinopathy of both eyes  H35.033     4. Retinal hole of right eye  H33.321     5. Posterior vitreous detachment of both eyes  H43.813     6. Combined forms of age-related cataract of both eyes  H25.813      1. BRVO with CME OD  - FA 5.14.21 shows +focal telangectasias with late leakage inferior macula consistent with remote inf BRVO  - S/P IVA OD #1 (05.14.21), #2 (06.14.21), #3 (07.16.21) -- IVA resistance  - s/p IVE OD #1 (08.18.21) -- sample, #2 (09.15.21), #3 (10.18.21), #4 (11.17.21), #5 (12.15.21), #6 (01.14.22), #7  (02.11.22), #8 (03.18.22), #9 (04.15.22), #10 (06.10.22), #11 (07.22.22), #12 (09.09.22), #13 (11.04.22), #14 (12.30.22), #15 (02.24.23), #16 (08.23.23), #17 (11.27.23)  - s/p focal laser OD (05.13.22)  **history of recurrent IRF/cystic changes at 6 mos (2.24.23 - 08.23.23)  - BCVA 20/20 OD (stable)  - OCT shows BRVO w/ stable improvement in cystic changes at 4 months since last injection  - recommend holding IVE today w/ f/u in 4 mos -- will treat prn  - pt in agreement  - [employer] informed consent form re-signed and scanned on 05.10.2023  - [email or web address] benefits investigation started, 08.18.21 -- approved as of 09.15.21 -- secondary ins pays -- verified for 2023  - F/U 4 months, sooner prn -- DFE/OCT  2,3. Hypertensive retinopathy OU  - discussed importance of tight BP control  - monitor   4. Operculated retinal hole OD - pt with long-standing history of PVD OD - pt asymptomatic --- no flashes / floaters   - small, pigmented operculated hole at 0700 midzone OD - s/p laser retinopexy OD (08.02.23) -- good laser surrounding - f/u in 4 months DFE, OCT  5. PVD OU  - OD: long standing, asymptomatic  - OS: interval release of partial PVD on OCT--noted on 08.02.23, asymptomatic  - Discussed findings and prognosis  - No RT or RD on 360 peripheral exam OS  - Reviewed s/s of RT/RD  - Strict return precautions for any such RT/RD signs/symptoms  - monitor  6. Mixed form cataracts OU  - The symptoms of cataract, surgical options, and treatments and risks were discussed with patient.  - discussed diagnosis and progression  - has been  lost to f/u with [facility]  - has appt with Dr. Elmer Picker on Oct 30, 2022   Ophthalmic Meds Ordered this visit:  No orders of the defined types were placed in this encounter.    Return in about 4 months (around 01/27/2023) for BRVO OD, DFE, OCT. This document serves as a record of services personally performed by Karie Chimera, [name], [name]. It was created on  their behalf by Berlin Hun [name], an ophthalmic technician. The creation of this record is the provider's dictation and/or activities during the visit.    Electronically signed by: Berlin Hun [name] 09/25/2022 12:16 PM  This document serves as a record of services personally performed by Karie Chimera, [name], [name]. It was created on their behalf by Glee Arvin. Manson [name], OA an ophthalmic technician. The creation of this record is the provider's dictation and/or activities during the visit.    Electronically signed by: Glee Arvin. Manson [name], [place] 04.05.2024 12:16 PM  Karie [name], M.D., Ph.D. Diseases & Surgery of the Retina and Vitreous [facility] 09/27/2022    I have reviewed the above documentation for accuracy and completeness, and I agree with the above. Karie [name], M.D., Ph.D. 09/27/22 12:19 PM   Abbreviations: M myopia (nearsighted); A astigmatism; H hyperopia (farsighted); P presbyopia; Mrx spectacle prescription;  CTL contact lenses; OD right eye; OS left eye; OU both eyes  XT exotropia; ET esotropia; PEK punctate epithelial keratitis; PEE punctate epithelial erosions; DES dry eye syndrome; MGD meibomian gland dysfunction; ATs artificial tears; PFAT's preservative free artificial tears; NSC nuclear sclerotic cataract; PSC posterior subcapsular cataract; ERM epi-retinal membrane; PVD posterior vitreous detachment; RD retinal detachment; DM diabetes mellitus; DR diabetic retinopathy; NPDR non-proliferative diabetic retinopathy; PDR proliferative diabetic retinopathy; CSME clinically significant macular edema; DME diabetic macular edema; dbh dot blot hemorrhages; CWS [name] wool spot; POAG primary open angle glaucoma; C/D cup-to-disc ratio; HVF humphrey visual field; GVF goldmann visual field; OCT optical coherence tomography; IOP intraocular pressure; BRVO Branch retinal vein occlusion; CRVO central retinal vein occlusion; CRAO central retinal artery occlusion;  BRAO branch retinal artery occlusion; RT retinal tear; SB scleral buckle; PPV pars plana vitrectomy; VH Vitreous hemorrhage; PRP panretinal laser photocoagulation; IVK intravitreal kenalog; VMT vitreomacular traction; MH Macular hole;  NVD neovascularization of the disc; NVE neovascularization elsewhere; AREDS age related eye disease study; ARMD age related macular degeneration; POAG primary open angle glaucoma; EBMD epithelial/anterior basement membrane dystrophy; ACIOL anterior chamber intraocular lens; IOL intraocular lens; PCIOL posterior chamber intraocular lens; Phaco/IOL phacoemulsification with intraocular lens placement; PRK photorefractive keratectomy; LASIK laser assisted in situ keratomileusis; HTN hypertension; DM diabetes mellitus; COPD chronic obstructive pulmonary disease

## 2022-09-27 ENCOUNTER — Ambulatory Visit (INDEPENDENT_AMBULATORY_CARE_PROVIDER_SITE_OTHER): Payer: Medicare Other | Admitting: Ophthalmology

## 2022-09-27 ENCOUNTER — Encounter (INDEPENDENT_AMBULATORY_CARE_PROVIDER_SITE_OTHER): Payer: Self-pay | Admitting: Ophthalmology

## 2022-09-27 DIAGNOSIS — H35033 Hypertensive retinopathy, bilateral: Secondary | ICD-10-CM

## 2022-09-27 DIAGNOSIS — H33321 Round hole, right eye: Secondary | ICD-10-CM | POA: Diagnosis not present

## 2022-09-27 DIAGNOSIS — I10 Essential (primary) hypertension: Secondary | ICD-10-CM | POA: Diagnosis not present

## 2022-09-27 DIAGNOSIS — H34831 Tributary (branch) retinal vein occlusion, right eye, with macular edema: Secondary | ICD-10-CM | POA: Diagnosis not present

## 2022-09-27 DIAGNOSIS — H25813 Combined forms of age-related cataract, bilateral: Secondary | ICD-10-CM

## 2022-09-27 DIAGNOSIS — H43813 Vitreous degeneration, bilateral: Secondary | ICD-10-CM

## 2022-10-11 ENCOUNTER — Ambulatory Visit (INDEPENDENT_AMBULATORY_CARE_PROVIDER_SITE_OTHER): Payer: Medicare Other

## 2022-10-11 DIAGNOSIS — I442 Atrioventricular block, complete: Secondary | ICD-10-CM

## 2022-10-15 LAB — CUP PACEART REMOTE DEVICE CHECK
Battery Remaining Longevity: 82 mo
Battery Remaining Percentage: 69 %
Battery Voltage: 3.01 V
Brady Statistic AP VP Percent: 48 %
Brady Statistic AP VS Percent: 1 %
Brady Statistic AS VP Percent: 52 %
Brady Statistic AS VS Percent: 1 %
Brady Statistic RA Percent Paced: 48 %
Brady Statistic RV Percent Paced: 99 %
Date Time Interrogation Session: 20240422215708
Implantable Lead Connection Status: 753985
Implantable Lead Connection Status: 753985
Implantable Lead Implant Date: 20101229
Implantable Lead Implant Date: 20101229
Implantable Lead Location: 753859
Implantable Lead Location: 753860
Implantable Pulse Generator Implant Date: 20201221
Lead Channel Impedance Value: 450 Ohm
Lead Channel Impedance Value: 530 Ohm
Lead Channel Pacing Threshold Amplitude: 0.5 V
Lead Channel Pacing Threshold Amplitude: 0.75 V
Lead Channel Pacing Threshold Pulse Width: 0.5 ms
Lead Channel Pacing Threshold Pulse Width: 0.5 ms
Lead Channel Sensing Intrinsic Amplitude: 11.4 mV
Lead Channel Sensing Intrinsic Amplitude: 2.6 mV
Lead Channel Setting Pacing Amplitude: 0.75 V
Lead Channel Setting Pacing Amplitude: 1.75 V
Lead Channel Setting Pacing Pulse Width: 0.5 ms
Lead Channel Setting Sensing Sensitivity: 4 mV
Pulse Gen Model: 2272
Pulse Gen Serial Number: 9190527

## 2022-11-08 NOTE — Progress Notes (Signed)
Remote pacemaker transmission.   

## 2022-12-31 ENCOUNTER — Encounter: Payer: Self-pay | Admitting: Geriatric Medicine

## 2022-12-31 LAB — LAB REPORT - SCANNED
Albumin, Urine POC: 0.7
Creatinine, POC: 126 mg/dL
EGFR: 62
Microalb Creat Ratio: 6

## 2023-01-10 ENCOUNTER — Ambulatory Visit: Payer: Medicare Other

## 2023-01-20 ENCOUNTER — Ambulatory Visit: Payer: Medicare Other

## 2023-01-20 DIAGNOSIS — I442 Atrioventricular block, complete: Secondary | ICD-10-CM

## 2023-01-21 LAB — CUP PACEART REMOTE DEVICE CHECK
Battery Remaining Longevity: 79 mo
Battery Remaining Percentage: 67 %
Battery Voltage: 3.01 V
Brady Statistic AP VP Percent: 50 %
Brady Statistic AP VS Percent: 1 %
Brady Statistic AS VP Percent: 50 %
Brady Statistic AS VS Percent: 1 %
Brady Statistic RA Percent Paced: 50 %
Brady Statistic RV Percent Paced: 99 %
Date Time Interrogation Session: 20240727123524
Implantable Lead Connection Status: 753985
Implantable Lead Connection Status: 753985
Implantable Lead Implant Date: 20101229
Implantable Lead Implant Date: 20101229
Implantable Lead Location: 753859
Implantable Lead Location: 753860
Implantable Pulse Generator Implant Date: 20201221
Lead Channel Impedance Value: 460 Ohm
Lead Channel Impedance Value: 530 Ohm
Lead Channel Pacing Threshold Amplitude: 0.625 V
Lead Channel Pacing Threshold Amplitude: 0.75 V
Lead Channel Pacing Threshold Pulse Width: 0.5 ms
Lead Channel Pacing Threshold Pulse Width: 0.5 ms
Lead Channel Sensing Intrinsic Amplitude: 12 mV
Lead Channel Sensing Intrinsic Amplitude: 2 mV
Lead Channel Setting Pacing Amplitude: 0.875
Lead Channel Setting Pacing Amplitude: 1.75 V
Lead Channel Setting Pacing Pulse Width: 0.5 ms
Lead Channel Setting Sensing Sensitivity: 4 mV
Pulse Gen Model: 2272
Pulse Gen Serial Number: 9190527

## 2023-01-31 ENCOUNTER — Encounter (INDEPENDENT_AMBULATORY_CARE_PROVIDER_SITE_OTHER): Payer: Medicare Other | Admitting: Ophthalmology

## 2023-02-05 NOTE — Progress Notes (Signed)
Remote pacemaker transmission.   

## 2023-02-13 NOTE — Progress Notes (Signed)
[facility] - Clinic Note  02/14/2023     CHIEF COMPLAINT Patient presents for Retina Follow Up   HISTORY OF PRESENT ILLNESS: [name] [name] is a 77 y.o. male who presents to the clinic today for:   HPI     Retina Follow Up   Patient presents with  CRVO/BRVO.  In right eye.  Severity is moderate.  Duration of 4 months.  Since onset it is stable.  I, the attending physician,  performed the HPI with the patient and updated documentation appropriately.        Comments   Pt here for 4 mo ret f/u BRVO OD. Pt states VA is the same, no changes.       Last edited by [name], MD on 02/14/2023  8:02 PM.    Pt feels    Referring physician: No referring provider defined for this encounter.  HISTORICAL INFORMATION:   Selected notes from the MEDICAL RECORD NUMBER Referred by [name] for concern of vein occlusion OD LEE:  Ocular Hx- PMH-   CURRENT MEDICATIONS: No current outpatient medications on file. (Ophthalmic Drugs)   No current facility-administered medications for this visit. (Ophthalmic Drugs)   Current Outpatient Medications (Other)  Medication Sig   acetaminophen (TYLENOL) 500 MG tablet Take 500-1,000 mg by mouth every 6 (six) hours as needed for moderate pain or headache.   Cyanocobalamin (B-12 PO) Take 1 capsule by mouth daily.   finasteride (PROSCAR) 5 MG tablet Take 5 mg by mouth daily.   levothyroxine (SYNTHROID, LEVOTHROID) 100 MCG tablet TAKE 1 TABLET BY MOUTH ONCE DAILY   MELATONIN PO Take 1 tablet by mouth at bedtime.   Multiple Vitamin (MULTIVITAMIN WITH MINERALS) TABS tablet Take 1 tablet by mouth daily.   pravastatin (PRAVACHOL) 40 MG tablet TAKE 1 TABLET BY MOUTH EVERY DAY   sertraline (ZOLOFT) 100 MG tablet Take 1.5 tablets (150 mg total) by mouth daily.   sildenafil (VIAGRA) 100 MG tablet Take 50-100 mg by mouth daily as needed.   tamsulosin (FLOMAX) 0.4 MG CAPS capsule Take by mouth.   VITAMIN D PO Take 1  capsule by mouth daily.   No current facility-administered medications for this visit. (Other)   REVIEW OF SYSTEMS: ROS   Positive for: Genitourinary, Cardiovascular, Eyes Negative for: Constitutional, Gastrointestinal, Neurological, Skin, Musculoskeletal, HENT, Endocrine, Respiratory, Psychiatric, Allergic/Imm, Heme/Lymph Last edited by Thompson Grayer, [name] on 02/14/2023  8:08 AM.     ALLERGIES Allergies  Allergen Reactions   Egg White (Diagnostic)     Showed up on allergy test   Penicillins     Did it involve swelling of the face/tongue/throat, SOB, or low BP? No Did it involve sudden or severe rash/hives, skin peeling, or any reaction on the inside of your mouth or nose? No Did you need to seek medical attention at a hospital or doctor's office? Unknown When did it last happen?      Childhood allergy If all above answers are "NO", may proceed with cephalosporin use.    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   Cataract    Mixed form OU   Chronic kidney disease 02/21/2022   Depression 02/21/2022   History  of skin cancer 04/29/2017   History of skin cancer 04/29/2017   Hyperlipidemia 02/21/2022   Hypertensive retinopathy    OU   Hypothyroidism 04/29/2017   Itching 04/29/2017   Scabies 05/28/2017   Past Surgical History:  Procedure Laterality Date   PPM GENERATOR CHANGEOUT N/A 06/14/2019   Procedure: PPM GENERATOR CHANGEOUT;  Surgeon: [name], MD;  [facility]: [facility];  Service: Cardiovascular;  Laterality: N/A;   FAMILY HISTORY Family History  Problem Relation Age of Onset   Hypertension Brother    SOCIAL HISTORY Social History   Tobacco Use   Smoking status: Never    Passive exposure: Never   Smokeless tobacco: Never   Vaping Use   Vaping status: Never Used  Substance Use Topics   Alcohol use: Yes   Drug use: No       OPHTHALMIC EXAM: Base Eye Exam     Visual Acuity (Snellen - Linear)       Right Left   Dist [place] 20/20 -1 20/30 -1   Dist ph [place]  20/20 -1         Tonometry (Tonopen, 8:13 AM)       Right Left   Pressure 14 11         Pupils       Pupils Dark Light Shape React APD   Right PERRL 3 2 Round Brisk None   Left PERRL 3 2 Round Brisk None         Visual Fields (Counting fingers)       Left Right    Full Full         Extraocular Movement       Right Left    Full, Ortho Full, Ortho         Neuro/Psych     Oriented x3: Yes   Mood/Affect: Normal         Dilation     Both eyes: 1.0% Mydriacyl, 2.5% Phenylephrine @ 8:14 AM           Slit Lamp and Fundus Exam     Slit Lamp Exam       Right Left   Lids/Lashes Dermatochalasis - upper lid Dermatochalasis - upper lid, mild MGD   Conjunctiva/Sclera White and quiet White and quiet   Cornea trace Punctate epithelial erosions Trace Punctate epithelial erosions   Anterior Chamber deep and clear Deep and quiet   Iris Round and dilated Round and dilated   Lens 2-3+ Nuclear sclerosis with mild brunescence, 2-3+ Cortical cataract 2-3+ Nuclear sclerosis with mild brunescence, 2-3+ Cortical cataract   Anterior Vitreous Vitreous syneresis, Posterior vitreous detachment, vitreous condensations Vitreous syneresis, Posterior vitreous detachment         Fundus Exam       Right Left   Disc Pink and Sharp Pink and sharp, mild PPA, mild, focal PPP temporal   C/D Ratio 0.6 0.6   Macula Flat, good foveal reflex, trace cystic changes/edema IT to fovea -- stably improved, prominent MA's temporal and inferior to fovea -- stably improved, mild focal laser changes, no heme, mild ERM Flat, good foveal reflex, retinal pigment epithelial mottling, No heme or edema   Vessels attenuated, Tortuous attenuated, tortuous    Periphery Attached, no heme; pigmented operculated hole at 0700 midzone -- no SRF -- good laser surrounding, no new RT/RD Attached, no RT/RD           IMAGING AND PROCEDURES  Imaging and Procedures for @TODAY @  OCT,  Retina - OU - Both Eyes       Right Eye Quality was good. Central Foveal Thickness: 331. Progression has been stable. Findings include normal foveal contour, no IRF, no SRF (stable improvement in IRF/cystic changes inferior fovea and macula).   Left Eye Quality was good. Central Foveal Thickness: 325. Progression has been stable. Findings include normal foveal contour, no IRF, no SRF (stable release of partial PVD).   Notes *Images captured and stored on drive  Diagnosis / Impression:  OD: BRVO w/ stable improvement in IRF/cystic changes inferior fovea and macula OS: NFP, no IRF/SRF, stable release of partial PVD  Clinical management:  See below  Abbreviations: NFP - Normal foveal profile. CME - cystoid macular edema. PED - pigment epithelial detachment. IRF - intraretinal fluid. SRF - subretinal fluid. EZ - ellipsoid zone. ERM - epiretinal membrane. ORA - outer retinal atrophy. ORT - outer retinal tubulation. SRHM - subretinal hyper-reflective material             ASSESSMENT/PLAN:    ICD-10-CM   1. Branch retinal vein occlusion of right eye with macular edema  H34.8310 OCT, Retina - OU - Both Eyes    2. Essential hypertension  I10     3. Hypertensive retinopathy of both eyes  H35.033     4. Retinal hole of right eye  H33.321     5. Posterior vitreous detachment of both eyes  H43.813     6. Combined forms of age-related cataract of both eyes  H25.813       1. BRVO with CME OD  - FA 5.14.21 shows +focal telangectasias with late leakage inferior macula consistent with remote inf BRVO  - S/P IVA OD #1 (05.14.21), #2 (06.14.21), #3 (07.16.21) -- IVA resistance  - s/p IVE OD #1 (08.18.21) -- sample, #2 (09.15.21), #3 (10.18.21), #4 (11.17.21), #5  (12.15.21), #6 (01.14.22), #7 (02.11.22), #8 (03.18.22), #9 (04.15.22), #10 (06.10.22), #11 (07.22.22), #12 (09.09.22), #13 (11.04.22), #14 (12.30.22), #15 (02.24.23), #16 (08.23.23), #17 (11.27.23)  - s/p focal laser OD (05.13.22)  **history of recurrent IRF/cystic changes at 6 mos (2.24.23 - 08.23.23)  - BCVA 20/20 OD (stable)  - OCT shows BRVO w/ stable improvement in cystic changes at 8+ months since last injection  - recommend holding IVE today w/ f/u in 6-9 mos -- will treat prn  - pt in agreement  - [employer] informed consent form re-signed and scanned on 05.10.2023  - [email or web address] benefit verified for 2024  - F/U 6-9 months, sooner prn -- DFE/OCT  2,3. Hypertensive retinopathy OU  - discussed importance of tight BP control  - monitor   4. 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 PVD OU  - OD: long standing, asymptomatic  - OS: interval release of partial PVD on OCT--noted on 08.02.23, asymptomatic  - Discussed findings and prognosis  - No RT or RD on 360 peripheral exam OS  - Reviewed s/s of RT/RD  - Strict return precautions for any such RT/RD signs/symptoms  - monitor  6. Mixed form cataracts OU  - The symptoms of cataract, surgical options, and treatments and risks were discussed with patient.  - discussed diagnosis and progression  - under the expert management of [facility]  - pt had  appt with Dr. Elmer Picker, but has decided to hold off on surgery for now  Ophthalmic Meds Ordered this visit:  No orders of the defined types were placed in this encounter.    Return for f/u 6-9 months- BRVO OD, DFE, OCT.  This document serves as a record of services personally performed by Karie Chimera, MD, PhD. It was created on their behalf by Berlin Hun [name], an ophthalmic technician. The creation of this record is the provider's dictation and/or activities during the visit.    Electronically signed by: Berlin Hun [name] TODAY 8.22.24 8:02 PM  This document serves as a record of services personally performed by Karie Chimera, MD, PhD. It was created on their behalf by Glee Arvin. Manson Passey, OA an ophthalmic technician. The creation of this record is the provider's dictation and/or activities during the visit.    Electronically signed by: Glee Arvin. Manson Passey, OA 02/14/23 8:02 PM  Karie Chimera, M.D., Ph.D. Diseases & Surgery of the Retina and Vitreous [facility] 02/14/2023   I have reviewed the above documentation for accuracy and completeness, and I agree with the above. Karie Chimera, M.D., Ph.D. 02/14/23 8:06 PM  Abbreviations: M myopia (nearsighted); A astigmatism; H hyperopia (farsighted); P presbyopia; Mrx spectacle prescription;  CTL contact lenses; OD right eye; OS left eye; OU both eyes  XT exotropia; ET esotropia; PEK punctate epithelial keratitis; PEE punctate epithelial erosions; DES dry eye syndrome; MGD meibomian gland dysfunction; ATs artificial tears; PFAT's preservative free artificial tears; NSC nuclear sclerotic cataract; PSC posterior subcapsular cataract; ERM epi-retinal membrane; PVD posterior vitreous detachment; RD retinal detachment; DM diabetes mellitus; DR diabetic retinopathy; NPDR non-proliferative diabetic retinopathy; PDR proliferative diabetic retinopathy; CSME clinically significant macular edema; DME diabetic macular edema; dbh dot blot hemorrhages; CWS cotton wool spot; POAG primary open angle glaucoma; C/D cup-to-disc ratio; HVF humphrey visual field; GVF goldmann visual field; OCT optical coherence tomography; IOP intraocular pressure; BRVO Branch retinal vein occlusion; CRVO central retinal vein occlusion; CRAO central retinal artery occlusion; BRAO branch retinal artery  occlusion; RT retinal tear; SB scleral buckle; PPV pars plana vitrectomy; VH Vitreous hemorrhage; PRP panretinal laser photocoagulation; IVK intravitreal kenalog; VMT vitreomacular traction; MH Macular hole;  NVD neovascularization of the disc; NVE neovascularization elsewhere; AREDS age related eye disease study; ARMD age related macular degeneration; POAG primary open angle glaucoma; EBMD epithelial/anterior basement membrane dystrophy; ACIOL anterior chamber intraocular lens; IOL intraocular lens; PCIOL posterior chamber intraocular lens; Phaco/IOL phacoemulsification with intraocular lens placement; PRK photorefractive keratectomy; LASIK laser assisted in situ keratomileusis; HTN hypertension; DM diabetes mellitus; COPD chronic obstructive pulmonary disease

## 2023-02-14 ENCOUNTER — Encounter (INDEPENDENT_AMBULATORY_CARE_PROVIDER_SITE_OTHER): Payer: Self-pay | Admitting: Ophthalmology

## 2023-02-14 ENCOUNTER — Ambulatory Visit (INDEPENDENT_AMBULATORY_CARE_PROVIDER_SITE_OTHER): Payer: Medicare Other | Admitting: Ophthalmology

## 2023-02-14 DIAGNOSIS — H35033 Hypertensive retinopathy, bilateral: Secondary | ICD-10-CM | POA: Diagnosis not present

## 2023-02-14 DIAGNOSIS — I10 Essential (primary) hypertension: Secondary | ICD-10-CM

## 2023-02-14 DIAGNOSIS — H34831 Tributary (branch) retinal vein occlusion, right eye, with macular edema: Secondary | ICD-10-CM | POA: Diagnosis not present

## 2023-02-14 DIAGNOSIS — H33321 Round hole, right eye: Secondary | ICD-10-CM | POA: Diagnosis not present

## 2023-02-14 DIAGNOSIS — H43813 Vitreous degeneration, bilateral: Secondary | ICD-10-CM

## 2023-02-14 DIAGNOSIS — H25813 Combined forms of age-related cataract, bilateral: Secondary | ICD-10-CM

## 2023-04-11 ENCOUNTER — Ambulatory Visit: Payer: Medicare Other

## 2023-04-13 NOTE — Progress Notes (Unsigned)
Cardiology Office Note Date:  04/13/2023  Patient ID:  [name], [name] 12/07/1945, MRN [id number] PCP:  Margot Ables, MD (Inactive)  Electrophysiologist:  Dr. Ladona Ridgel    Chief Complaint: *** annual visit  History of Present Illness: [name] [name] is a 77 y.o. male with history of hypothyroidism, HLD, CHB w/PPM.  He saw Dr. Ladona Ridgel 03/29/22, active and doing well, no changes were made  *** lipids, labs...  Device information: SJM dual chamber PPM implanted 06/21/2009 by Dr. 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   Cataract    Mixed form OU   Chronic kidney disease 02/21/2022   Depression 02/21/2022   History of skin cancer 04/29/2017   History of skin cancer 04/29/2017   Hyperlipidemia 02/21/2022   Hypertensive retinopathy    OU   Hypothyroidism 04/29/2017   Itching 04/29/2017   Scabies 05/28/2017    Past Surgical History:  Procedure Laterality Date   PPM GENERATOR CHANGEOUT N/A 06/14/2019   Procedure: PPM GENERATOR CHANGEOUT;  Surgeon: [name], MD;  Location: [facility];  Service: Cardiovascular;  Laterality: N/A;    Current Outpatient Medications  Medication Sig Dispense Refill   acetaminophen (TYLENOL) 500 MG tablet Take 500-1,000 mg by mouth every 6 (six) hours as needed for moderate pain or headache.     Cyanocobalamin (B-12 PO) Take 1 capsule by mouth daily.     finasteride (PROSCAR) 5 MG tablet Take 5 mg by mouth daily.     levothyroxine (SYNTHROID, LEVOTHROID) 100 MCG tablet TAKE 1 TABLET BY MOUTH ONCE DAILY 90 tablet 0   MELATONIN PO Take 1 tablet by mouth at bedtime.     Multiple Vitamin (MULTIVITAMIN WITH MINERALS) TABS tablet Take 1 tablet by mouth daily.     pravastatin (PRAVACHOL) 40 MG tablet TAKE 1 TABLET BY MOUTH EVERY DAY 90 tablet 3   sertraline (ZOLOFT) 100 MG tablet Take 1.5 tablets  (150 mg total) by mouth daily. 135 tablet 0   sildenafil (VIAGRA) 100 MG tablet Take 50-100 mg by mouth daily as needed.     tamsulosin (FLOMAX) 0.4 MG CAPS capsule Take by mouth.     VITAMIN D PO Take 1 capsule by mouth daily.     No current facility-administered medications for this visit.    Allergies:   Egg white (diagnostic), Penicillins, Shrimp [shellfish allergy], Wheat, and Sulfa antibiotics   Social History:  The patient  reports that he has never smoked. He has never been exposed to tobacco smoke. He has never used smokeless tobacco. He reports current alcohol use. He reports that he does not use drugs.   Family History:  The patient's family history includes Hypertension in his brother.  ROS:  Please see the history of present illness.  All other systems are reviewed and otherwise negative.   PHYSICAL EXAM:  VS:  There were no vitals taken for this visit. BMI: There is no height or weight on file to calculate BMI. Well nourished, well developed, in no acute distress  HEENT: normocephalic, atraumatic  Neck: no JVD, carotid bruits or masses Cardiac: *** RRR; no significant murmurs, no rubs, or gallops Lungs: *** CTA b/l, no wheezing, rhonchi or rales  Abd: soft, nontender MS: no deformity or atrophy Ext: *** no edema  Skin: warm and dry, no rash Neuro: *** He speaks with a  stutter (all his life he tells me), otherwise no gross deficits appreciated Psych: euthymic mood, full affect  *** PPM site is stable, no tethering or discomfort   EKG:  Done today and reviewed by myself  ***  PPM interrogation done today and reviewed by myself:  ***  Recent Labs: No results found for requested labs within last 365 days.  No results found for requested labs within last 365 days.   CrCl cannot be calculated (Patient's most recent lab result is older than the maximum 21 days allowed.).   Wt Readings from Last 3 Encounters:  03/29/22 206 lb (93.4 kg)  03/06/22 205 lb (93 kg)   02/28/22 205 lb (93 kg)     Other studies reviewed: Additional studies/records reviewed today include: summarized above  ASSESSMENT AND PLAN:  1. PPM     ***    Disposition: ***  Current medicines are reviewed at length with the patient today.  The patient did not have any concerns regarding medicines.  [name], PA-C 04/13/2023 9:12 AM     [facility] [address] [place] [address] [phone] (office)  [phone] (fax)

## 2023-04-14 ENCOUNTER — Ambulatory Visit: Payer: Medicare Other | Attending: Physician Assistant | Admitting: Physician Assistant

## 2023-04-14 ENCOUNTER — Encounter: Payer: Self-pay | Admitting: Physician Assistant

## 2023-04-14 VITALS — BP 116/70 | HR 65 | Ht 74.0 in | Wt 206.0 lb

## 2023-04-14 DIAGNOSIS — Z950 Presence of cardiac pacemaker: Secondary | ICD-10-CM | POA: Insufficient documentation

## 2023-04-14 LAB — CUP PACEART INCLINIC DEVICE CHECK
Battery Remaining Longevity: 75 mo
Battery Voltage: 3.01 V
Brady Statistic RA Percent Paced: 51 %
Brady Statistic RV Percent Paced: 99.89 %
Date Time Interrogation Session: 20241021102443
Implantable Lead Connection Status: 753985
Implantable Lead Connection Status: 753985
Implantable Lead Implant Date: 20101229
Implantable Lead Implant Date: 20101229
Implantable Lead Location: 753859
Implantable Lead Location: 753860
Implantable Pulse Generator Implant Date: 20201221
Lead Channel Impedance Value: 475 Ohm
Lead Channel Impedance Value: 525 Ohm
Lead Channel Pacing Threshold Amplitude: 0.625 V
Lead Channel Pacing Threshold Amplitude: 0.875 V
Lead Channel Pacing Threshold Pulse Width: 0.5 ms
Lead Channel Pacing Threshold Pulse Width: 0.5 ms
Lead Channel Sensing Intrinsic Amplitude: 12 mV
Lead Channel Sensing Intrinsic Amplitude: 2.1 mV
Lead Channel Setting Pacing Amplitude: 0.875
Lead Channel Setting Pacing Amplitude: 1.875
Lead Channel Setting Pacing Pulse Width: 0.5 ms
Lead Channel Setting Sensing Sensitivity: 4 mV
Pulse Gen Model: 2272
Pulse Gen Serial Number: 9190527

## 2023-04-14 NOTE — Patient Instructions (Signed)
Medication Instructions:   Your physician recommends that you continue on your current medications as directed. Please refer to the Current Medication list given to you today.   *If you need a refill on your cardiac medications before your next appointment, please call your pharmacy*   Lab Work:  None ordered.  If you have labs (blood work) drawn today and your tests are completely normal, you will receive your results only by: MyChart Message (if you have MyChart) OR A paper copy in the mail If you have any lab test that is abnormal or we need to change your treatment, we will call you to review the results.   Testing/Procedures:  None ordered.   Follow-Up: At [facility], you and your health needs are our priority.  As part of our continuing mission to provide you with exceptional heart care, we have created designated Provider Care Teams.  These Care Teams include your primary Cardiologist (physician) and Advanced Practice Providers (APPs -  Physician Assistants and Nurse Practitioners) who all work together to provide you with the care you need, when you need it.  We recommend signing up for the patient portal called "MyChart".  Sign up information is provided on this After Visit Summary.  MyChart is used to connect with patients for Virtual Visits (Telemedicine).  Patients are able to view lab/test results, encounter notes, upcoming appointments, etc.  Non-urgent messages can be sent to your provider as well.   To learn more about what you can do with MyChart, go to [email or web address].    Your next appointment:   1 year(s)  Provider:   Sherryl Manges, MD or [place]    Other Instructions  Your physician wants you to follow-up in: 1 year.  You will receive a reminder letter in the mail two months in advance. If you don't receive a letter, please call our office to schedule the follow-up appointment.

## 2023-04-21 ENCOUNTER — Ambulatory Visit (INDEPENDENT_AMBULATORY_CARE_PROVIDER_SITE_OTHER): Payer: Medicare Other

## 2023-04-21 DIAGNOSIS — I442 Atrioventricular block, complete: Secondary | ICD-10-CM

## 2023-05-02 ENCOUNTER — Telehealth: Payer: Self-pay | Admitting: Internal Medicine

## 2023-05-02 NOTE — Telephone Encounter (Signed)
Returned call to [employer] regarding mutual patient.  Morrie Sheldon referred to last OV note from 04/14/2023 with Francis Dowse, PA-C and pointed out this particular section: PPM interrogation done today and reviewed by myself:  Battery and lead measurements are good He has had rare very short PATs, PAFlutter Longest 12 seconds back in April  Morrie Sheldon states patient has a CHADSVASC of 3, and given the information above from last office visit note is concerned about patient not being prescribed an anticoagulant or rhythm control medication. She is requesting clarification on the reason for this.  Will forward to Francis Dowse, PA-C and Dr. Ladona Ridgel to review.  Morrie Sheldon states, when calling back, call [phone] and ask to speak with Judd Gaudier from the [place] office.

## 2023-05-02 NOTE — Telephone Encounter (Signed)
[name] [name] from pcp office wanting to know recommendations for this pt from the most recent OV. Please advise

## 2023-05-08 NOTE — Telephone Encounter (Signed)
The most recent heart monitor does not show enough atrial fib to justify the risk of bleeding from  a blood thinner. I recommend watchful waiting.

## 2023-05-09 NOTE — Telephone Encounter (Signed)
Morrie Sheldon, [place] is returning call and states that mid-afternoon is open for return calls after 1:30 P.M.

## 2023-05-09 NOTE — Progress Notes (Signed)
Remote pacemaker transmission.   

## 2023-07-11 ENCOUNTER — Ambulatory Visit: Payer: Medicare Other

## 2023-07-21 ENCOUNTER — Ambulatory Visit (INDEPENDENT_AMBULATORY_CARE_PROVIDER_SITE_OTHER): Payer: Medicare Other

## 2023-07-21 DIAGNOSIS — I442 Atrioventricular block, complete: Secondary | ICD-10-CM

## 2023-07-21 LAB — CUP PACEART REMOTE DEVICE CHECK
Battery Remaining Longevity: 73 mo
Battery Remaining Percentage: 62 %
Battery Voltage: 3.01 V
Brady Statistic AP VP Percent: 54 %
Brady Statistic AP VS Percent: 1 %
Brady Statistic AS VP Percent: 46 %
Brady Statistic AS VS Percent: 1 %
Brady Statistic RA Percent Paced: 54 %
Brady Statistic RV Percent Paced: 99 %
Date Time Interrogation Session: 20250127020016
Implantable Lead Connection Status: 753985
Implantable Lead Connection Status: 753985
Implantable Lead Implant Date: 20101229
Implantable Lead Implant Date: 20101229
Implantable Lead Location: 753859
Implantable Lead Location: 753860
Implantable Pulse Generator Implant Date: 20201221
Lead Channel Impedance Value: 460 Ohm
Lead Channel Impedance Value: 510 Ohm
Lead Channel Pacing Threshold Amplitude: 0.625 V
Lead Channel Pacing Threshold Amplitude: 0.75 V
Lead Channel Pacing Threshold Pulse Width: 0.5 ms
Lead Channel Pacing Threshold Pulse Width: 0.5 ms
Lead Channel Sensing Intrinsic Amplitude: 12 mV
Lead Channel Sensing Intrinsic Amplitude: 2.9 mV
Lead Channel Setting Pacing Amplitude: 0.875
Lead Channel Setting Pacing Amplitude: 1.75 V
Lead Channel Setting Pacing Pulse Width: 0.5 ms
Lead Channel Setting Sensing Sensitivity: 4 mV
Pulse Gen Model: 2272
Pulse Gen Serial Number: 9190527

## 2023-08-29 NOTE — Progress Notes (Signed)
 Remote pacemaker transmission.

## 2023-10-10 ENCOUNTER — Ambulatory Visit: Payer: Medicare Other

## 2023-10-20 ENCOUNTER — Ambulatory Visit (INDEPENDENT_AMBULATORY_CARE_PROVIDER_SITE_OTHER): Payer: Medicare Other

## 2023-10-20 DIAGNOSIS — I442 Atrioventricular block, complete: Secondary | ICD-10-CM

## 2023-10-24 LAB — CUP PACEART REMOTE DEVICE CHECK
Battery Remaining Longevity: 70 mo
Battery Remaining Percentage: 59 %
Battery Voltage: 3.01 V
Brady Statistic AP VP Percent: 53 %
Brady Statistic AP VS Percent: 1 %
Brady Statistic AS VP Percent: 46 %
Brady Statistic AS VS Percent: 1 %
Brady Statistic RA Percent Paced: 53 %
Brady Statistic RV Percent Paced: 99 %
Date Time Interrogation Session: 20250501125054
Implantable Lead Connection Status: 753985
Implantable Lead Connection Status: 753985
Implantable Lead Implant Date: 20101229
Implantable Lead Implant Date: 20101229
Implantable Lead Location: 753859
Implantable Lead Location: 753860
Implantable Pulse Generator Implant Date: 20201221
Lead Channel Impedance Value: 460 Ohm
Lead Channel Impedance Value: 490 Ohm
Lead Channel Pacing Threshold Amplitude: 0.625 V
Lead Channel Pacing Threshold Amplitude: 0.875 V
Lead Channel Pacing Threshold Pulse Width: 0.5 ms
Lead Channel Pacing Threshold Pulse Width: 0.5 ms
Lead Channel Sensing Intrinsic Amplitude: 12 mV
Lead Channel Sensing Intrinsic Amplitude: 4.4 mV
Lead Channel Setting Pacing Amplitude: 0.875
Lead Channel Setting Pacing Amplitude: 1.875
Lead Channel Setting Pacing Pulse Width: 0.5 ms
Lead Channel Setting Sensing Sensitivity: 4 mV
Pulse Gen Model: 2272
Pulse Gen Serial Number: 9190527

## 2023-10-26 ENCOUNTER — Encounter: Payer: Self-pay | Admitting: Internal Medicine

## 2023-11-03 NOTE — Progress Notes (Signed)
 [facility] - Clinic Note  11/12/2023     CHIEF COMPLAINT Patient presents for Retina Follow Up   HISTORY OF PRESENT ILLNESS: [name] [name] is a 78 y.o. male who presents to the clinic today for:   HPI     Retina Follow Up   Patient presents with  CRVO/BRVO.  In right eye.  Severity is moderate.  Duration of 9 months.  Since onset it is stable.  I, the attending physician,  performed the HPI with the patient and updated documentation appropriately.        Comments   Pt here for 9 mo ret f/u BRVO OD. Pt states [place] has become a bit more difficult in terms of seeing in the distance as well as reading. Pt did see his regular eye doc a few weeks ago and he was started on OTC gtts for dryness. Pt states he doesn't see much change but it may be too early to tell. His doctor didn't feel he needed to change his rx specs.       Last edited by Baylen Buckner, MD on 11/16/2023  1:39 AM.    Pt states vision is doing well, he saw Dr. Micael Adas and got a good report, he does not need cataract sx yet  Referring physician: Augustus Blood, DO [address] [place],  [place] [address]  HISTORICAL INFORMATION:   Selected notes from the MEDICAL RECORD NUMBER Referred by Dr. Jordan DeMarco for concern of vein occlusion OD LEE:  Ocular Hx- PMH-   CURRENT MEDICATIONS: No current outpatient medications on file. (Ophthalmic Drugs)   No current facility-administered medications for this visit. (Ophthalmic Drugs)   Current Outpatient Medications (Other)  Medication Sig   acetaminophen  (TYLENOL ) 500 MG tablet Take 500-1,000 mg by mouth every 6 (six) hours as needed for moderate pain or headache.   Cyanocobalamin (B-12 PO) Take 1 capsule by mouth daily.   finasteride (PROSCAR) 5 MG tablet Take 5 mg by mouth daily.   levothyroxine (SYNTHROID, LEVOTHROID) 100 MCG tablet TAKE 1 TABLET BY MOUTH ONCE DAILY   MELATONIN PO Take 1 tablet by mouth at bedtime.   Multiple  Vitamin (MULTIVITAMIN WITH MINERALS) TABS tablet Take 1 tablet by mouth daily.   pravastatin (PRAVACHOL) 40 MG tablet TAKE 1 TABLET BY MOUTH EVERY DAY   sertraline  (ZOLOFT ) 100 MG tablet Take 1.5 tablets (150 mg total) by mouth daily.   sildenafil (VIAGRA) 100 MG tablet Take 50-100 mg by mouth daily as needed.   tamsulosin (FLOMAX) 0.4 MG CAPS capsule Take by mouth.   VITAMIN D PO Take 1 capsule by mouth daily.   No current facility-administered medications for this visit. (Other)   REVIEW OF SYSTEMS: ROS   Positive for: Genitourinary, Cardiovascular, Eyes Negative for: Constitutional, Gastrointestinal, Neurological, Skin, Musculoskeletal, HENT, Endocrine, Respiratory, Psychiatric, Allergic/Imm, Heme/Lymph Last edited by Anthony Bateman, [name] on 11/12/2023  8:13 AM.      ALLERGIES Allergies  Allergen Reactions   Penicillins     Did it involve swelling of the face/tongue/throat, SOB, or low BP? No Did it involve sudden or severe rash/hives, skin peeling, or any reaction on the inside of your mouth or nose? No Did you need to seek medical attention at a hospital or doctor's office? Unknown When did it last happen?      Childhood allergy If all above answers are "NO", may proceed with cephalosporin use.    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   Cataract    Mixed form OU   Chronic kidney disease 02/21/2022   Depression 02/21/2022   History of skin cancer 04/29/2017   History of skin cancer 04/29/2017   Hyperlipidemia 02/21/2022   Hypertensive retinopathy    OU   Hypothyroidism 04/29/2017   Itching 04/29/2017   Scabies 05/28/2017   Past Surgical History:  Procedure Laterality Date   PPM GENERATOR CHANGEOUT N/A 06/14/2019   Procedure: PPM GENERATOR CHANGEOUT;  Surgeon: Tammie Fall, MD;  Location: [facility];  Service: Cardiovascular;   Laterality: N/A;   FAMILY HISTORY Family History  Problem Relation Age of Onset   Hypertension Brother    SOCIAL HISTORY Social History   Tobacco Use   Smoking status: Never    Passive exposure: Never   Smokeless tobacco: Never  Vaping Use   Vaping status: Never Used  Substance Use Topics   Alcohol use: Yes   Drug use: No       OPHTHALMIC EXAM: Base Eye Exam     Visual Acuity (Snellen - Linear)       Right Left   Dist [place] 20/20 -2 20/20 -2         Tonometry (Tonopen, 8:19 AM)       Right Left   Pressure 17 18         Pupils       Pupils Dark Light Shape React APD   Right PERRL 3 2 Round Brisk None   Left PERRL 3 2 Round Brisk None         Visual Fields (Counting fingers)       Left Right    Full Full         Extraocular Movement       Right Left    Full, Ortho Full, Ortho         Neuro/Psych     Oriented x3: Yes   Mood/Affect: Normal         Dilation     Both eyes: 1.0% Mydriacyl, 2.5% Phenylephrine @ 8:20 AM           Slit Lamp and Fundus Exam     Slit Lamp Exam       Right Left   Lids/Lashes Dermatochalasis - upper lid Dermatochalasis - upper lid, mild MGD   Conjunctiva/Sclera White and quiet White and quiet   Cornea trace tear film debris trace tear film debris   Anterior Chamber deep and clear Deep and quiet   Iris Round and dilated Round and dilated   Lens 2-3+ Nuclear sclerosis with mild brunescence, 2-3+ Cortical cataract 2-3+ Nuclear sclerosis with mild brunescence, 2-3+ Cortical cataract   Anterior Vitreous mild syneresis, Posterior vitreous detachment, vitreous condensations Vitreous syneresis, Posterior vitreous detachment         Fundus Exam       Right Left   Disc Pink and Sharp Pink and sharp, mild PPA, mild, focal PPP temporal   C/D Ratio 0.6 0.6   Macula Flat, good foveal reflex, trace cystic changes/edema IT to fovea -- stably improved, prominent MA's temporal and inferior to fovea -- stably  improved, mild focal laser changes, no heme, mild ERM Flat, good foveal reflex, retinal pigment epithelial mottling, No heme or edema   Vessels attenuated, Tortuous attenuated, mild tortuosity   Periphery Attached, no heme; pigmented operculated hole at 0700 midzone --  no SRF -- good laser surrounding, no new RT/RD Attached, no RT/RD           IMAGING AND PROCEDURES  Imaging and Procedures for @TODAY @  OCT, Retina - OU - Both Eyes        Right Eye Quality was good. Central Foveal Thickness: 335. Progression has been stable. Findings include normal foveal contour, no IRF, no SRF (stable improvement in IRF/cystic changes inferior fovea and macula).   Left Eye Quality was good. Central Foveal Thickness: 326. Progression has been stable. Findings include normal foveal contour, no IRF, no SRF (stable release of partial PVD).   Notes  *Images captured and stored on drive  Diagnosis / Impression:  OD: BRVO w/ stable improvement in IRF/cystic changes inferior fovea and macula OS: NFP, no IRF/SRF, stable release of partial PVD  Clinical management:  See below  Abbreviations: NFP - Normal foveal profile. CME - cystoid macular edema. PED - pigment epithelial detachment. IRF - intraretinal fluid. SRF - subretinal fluid. EZ - ellipsoid zone. ERM - epiretinal membrane. ORA - outer retinal atrophy. ORT - outer retinal tubulation. SRHM - subretinal hyper-reflective material            ASSESSMENT/PLAN:    ICD-10-CM   1. Branch retinal vein occlusion of right eye with macular edema  H34.8310 OCT, Retina - OU - Both Eyes    2. Essential hypertension  I10     3. Hypertensive retinopathy of both eyes  H35.033     4. Retinal hole of right eye  H33.321     5. Posterior vitreous detachment of both eyes  H43.813     6. Combined forms of age-related cataract of both eyes  H25.813      1. BRVO with CME OD  - FA 5.14.21 shows +focal telangectasias with late leakage inferior macula  consistent with remote inf BRVO  - S/P IVA OD #1 (05.14.21), #2 (06.14.21), #3 (07.16.21) -- IVA resistance  - s/p IVE OD #1 (08.18.21) -- sample, #2 (09.15.21), #3 (10.18.21), #4 (11.17.21), #5 (12.15.21), #6 (01.14.22), #7 (02.11.22), #8 (03.18.22), #9 (04.15.22), #10 (06.10.22), #11 (07.22.22), #12 (09.09.22), #13 (11.04.22), #14 (12.30.22), #15 (02.24.23), #16 (08.23.23), #17 (11.27.23)  - s/p focal laser OD (05.13.22)  **history of recurrent IRF/cystic changes at 6 mos (2.24.23 - 08.23.23)  - BCVA 20/20 OD (stable)  - OCT shows BRVO w/ stable improvement in cystic changes at 18 months since last injection  - recommend holding IVE today w/ f/u in 9-12 mos -- will treat prn  - pt in agreement  - [employer]  informed consent form re-signed and scanned on 05.10.2023  - [email or web address] benefit verified for 2024  - F/U 9-12 months, sooner prn -- DFE/OCT  2,3. Hypertensive retinopathy OU  - discussed importance of tight BP control  - monitor   4. 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 PVD OU  - OD: long standing, asymptomatic  - OS: interval release of partial PVD on OCT--noted on 08.02.23, asymptomatic  - Discussed findings and prognosis  - No RT or RD on 360 peripheral exam OS  - Reviewed s/s of RT/RD  - Strict return precautions for any such RT/RD signs/symptoms  - monitor  6. Mixed form cataracts OU  -  The symptoms of cataract, surgical options, and treatments and risks were discussed with patient.  - discussed diagnosis and progression  - under the expert management of [facility]  - pt had appt with Dr. Micael Adas, but has decided to hold off on surgery for now  Ophthalmic Meds Ordered this visit:  No orders of the defined types were placed in this encounter.     Return for f/u 9-12 months, BRVO OD, DFE, OCT.  This document serves as a record of services personally performed by Jeanice Millard, MD, PhD. It was created on their behalf by Angelia Kelp, an ophthalmic technician. The creation of this record is the provider's dictation and/or activities during the visit.    Electronically signed by: Angelia Kelp, OA, 11/16/23  1:41 AM  This document serves as a record of services personally performed by Jeanice Millard, MD, PhD. It was created on their behalf by Morley Arabia. Bevin Bucks, OA an ophthalmic technician. The creation of this record is the provider's dictation and/or activities during the visit.    Electronically signed by: Morley Arabia. Bevin Bucks, OA 11/16/23 1:41 AM  Jeanice Millard, M.D., Ph.D. Diseases & Surgery of the Retina and Vitreous [facility] 11/12/2023   I have reviewed the above documentation for accuracy and completeness, and I agree with the above. Jeanice Millard, M.D., Ph.D. 11/16/23 1:41 AM   Abbreviations: M myopia (nearsighted); A astigmatism; H hyperopia (farsighted); P presbyopia; Mrx spectacle prescription;  CTL contact lenses; OD right eye; OS left eye; OU both eyes  XT exotropia; ET esotropia; PEK punctate epithelial keratitis; PEE punctate epithelial erosions; DES dry eye syndrome; MGD meibomian gland dysfunction; ATs artificial tears; PFAT's preservative free artificial tears; NSC nuclear sclerotic cataract; PSC posterior subcapsular cataract; ERM epi-retinal membrane; PVD posterior vitreous detachment; RD retinal detachment; DM diabetes mellitus; DR diabetic retinopathy; NPDR non-proliferative diabetic retinopathy; PDR proliferative diabetic retinopathy; CSME clinically significant macular edema; DME diabetic macular edema; dbh dot blot hemorrhages; CWS cotton wool spot; POAG primary open angle glaucoma; C/D cup-to-disc ratio; HVF humphrey visual field; GVF goldmann visual field; OCT optical coherence  tomography; IOP intraocular pressure; BRVO Branch retinal vein occlusion; CRVO central retinal vein occlusion; CRAO central retinal artery occlusion; BRAO branch retinal artery occlusion; RT retinal tear; SB scleral buckle; PPV pars plana vitrectomy; VH Vitreous hemorrhage; PRP panretinal laser photocoagulation; IVK intravitreal kenalog ; VMT vitreomacular traction; MH Macular hole;  NVD neovascularization of the disc; NVE neovascularization elsewhere; AREDS age related eye disease study; ARMD age related macular degeneration; POAG primary open angle glaucoma; EBMD epithelial/anterior basement membrane dystrophy; ACIOL anterior chamber intraocular lens; IOL intraocular lens; PCIOL posterior chamber intraocular lens; Phaco/IOL phacoemulsification with intraocular lens placement; PRK photorefractive keratectomy; LASIK laser assisted in situ keratomileusis; HTN hypertension; DM diabetes mellitus; COPD chronic obstructive pulmonary disease

## 2023-11-12 ENCOUNTER — Encounter (INDEPENDENT_AMBULATORY_CARE_PROVIDER_SITE_OTHER): Payer: Self-pay | Admitting: Ophthalmology

## 2023-11-12 ENCOUNTER — Ambulatory Visit (INDEPENDENT_AMBULATORY_CARE_PROVIDER_SITE_OTHER): Admitting: Ophthalmology

## 2023-11-12 DIAGNOSIS — H34831 Tributary (branch) retinal vein occlusion, right eye, with macular edema: Secondary | ICD-10-CM | POA: Diagnosis not present

## 2023-11-12 DIAGNOSIS — I10 Essential (primary) hypertension: Secondary | ICD-10-CM

## 2023-11-12 DIAGNOSIS — H35033 Hypertensive retinopathy, bilateral: Secondary | ICD-10-CM | POA: Diagnosis not present

## 2023-11-12 DIAGNOSIS — H33321 Round hole, right eye: Secondary | ICD-10-CM | POA: Diagnosis not present

## 2023-11-12 DIAGNOSIS — H25813 Combined forms of age-related cataract, bilateral: Secondary | ICD-10-CM

## 2023-11-12 DIAGNOSIS — H43813 Vitreous degeneration, bilateral: Secondary | ICD-10-CM | POA: Diagnosis not present

## 2023-11-14 ENCOUNTER — Encounter (INDEPENDENT_AMBULATORY_CARE_PROVIDER_SITE_OTHER): Payer: Medicare Other | Admitting: Ophthalmology

## 2023-11-16 ENCOUNTER — Encounter (INDEPENDENT_AMBULATORY_CARE_PROVIDER_SITE_OTHER): Payer: Self-pay | Admitting: Ophthalmology

## 2023-12-09 NOTE — Addendum Note (Signed)
 Addended by: Edra Govern D on: 12/09/2023 02:59 PM   Modules accepted: Orders

## 2023-12-09 NOTE — Progress Notes (Signed)
 Remote pacemaker transmission.

## 2024-01-09 ENCOUNTER — Ambulatory Visit: Payer: Medicare Other

## 2024-01-19 ENCOUNTER — Ambulatory Visit (INDEPENDENT_AMBULATORY_CARE_PROVIDER_SITE_OTHER): Payer: Medicare Other

## 2024-01-19 DIAGNOSIS — I442 Atrioventricular block, complete: Secondary | ICD-10-CM | POA: Diagnosis not present

## 2024-01-20 LAB — CUP PACEART REMOTE DEVICE CHECK
Battery Remaining Longevity: 67 mo
Battery Remaining Percentage: 56 %
Battery Voltage: 3.01 V
Brady Statistic AP VP Percent: 52 %
Brady Statistic AP VS Percent: 1 %
Brady Statistic AS VP Percent: 48 %
Brady Statistic AS VS Percent: 1 %
Brady Statistic RA Percent Paced: 52 %
Brady Statistic RV Percent Paced: 99 %
Date Time Interrogation Session: 20250728020021
Implantable Lead Connection Status: 753985
Implantable Lead Connection Status: 753985
Implantable Lead Implant Date: 20101229
Implantable Lead Implant Date: 20101229
Implantable Lead Location: 753859
Implantable Lead Location: 753860
Implantable Pulse Generator Implant Date: 20201221
Lead Channel Impedance Value: 490 Ohm
Lead Channel Impedance Value: 530 Ohm
Lead Channel Pacing Threshold Amplitude: 0.625 V
Lead Channel Pacing Threshold Amplitude: 0.875 V
Lead Channel Pacing Threshold Pulse Width: 0.5 ms
Lead Channel Pacing Threshold Pulse Width: 0.5 ms
Lead Channel Sensing Intrinsic Amplitude: 12 mV
Lead Channel Sensing Intrinsic Amplitude: 2.7 mV
Lead Channel Setting Pacing Amplitude: 0.875
Lead Channel Setting Pacing Amplitude: 1.875
Lead Channel Setting Pacing Pulse Width: 0.5 ms
Lead Channel Setting Sensing Sensitivity: 4 mV
Pulse Gen Model: 2272
Pulse Gen Serial Number: 9190527

## 2024-01-23 ENCOUNTER — Ambulatory Visit: Payer: Self-pay | Admitting: Internal Medicine

## 2024-03-25 NOTE — Progress Notes (Signed)
 Remote PPM Transmission

## 2024-04-19 ENCOUNTER — Ambulatory Visit (INDEPENDENT_AMBULATORY_CARE_PROVIDER_SITE_OTHER): Payer: Medicare Other

## 2024-04-19 DIAGNOSIS — I442 Atrioventricular block, complete: Secondary | ICD-10-CM

## 2024-04-22 LAB — CUP PACEART REMOTE DEVICE CHECK
Battery Remaining Longevity: 65 mo
Battery Remaining Percentage: 54 %
Battery Voltage: 3.01 V
Brady Statistic AP VP Percent: 52 %
Brady Statistic AP VS Percent: 1 %
Brady Statistic AS VP Percent: 48 %
Brady Statistic AS VS Percent: 1 %
Brady Statistic RA Percent Paced: 52 %
Brady Statistic RV Percent Paced: 99 %
Date Time Interrogation Session: 20251029141036
Implantable Lead Connection Status: 753985
Implantable Lead Connection Status: 753985
Implantable Lead Implant Date: 20101229
Implantable Lead Implant Date: 20101229
Implantable Lead Location: 753859
Implantable Lead Location: 753860
Implantable Pulse Generator Implant Date: 20201221
Lead Channel Impedance Value: 480 Ohm
Lead Channel Impedance Value: 540 Ohm
Lead Channel Pacing Threshold Amplitude: 0.5 V
Lead Channel Pacing Threshold Amplitude: 0.875 V
Lead Channel Pacing Threshold Pulse Width: 0.5 ms
Lead Channel Pacing Threshold Pulse Width: 0.5 ms
Lead Channel Sensing Intrinsic Amplitude: 12 mV
Lead Channel Sensing Intrinsic Amplitude: 3 mV
Lead Channel Setting Pacing Amplitude: 0.75 V
Lead Channel Setting Pacing Amplitude: 1.875
Lead Channel Setting Pacing Pulse Width: 0.5 ms
Lead Channel Setting Sensing Sensitivity: 4 mV
Pulse Gen Model: 2272
Pulse Gen Serial Number: 9190527

## 2024-04-27 NOTE — Progress Notes (Signed)
 Remote PPM Transmission

## 2024-04-29 ENCOUNTER — Ambulatory Visit: Payer: Self-pay | Admitting: Internal Medicine

## 2024-07-19 ENCOUNTER — Ambulatory Visit: Payer: Medicare Other

## 2024-07-19 DIAGNOSIS — I442 Atrioventricular block, complete: Secondary | ICD-10-CM | POA: Diagnosis not present

## 2024-07-21 LAB — CUP PACEART REMOTE DEVICE CHECK
Battery Remaining Longevity: 61 mo
Battery Remaining Percentage: 51 %
Battery Voltage: 2.99 V
Brady Statistic AP VP Percent: 52 %
Brady Statistic AP VS Percent: 1 %
Brady Statistic AS VP Percent: 47 %
Brady Statistic AS VS Percent: 1 %
Brady Statistic RA Percent Paced: 52 %
Brady Statistic RV Percent Paced: 99 %
Date Time Interrogation Session: 20260127100244
Implantable Lead Connection Status: 753985
Implantable Lead Connection Status: 753985
Implantable Lead Implant Date: 20101229
Implantable Lead Implant Date: 20101229
Implantable Lead Location: 753859
Implantable Lead Location: 753860
Implantable Pulse Generator Implant Date: 20201221
Lead Channel Impedance Value: 480 Ohm
Lead Channel Impedance Value: 510 Ohm
Lead Channel Pacing Threshold Amplitude: 0.5 V
Lead Channel Pacing Threshold Amplitude: 0.875 V
Lead Channel Pacing Threshold Pulse Width: 0.5 ms
Lead Channel Pacing Threshold Pulse Width: 0.5 ms
Lead Channel Sensing Intrinsic Amplitude: 12 mV
Lead Channel Sensing Intrinsic Amplitude: 2.9 mV
Lead Channel Setting Pacing Amplitude: 0.75 V
Lead Channel Setting Pacing Amplitude: 1.875
Lead Channel Setting Pacing Pulse Width: 0.5 ms
Lead Channel Setting Sensing Sensitivity: 4 mV
Pulse Gen Model: 2272
Pulse Gen Serial Number: 9190527

## 2024-07-22 ENCOUNTER — Ambulatory Visit: Payer: Self-pay | Admitting: Cardiovascular Disease

## 2024-07-26 NOTE — Progress Notes (Signed)
 Remote PPM Transmission

## 2024-09-08 ENCOUNTER — Encounter (INDEPENDENT_AMBULATORY_CARE_PROVIDER_SITE_OTHER): Admitting: Ophthalmology

## 2024-10-18 ENCOUNTER — Ambulatory Visit: Payer: Medicare Other
